# Patient Record
Sex: Female | Born: 1940 | Race: White | Hispanic: No | Marital: Single | Smoking: Former smoker
Health system: Southern US, Community
[De-identification: ages and names within clinical notes are randomized; demographics above are authoritative.]

## PROBLEM LIST (undated history)

## (undated) DIAGNOSIS — M8580 Other specified disorders of bone density and structure, unspecified site: Secondary | ICD-10-CM

## (undated) DIAGNOSIS — F329 Major depressive disorder, single episode, unspecified: Secondary | ICD-10-CM

## (undated) DIAGNOSIS — I10 Essential (primary) hypertension: Secondary | ICD-10-CM

## (undated) DIAGNOSIS — K219 Gastro-esophageal reflux disease without esophagitis: Secondary | ICD-10-CM

## (undated) DIAGNOSIS — C801 Malignant (primary) neoplasm, unspecified: Secondary | ICD-10-CM

## (undated) DIAGNOSIS — I639 Cerebral infarction, unspecified: Secondary | ICD-10-CM

## (undated) DIAGNOSIS — M1990 Unspecified osteoarthritis, unspecified site: Secondary | ICD-10-CM

## (undated) DIAGNOSIS — IMO0001 Reserved for inherently not codable concepts without codable children: Secondary | ICD-10-CM

## (undated) DIAGNOSIS — F32A Depression, unspecified: Secondary | ICD-10-CM

## (undated) DIAGNOSIS — D699 Hemorrhagic condition, unspecified: Secondary | ICD-10-CM

## (undated) DIAGNOSIS — R011 Cardiac murmur, unspecified: Secondary | ICD-10-CM

## (undated) HISTORY — PX: CATARACT EXTRACTION: SUR2

## (undated) HISTORY — PX: OTHER SURGICAL HISTORY: SHX169

## (undated) HISTORY — PX: BUNIONECTOMY: SHX129

## (undated) HISTORY — PX: CARDIAC CATHETERIZATION: SHX172

## (undated) HISTORY — PX: TONSILLECTOMY: SUR1361

---

## 1997-11-01 ENCOUNTER — Other Ambulatory Visit: Admission: RE | Admit: 1997-11-01 | Discharge: 1997-11-01 | Payer: Self-pay | Admitting: Obstetrics and Gynecology

## 1999-11-25 ENCOUNTER — Encounter: Payer: Self-pay | Admitting: Obstetrics and Gynecology

## 1999-11-25 ENCOUNTER — Encounter: Admission: RE | Admit: 1999-11-25 | Discharge: 1999-11-25 | Payer: Self-pay | Admitting: Obstetrics and Gynecology

## 2000-04-20 ENCOUNTER — Ambulatory Visit (HOSPITAL_COMMUNITY): Admission: RE | Admit: 2000-04-20 | Discharge: 2000-04-20 | Payer: Self-pay | Admitting: Gastroenterology

## 2001-09-13 ENCOUNTER — Other Ambulatory Visit: Admission: RE | Admit: 2001-09-13 | Discharge: 2001-09-13 | Payer: Self-pay | Admitting: Gynecology

## 2001-10-11 ENCOUNTER — Encounter: Payer: Self-pay | Admitting: Gynecology

## 2001-10-11 ENCOUNTER — Encounter: Admission: RE | Admit: 2001-10-11 | Discharge: 2001-10-11 | Payer: Self-pay | Admitting: Gynecology

## 2002-05-27 ENCOUNTER — Encounter: Payer: Self-pay | Admitting: Emergency Medicine

## 2002-05-27 ENCOUNTER — Emergency Department (HOSPITAL_COMMUNITY): Admission: EM | Admit: 2002-05-27 | Discharge: 2002-05-27 | Payer: Self-pay | Admitting: Emergency Medicine

## 2002-08-19 ENCOUNTER — Ambulatory Visit: Admission: RE | Admit: 2002-08-19 | Discharge: 2002-08-19 | Payer: Self-pay | Admitting: Family Medicine

## 2002-08-19 ENCOUNTER — Encounter: Payer: Self-pay | Admitting: Family Medicine

## 2002-08-29 ENCOUNTER — Other Ambulatory Visit: Admission: RE | Admit: 2002-08-29 | Discharge: 2002-08-29 | Payer: Self-pay | Admitting: Gynecology

## 2002-09-21 ENCOUNTER — Encounter: Payer: Self-pay | Admitting: Vascular Surgery

## 2002-09-21 ENCOUNTER — Ambulatory Visit (HOSPITAL_COMMUNITY): Admission: RE | Admit: 2002-09-21 | Discharge: 2002-09-21 | Payer: Self-pay | Admitting: Vascular Surgery

## 2003-10-09 ENCOUNTER — Other Ambulatory Visit: Admission: RE | Admit: 2003-10-09 | Discharge: 2003-10-09 | Payer: Self-pay | Admitting: Gynecology

## 2004-03-04 ENCOUNTER — Ambulatory Visit: Admission: RE | Admit: 2004-03-04 | Discharge: 2004-03-04 | Payer: Self-pay | Admitting: Family Medicine

## 2004-10-14 ENCOUNTER — Other Ambulatory Visit: Admission: RE | Admit: 2004-10-14 | Discharge: 2004-10-14 | Payer: Self-pay | Admitting: Gynecology

## 2005-10-06 ENCOUNTER — Other Ambulatory Visit: Admission: RE | Admit: 2005-10-06 | Discharge: 2005-10-06 | Payer: Self-pay | Admitting: Gynecology

## 2006-10-12 ENCOUNTER — Other Ambulatory Visit: Admission: RE | Admit: 2006-10-12 | Discharge: 2006-10-12 | Payer: Self-pay | Admitting: Gynecology

## 2009-12-21 ENCOUNTER — Encounter: Admission: RE | Admit: 2009-12-21 | Discharge: 2009-12-21 | Payer: Self-pay | Admitting: Gynecology

## 2010-11-27 ENCOUNTER — Other Ambulatory Visit: Payer: Self-pay | Admitting: Gynecology

## 2010-11-27 DIAGNOSIS — Z1231 Encounter for screening mammogram for malignant neoplasm of breast: Secondary | ICD-10-CM

## 2010-12-26 ENCOUNTER — Ambulatory Visit
Admission: RE | Admit: 2010-12-26 | Discharge: 2010-12-26 | Disposition: A | Discharge status: Home / Self Care | Payer: BC Managed Care – PPO | Source: Ambulatory Visit | Attending: Gynecology | Admitting: Gynecology

## 2010-12-26 DIAGNOSIS — Z1231 Encounter for screening mammogram for malignant neoplasm of breast: Secondary | ICD-10-CM

## 2010-12-31 ENCOUNTER — Other Ambulatory Visit: Payer: Self-pay | Admitting: Gynecology

## 2010-12-31 DIAGNOSIS — R928 Other abnormal and inconclusive findings on diagnostic imaging of breast: Secondary | ICD-10-CM

## 2011-01-20 ENCOUNTER — Ambulatory Visit
Admission: RE | Admit: 2011-01-20 | Discharge: 2011-01-20 | Disposition: A | Discharge status: Home / Self Care | Payer: BC Managed Care – PPO | Source: Ambulatory Visit | Attending: Gynecology | Admitting: Gynecology

## 2011-01-20 ENCOUNTER — Ambulatory Visit
Admission: RE | Admit: 2011-01-20 | Discharge: 2011-01-20 | Disposition: A | Discharge status: Home / Self Care | Payer: Medicare Other | Source: Ambulatory Visit | Attending: Gynecology | Admitting: Gynecology

## 2011-01-20 DIAGNOSIS — R928 Other abnormal and inconclusive findings on diagnostic imaging of breast: Secondary | ICD-10-CM

## 2012-01-12 ENCOUNTER — Other Ambulatory Visit: Payer: Self-pay | Admitting: Family Medicine

## 2012-01-12 DIAGNOSIS — Z1231 Encounter for screening mammogram for malignant neoplasm of breast: Secondary | ICD-10-CM

## 2012-01-13 ENCOUNTER — Ambulatory Visit
Admission: RE | Admit: 2012-01-13 | Discharge: 2012-01-13 | Disposition: A | Discharge status: Home / Self Care | Payer: Medicare Other | Source: Ambulatory Visit | Attending: Family Medicine | Admitting: Family Medicine

## 2012-01-13 DIAGNOSIS — Z1231 Encounter for screening mammogram for malignant neoplasm of breast: Secondary | ICD-10-CM

## 2012-12-15 ENCOUNTER — Other Ambulatory Visit: Payer: Self-pay

## 2012-12-15 DIAGNOSIS — Z1231 Encounter for screening mammogram for malignant neoplasm of breast: Secondary | ICD-10-CM

## 2013-01-13 ENCOUNTER — Ambulatory Visit
Admission: RE | Admit: 2013-01-13 | Discharge: 2013-01-13 | Disposition: A | Discharge status: Home / Self Care | Payer: Medicare Other | Source: Ambulatory Visit

## 2013-01-13 DIAGNOSIS — Z1231 Encounter for screening mammogram for malignant neoplasm of breast: Secondary | ICD-10-CM

## 2013-09-20 ENCOUNTER — Other Ambulatory Visit: Payer: Self-pay | Admitting: Orthopedic Surgery

## 2013-09-28 ENCOUNTER — Encounter (HOSPITAL_COMMUNITY): Payer: Self-pay | Admitting: Pharmacy Technician

## 2013-09-30 ENCOUNTER — Inpatient Hospital Stay (HOSPITAL_COMMUNITY)
Admission: RE | Admit: 2013-09-30 | Discharge: 2013-09-30 | Disposition: A | Discharge status: Home / Self Care | Payer: Medicare Other | Source: Ambulatory Visit

## 2013-09-30 ENCOUNTER — Encounter (HOSPITAL_COMMUNITY): Payer: Self-pay

## 2013-09-30 HISTORY — DX: Essential (primary) hypertension: I10

## 2013-09-30 HISTORY — DX: Malignant (primary) neoplasm, unspecified: C80.1

## 2013-09-30 HISTORY — DX: Other specified disorders of bone density and structure, unspecified site: M85.80

## 2013-09-30 HISTORY — DX: Cerebral infarction, unspecified: I63.9

## 2013-09-30 HISTORY — DX: Depression, unspecified: F32.A

## 2013-09-30 HISTORY — DX: Major depressive disorder, single episode, unspecified: F32.9

## 2013-09-30 NOTE — Pre-Procedure Instructions (Signed)
EKG, MEDICAL CLEARANCE AND OFFICE NOTE 09/05/13  DR. CYNTHIA WHITE ON PT'S CHART.

## 2013-10-03 ENCOUNTER — Encounter (HOSPITAL_COMMUNITY): Payer: Self-pay

## 2013-10-03 ENCOUNTER — Ambulatory Visit (HOSPITAL_COMMUNITY)
Admission: RE | Admit: 2013-10-03 | Discharge: 2013-10-03 | Disposition: A | Discharge status: Home / Self Care | Payer: Medicare Other | Source: Ambulatory Visit | Attending: Orthopedic Surgery | Admitting: Orthopedic Surgery

## 2013-10-03 ENCOUNTER — Encounter (HOSPITAL_COMMUNITY)
Admission: RE | Admit: 2013-10-03 | Discharge: 2013-10-03 | Disposition: A | Discharge status: Home / Self Care | Payer: Medicare Other | Source: Ambulatory Visit | Attending: Orthopedic Surgery | Admitting: Orthopedic Surgery

## 2013-10-03 DIAGNOSIS — Z01818 Encounter for other preprocedural examination: Secondary | ICD-10-CM | POA: Insufficient documentation

## 2013-10-03 DIAGNOSIS — Z01812 Encounter for preprocedural laboratory examination: Secondary | ICD-10-CM | POA: Insufficient documentation

## 2013-10-03 HISTORY — DX: Unspecified osteoarthritis, unspecified site: M19.90

## 2013-10-03 HISTORY — DX: Cardiac murmur, unspecified: R01.1

## 2013-10-03 LAB — BASIC METABOLIC PANEL
BUN: 18 mg/dL (ref 6–23)
CHLORIDE: 102 meq/L (ref 96–112)
CO2: 28 mEq/L (ref 19–32)
CREATININE: 0.88 mg/dL (ref 0.50–1.10)
Calcium: 9.6 mg/dL (ref 8.4–10.5)
GFR, EST AFRICAN AMERICAN: 74 mL/min — AB (ref >90)
GFR, EST NON AFRICAN AMERICAN: 64 mL/min — AB (ref >90)
Glucose, Bld: 100 mg/dL — ABNORMAL HIGH (ref 70–99)
POTASSIUM: 3.4 meq/L — AB (ref 3.7–5.3)
Sodium: 142 mEq/L (ref 137–147)

## 2013-10-03 LAB — CBC
HEMATOCRIT: 39.6 % (ref 36.0–46.0)
HEMOGLOBIN: 13.5 g/dL (ref 12.0–15.0)
MCH: 33.3 pg (ref 26.0–34.0)
MCHC: 34.1 g/dL (ref 30.0–36.0)
MCV: 97.5 fL (ref 78.0–100.0)
Platelets: 213 10*3/uL (ref 150–400)
RBC: 4.06 MIL/uL (ref 3.87–5.11)
RDW: 12.2 % (ref 11.5–15.5)
WBC: 4.6 10*3/uL (ref 4.0–10.5)

## 2013-10-03 NOTE — Patient Instructions (Signed)
      Your procedure is scheduled on:  10/05/13  [facility]  Report to [place] at  1030     AM.  Call this number if you have problems the morning of surgery: [phone]        Do not eat food  :After Midnight. Tuesday NIGHT       May have Clear liquids until 0700 AM  Wednesday morning then nothing   Take these medicines the morning of surgery with A SIP OF WATER: Citalopram   .  Contacts, dentures or partial plates, or metal hairpins  can not be worn to surgery. Your family will be responsible for glasses, dentures, hearing aides while you are in surgery  Leave suitcase in the car. After surgery it may be brought to your room.  For patients admitted to the hospital, checkout time is 11:00 AM day of  discharge.                DO NOT WEAR JEWELRY, LOTIONS, POWDERS, OR PERFUMES.  WOMEN-- DO NOT SHAVE LEGS OR UNDERARMS FOR 48 HOURS BEFORE SHOWERS. MEN MAY SHAVE FACE.  Patients discharged the day of surgery will not be allowed to drive home. IF going home the day of surgery, you must have a driver and someone to stay with you for the first 24 hours  Name and phone number of your driver:    Corinne Ports  daughter                                                                    Please read over the following fact sheets that you were given:  Incentive Spirometry Sheet                   FAILURE TO FOLLOW THESE INSTRUCTIONS MAY RESULT IN  CANCELLATION   OF YOUR SURGERY                                                  Patient Signature _____________________________

## 2013-10-04 DIAGNOSIS — S83249A Other tear of medial meniscus, current injury, unspecified knee, initial encounter: Secondary | ICD-10-CM | POA: Diagnosis present

## 2013-10-04 NOTE — H&P (Signed)
  Ebony [name] is a 73 y.o. female who presents with right knee pain.  HPI- . Knee Pain: Patient presents with knee pain involving the  right knee. Onset of the symptoms was several months ago. Inciting event: none known. Current symptoms include giving out, pain located medially, popping sensation, stiffness and swelling. Pain is aggravated by kneeling, lateral movements, pivoting, rising after sitting and squatting.  Patient has had no prior knee problems. Evaluation to date: MRI: abnormal medial meniscal tear. Treatment to date: brace which is somewhat effective.  Past Medical History  Diagnosis Date  . Stroke     TIA 2004- PT IS ON PLAVIX  . Hypertension   . Depression   . Osteopenia   . Right knee meniscal tear   . Cancer     BASAL CELL CA  . Dyslipidemia     STABLE - OFF MEDICATION  . Heart murmur   . Arthritis     Past Surgical History  Procedure Laterality Date  . Mohs surgery on nose 01/2007, mohs on face 08/2012    . Cardiac catheterization    . Tonsillectomy      Prior to Admission medications   Medication Sig Start Date End Date Taking? Authorizing Provider  citalopram (CELEXA) 40 MG tablet Take 40 mg by mouth every morning.    Historical Provider, MD  clopidogrel (PLAVIX) 75 MG tablet Take 75 mg by mouth daily with breakfast.    Historical Provider, MD  triamterene-hydrochlorothiazide (DYAZIDE) 37.5-25 MG per capsule Take 1 capsule by mouth every morning.    Historical Provider, MD   KNEE EXAM antalgic gait, soft tissue tenderness over medial joint line, no effusion, positive pivot-shift, collateral ligaments intact  Physical Examination: General appearance - alert, well appearing, and in no distress Mental status - alert, oriented to person, place, and time Chest - clear to auscultation, no wheezes, rales or rhonchi, symmetric air entry Heart - normal rate, regular rhythm, normal S1, S2, no murmurs, rubs, clicks or gallops Abdomen - soft, nontender,  nondistended, no masses or organomegaly Neurological - alert, oriented, normal speech, no focal findings or movement disorder noted Musculoskeletal - no joint tenderness, deformity or swelling   Asessment/Plan--- Right knee medial meniscal tear- - Plan left knee arthroscopy with meniscal debridement. Procedure risks and potential comps discussed with patient who elects to proceed. Goals are decreased pain and increased function with a high likelihood of achieving both

## 2013-10-05 ENCOUNTER — Encounter (HOSPITAL_COMMUNITY)
Admission: RE | Disposition: A | Discharge status: Home / Self Care | Payer: Self-pay | Source: Ambulatory Visit | Attending: Orthopedic Surgery

## 2013-10-05 ENCOUNTER — Ambulatory Visit (HOSPITAL_COMMUNITY): Payer: Medicare Other | Admitting: Anesthesiology

## 2013-10-05 ENCOUNTER — Encounter (HOSPITAL_COMMUNITY): Payer: Self-pay | Admitting: *Deleted

## 2013-10-05 ENCOUNTER — Ambulatory Visit (HOSPITAL_COMMUNITY)
Admission: RE | Admit: 2013-10-05 | Discharge: 2013-10-05 | Disposition: A | Discharge status: Home / Self Care | Payer: Medicare Other | Source: Ambulatory Visit | Attending: Orthopedic Surgery | Admitting: Orthopedic Surgery

## 2013-10-05 ENCOUNTER — Encounter (HOSPITAL_COMMUNITY): Payer: Medicare Other | Admitting: Anesthesiology

## 2013-10-05 DIAGNOSIS — S83249A Other tear of medial meniscus, current injury, unspecified knee, initial encounter: Secondary | ICD-10-CM

## 2013-10-05 DIAGNOSIS — E785 Hyperlipidemia, unspecified: Secondary | ICD-10-CM | POA: Insufficient documentation

## 2013-10-05 DIAGNOSIS — F329 Major depressive disorder, single episode, unspecified: Secondary | ICD-10-CM | POA: Insufficient documentation

## 2013-10-05 DIAGNOSIS — R011 Cardiac murmur, unspecified: Secondary | ICD-10-CM | POA: Insufficient documentation

## 2013-10-05 DIAGNOSIS — F3289 Other specified depressive episodes: Secondary | ICD-10-CM | POA: Insufficient documentation

## 2013-10-05 DIAGNOSIS — M949 Disorder of cartilage, unspecified: Secondary | ICD-10-CM

## 2013-10-05 DIAGNOSIS — IMO0002 Reserved for concepts with insufficient information to code with codable children: Secondary | ICD-10-CM | POA: Insufficient documentation

## 2013-10-05 DIAGNOSIS — M899 Disorder of bone, unspecified: Secondary | ICD-10-CM | POA: Insufficient documentation

## 2013-10-05 DIAGNOSIS — X58XXXA Exposure to other specified factors, initial encounter: Secondary | ICD-10-CM | POA: Insufficient documentation

## 2013-10-05 DIAGNOSIS — Z7902 Long term (current) use of antithrombotics/antiplatelets: Secondary | ICD-10-CM | POA: Insufficient documentation

## 2013-10-05 DIAGNOSIS — Z8673 Personal history of transient ischemic attack (TIA), and cerebral infarction without residual deficits: Secondary | ICD-10-CM | POA: Insufficient documentation

## 2013-10-05 DIAGNOSIS — I10 Essential (primary) hypertension: Secondary | ICD-10-CM | POA: Insufficient documentation

## 2013-10-05 DIAGNOSIS — Z79899 Other long term (current) drug therapy: Secondary | ICD-10-CM | POA: Insufficient documentation

## 2013-10-05 HISTORY — PX: KNEE ARTHROSCOPY: SHX127

## 2013-10-05 SURGERY — ARTHROSCOPY, KNEE
Anesthesia: General | Site: Knee | Laterality: Right

## 2013-10-05 MED ORDER — ACETAMINOPHEN 10 MG/ML IV SOLN
1000.0000 mg | Freq: Once | INTRAVENOUS | Status: AC
  Administered 2013-10-05: 1000 mg via INTRAVENOUS
  Filled 2013-10-05: qty 100

## 2013-10-05 MED ORDER — FENTANYL CITRATE 0.05 MG/ML IJ SOLN
INTRAMUSCULAR | Status: DC | PRN
  Administered 2013-10-05: 25 ug via INTRAVENOUS
  Administered 2013-10-05 (×2): 50 ug via INTRAVENOUS
  Administered 2013-10-05: 25 ug via INTRAVENOUS
  Administered 2013-10-05: 50 ug via INTRAVENOUS

## 2013-10-05 MED ORDER — ONDANSETRON HCL 4 MG/2ML IJ SOLN
INTRAMUSCULAR | Status: AC
  Filled 2013-10-05: qty 2

## 2013-10-05 MED ORDER — LACTATED RINGERS IV SOLN
INTRAVENOUS | Status: DC
  Administered 2013-10-05: 1000 mL via INTRAVENOUS
  Administered 2013-10-05: 14:00:00 via INTRAVENOUS

## 2013-10-05 MED ORDER — CEFAZOLIN SODIUM-DEXTROSE 2-3 GM-% IV SOLR
INTRAVENOUS | Status: AC
  Filled 2013-10-05: qty 50

## 2013-10-05 MED ORDER — HYDROCODONE-ACETAMINOPHEN 5-325 MG PO TABS
1.0000 | ORAL_TABLET | Freq: Four times a day (QID) | ORAL | Status: DC | PRN
  Administered 2013-10-05: 1 via ORAL
  Filled 2013-10-05: qty 1

## 2013-10-05 MED ORDER — DEXAMETHASONE SODIUM PHOSPHATE 10 MG/ML IJ SOLN
10.0000 mg | Freq: Once | INTRAMUSCULAR | Status: AC
  Administered 2013-10-05: 10 mg via INTRAVENOUS

## 2013-10-05 MED ORDER — FENTANYL CITRATE 0.05 MG/ML IJ SOLN
INTRAMUSCULAR | Status: AC
  Filled 2013-10-05: qty 2

## 2013-10-05 MED ORDER — MEPERIDINE HCL 50 MG/ML IJ SOLN: 6.2500 mg | INTRAMUSCULAR | Status: DC | PRN

## 2013-10-05 MED ORDER — PROPOFOL 10 MG/ML IV BOLUS
INTRAVENOUS | Status: AC
  Filled 2013-10-05: qty 20

## 2013-10-05 MED ORDER — LIDOCAINE HCL (CARDIAC) 20 MG/ML IV SOLN
INTRAVENOUS | Status: AC
  Filled 2013-10-05: qty 5

## 2013-10-05 MED ORDER — HYDROMORPHONE HCL PF 1 MG/ML IJ SOLN
INTRAMUSCULAR | Status: AC
  Filled 2013-10-05: qty 1

## 2013-10-05 MED ORDER — DEXAMETHASONE SODIUM PHOSPHATE 10 MG/ML IJ SOLN
INTRAMUSCULAR | Status: AC
  Filled 2013-10-05: qty 1

## 2013-10-05 MED ORDER — HYDROMORPHONE HCL PF 1 MG/ML IJ SOLN
0.2500 mg | INTRAMUSCULAR | Status: DC | PRN
  Administered 2013-10-05 (×3): 0.5 mg via INTRAVENOUS

## 2013-10-05 MED ORDER — LIDOCAINE HCL (CARDIAC) 20 MG/ML IV SOLN
INTRAVENOUS | Status: DC | PRN
  Administered 2013-10-05: 50 mg via INTRAVENOUS

## 2013-10-05 MED ORDER — HYDROCODONE-ACETAMINOPHEN 5-325 MG PO TABS: 1.0000 | ORAL_TABLET | Freq: Four times a day (QID) | ORAL | Status: DC | PRN

## 2013-10-05 MED ORDER — SODIUM CHLORIDE 0.9 % IR SOLN
Status: DC | PRN
  Administered 2013-10-05: 9000 mL

## 2013-10-05 MED ORDER — GLYCOPYRROLATE 0.2 MG/ML IJ SOLN
INTRAMUSCULAR | Status: AC
  Filled 2013-10-05: qty 1

## 2013-10-05 MED ORDER — PROPOFOL 10 MG/ML IV BOLUS
INTRAVENOUS | Status: DC | PRN
  Administered 2013-10-05: 150 mg via INTRAVENOUS

## 2013-10-05 MED ORDER — PROMETHAZINE HCL 25 MG/ML IJ SOLN: 6.2500 mg | INTRAMUSCULAR | Status: DC | PRN

## 2013-10-05 MED ORDER — CEFAZOLIN SODIUM-DEXTROSE 2-3 GM-% IV SOLR
2.0000 g | INTRAVENOUS | Status: AC
  Administered 2013-10-05: 2 g via INTRAVENOUS

## 2013-10-05 MED ORDER — SODIUM CHLORIDE 0.9 % IV SOLN: INTRAVENOUS | Status: DC

## 2013-10-05 MED ORDER — BUPIVACAINE-EPINEPHRINE 0.25% -1:200000 IJ SOLN
INTRAMUSCULAR | Status: DC | PRN
  Administered 2013-10-05: 20 mL

## 2013-10-05 MED ORDER — BUPIVACAINE-EPINEPHRINE PF 0.25-1:200000 % IJ SOLN
INTRAMUSCULAR | Status: AC
  Filled 2013-10-05: qty 30

## 2013-10-05 MED ORDER — GLYCOPYRROLATE 0.2 MG/ML IJ SOLN
INTRAMUSCULAR | Status: DC | PRN
  Administered 2013-10-05: 0.2 mg via INTRAVENOUS

## 2013-10-05 MED ORDER — ONDANSETRON HCL 4 MG/2ML IJ SOLN
INTRAMUSCULAR | Status: DC | PRN
  Administered 2013-10-05: 4 mg via INTRAVENOUS

## 2013-10-05 SURGICAL SUPPLY — 23 items
BLADE 4.2CUDA (BLADE) ×3 IMPLANT
CLOTH BEACON ORANGE TIMEOUT ST (SAFETY) ×3 IMPLANT
COUNTER NEEDLE 20 DBL MAG RED (NEEDLE) ×3 IMPLANT
CUFF TOURN SGL QUICK 34 (TOURNIQUET CUFF) ×3
CUFF TRNQT CYL 34X4X40X1 (TOURNIQUET CUFF) ×1 IMPLANT
DRAPE U-SHAPE 47X51 STRL (DRAPES) ×3 IMPLANT
DRSG EMULSION OIL 3X3 NADH (GAUZE/BANDAGES/DRESSINGS) ×3 IMPLANT
DURAPREP 26ML APPLICATOR (WOUND CARE) ×3 IMPLANT
GLOVE BIO SURGEON STRL SZ8 (GLOVE) ×3 IMPLANT
GLOVE BIOGEL PI IND STRL 8 (GLOVE) ×1 IMPLANT
GLOVE BIOGEL PI INDICATOR 8 (GLOVE) ×8
GOWN STRL REUS W/TWL LRG LVL3 (GOWN DISPOSABLE) ×5 IMPLANT
MANIFOLD NEPTUNE II (INSTRUMENTS) ×3 IMPLANT
PACK ARTHROSCOPY WL (CUSTOM PROCEDURE TRAY) ×3 IMPLANT
PACK ICE MAXI GEL EZY WRAP (MISCELLANEOUS) ×9 IMPLANT
PADDING CAST COTTON 6X4 STRL (CAST SUPPLIES) ×6 IMPLANT
POSITIONER SURGICAL ARM (MISCELLANEOUS) ×1 IMPLANT
SET ARTHROSCOPY TUBING (MISCELLANEOUS) ×3
SET ARTHROSCOPY TUBING LN (MISCELLANEOUS) ×1 IMPLANT
SUT ETHILON 4 0 PS 2 18 (SUTURE) ×3 IMPLANT
TOWEL OR 17X26 10 PK STRL BLUE (TOWEL DISPOSABLE) ×3 IMPLANT
WAND 90 DEG TURBOVAC W/CORD (SURGICAL WAND) ×2 IMPLANT
WRAP KNEE MAXI GEL POST OP (GAUZE/BANDAGES/DRESSINGS) ×3 IMPLANT

## 2013-10-05 NOTE — Anesthesia Preprocedure Evaluation (Addendum)
Anesthesia Evaluation  Patient identified by MRN, date of birth, ID band Patient awake    Reviewed: Allergy & Precautions, H&P , NPO status , Patient's Chart, lab work & pertinent test results  Airway Mallampati: II TM Distance: >3 FB Neck ROM: Full    Dental  (+) Dental Advisory Given   Pulmonary neg pulmonary ROS, former smoker,  breath sounds clear to auscultation        Cardiovascular hypertension, Pt. on medications negative cardio ROS  + Valvular Problems/Murmurs Rhythm:Regular Rate:Normal     Neuro/Psych PSYCHIATRIC DISORDERS Depression TIA   GI/Hepatic negative GI ROS, Neg liver ROS,   Endo/Other  negative endocrine ROS  Renal/GU negative Renal ROS     Musculoskeletal negative musculoskeletal ROS (+)   Abdominal   Peds  Hematology negative hematology ROS (+)   Anesthesia Other Findings   Reproductive/Obstetrics negative OB ROS                          Anesthesia Physical Anesthesia Plan  ASA: II  Anesthesia Plan: General   Post-op Pain Management:    Induction: Intravenous  Airway Management Planned: LMA  Additional Equipment:   Intra-op Plan:   Post-operative Plan: Extubation in OR  Informed Consent: I have reviewed the patients History and Physical, chart, labs and discussed the procedure including the risks, benefits and alternatives for the proposed anesthesia with the patient or authorized representative who has indicated his/her understanding and acceptance.   Dental advisory given  Plan Discussed with: CRNA  Anesthesia Plan Comments:         Anesthesia Quick Evaluation

## 2013-10-05 NOTE — Interval H&P Note (Signed)
History and Physical Interval Note:  10/05/2013 11:54 AM  Ebony [name]  has presented today for surgery, with the diagnosis of Right Knee Medial Mensical Tear  The various methods of treatment have been discussed with the patient and family. After consideration of risks, benefits and other options for treatment, the patient has consented to  Procedure(s): RIGHT KNEE ARTHROSCOPY WITH DEBRIDEMENT (Right) as a surgical intervention .  The patient's history has been reviewed, patient examined, no change in status, stable for surgery.  I have reviewed the patient's chart and labs.  Questions were answered to the patient's satisfaction.     [name]

## 2013-10-05 NOTE — Preoperative (Signed)
Beta Blockers   Reason not to administer Beta Blockers:Not Applicable 

## 2013-10-05 NOTE — Op Note (Signed)
Preoperative diagnosis-  Right knee medial meniscal tear  Postoperative diagnosis Right- knee medial and lateral meniscal tears plus Right medial femoral chondral defect  Procedure- Right knee arthroscopy with medial and lateral meniscal debridement and chondroplasty   Surgeon- Dione Plover. Dea Bitting, MD  Anesthesia-General  EBL-  minimal Complications- None  Condition- PACU - hemodynamically stable.  Brief clinical note- -Ebony [name] is a 73 y.o.  female with a several month history of right knee pain and mechanical symptoms. Exam and history suggested medial meniscal tear confirmed by MRI. The patient presents now for arthroscopy and debridement   Procedure in detail -       After successful administration of General anesthetic, a tourmiquet is placed high on the Right  thigh and the Right lower extremity is prepped and draped in the usual sterile fashion. Time out is performed by the surgical team. Standard superomedial and inferolateral portal sites are marked and incisions made with an 11 blade. The inflow cannula is passed through the superomedial portal and camera through the inferolateral portal and inflow is initiated. Arthroscopic visualization proceeds.      The undersurface of the patella and trochlea are visualized and there is grade II chondromalacia both surfaces without any chondral defects. The medial and lateral gutters are visualized and there are  no loose bodies. Flexion and valgus force is applied to the knee and the medial compartment is entered. A spinal needle is passed into the joint through the site marked for the inferomedial portal. A small incision is made and the dilator passed into the joint. The findings for the medial compartment are medial meniscal tear posterior horn and 2 x 2 cm chondral defect medial femoral condyle . The tear is debrided to a stable base with baskets and a shaver and sealed off with the Arthrocare. The shaver is used to debride the unstable  cartilage to a stable bony base in a 1 x 1 cm area and cartilaginous base with stable edges for the remainder of the defect. It is probed and found to be stable. The area of exposed bone is abraded with the shaver.    The intercondylar notch is visualized and the ACL appears normal. The lateral compartment is entered and the findings are tear of the anterior horn and body of the lateral meniscus . The tear is debrided to a stable base with  a shaver and sealed off with the Arthrocare. There are no chondral defects.     The joint is again inspected and there are no other tears, defects or loose bodies identified. The arthroscopic equipment is then removed from the inferior portals which are closed with interrupted 4-0 nylon. 20 ml of .25% Marcaine with epinephrine are injected through the inflow cannula and the cannula is then removed and the portal closed with nylon. The incisions are cleaned and dried and a bulky sterile dressing is applied. The patient is then awakened and transported to recovery in stable condition.   10/05/2013, 1:20 PM

## 2013-10-05 NOTE — Discharge Instructions (Signed)

## 2013-10-05 NOTE — Transfer of Care (Signed)
Immediate Anesthesia Transfer of Care Note  Patient: Ebony [name]  Procedure(s) Performed: Procedure(s): RIGHT KNEE ARTHROSCOPY WITH medial and lateral DEBRIDEMENT chrondroplasty (Right)  Patient Location: PACU  Anesthesia Type:General  Level of Consciousness: awake, alert  and oriented  Airway & Oxygen Therapy: Patient Spontanous Breathing and Patient connected to face mask oxygen  Post-op Assessment: Report given to PACU RN and Post -op Vital signs reviewed and stable  Post vital signs: Reviewed and stable  Complications: No apparent anesthesia complications

## 2013-10-05 NOTE — Anesthesia Postprocedure Evaluation (Signed)
Anesthesia Post Note  Patient: Ebony [name]  Procedure(s) Performed: Procedure(s) (LRB): RIGHT KNEE ARTHROSCOPY WITH medial and lateral DEBRIDEMENT chrondroplasty (Right)  Anesthesia type: General  Patient location: PACU  Post pain: Pain level controlled  Post assessment: Post-op Vital signs reviewed  Last Vitals: BP 142/50  Pulse 69  Temp(Src) 36.3 C (Oral)  Resp 16  SpO2 96%  Post vital signs: Reviewed  Level of consciousness: sedated  Complications: No apparent anesthesia complications

## 2013-10-06 ENCOUNTER — Encounter (HOSPITAL_COMMUNITY): Payer: Self-pay | Admitting: Orthopedic Surgery

## 2013-10-12 ENCOUNTER — Encounter (HOSPITAL_COMMUNITY): Payer: Self-pay | Admitting: Orthopedic Surgery

## 2014-03-07 ENCOUNTER — Other Ambulatory Visit: Payer: Self-pay

## 2014-03-07 DIAGNOSIS — Z1231 Encounter for screening mammogram for malignant neoplasm of breast: Secondary | ICD-10-CM

## 2014-03-22 ENCOUNTER — Ambulatory Visit
Admission: RE | Admit: 2014-03-22 | Discharge: 2014-03-22 | Disposition: A | Discharge status: Home / Self Care | Payer: 59 | Source: Ambulatory Visit

## 2014-03-22 DIAGNOSIS — Z1231 Encounter for screening mammogram for malignant neoplasm of breast: Secondary | ICD-10-CM

## 2014-08-01 ENCOUNTER — Ambulatory Visit (INDEPENDENT_AMBULATORY_CARE_PROVIDER_SITE_OTHER): Payer: 59

## 2014-08-01 ENCOUNTER — Encounter: Payer: Self-pay | Admitting: Podiatry

## 2014-08-01 ENCOUNTER — Ambulatory Visit (INDEPENDENT_AMBULATORY_CARE_PROVIDER_SITE_OTHER): Payer: 59 | Admitting: Podiatry

## 2014-08-01 VITALS — BP 76/47 | HR 85 | Resp 16 | Ht 66.0 in | Wt 185.0 lb

## 2014-08-01 DIAGNOSIS — M7751 Other enthesopathy of right foot: Secondary | ICD-10-CM

## 2014-08-01 DIAGNOSIS — M778 Other enthesopathies, not elsewhere classified: Secondary | ICD-10-CM

## 2014-08-01 DIAGNOSIS — M1990 Unspecified osteoarthritis, unspecified site: Secondary | ICD-10-CM

## 2014-08-01 DIAGNOSIS — M779 Enthesopathy, unspecified: Secondary | ICD-10-CM

## 2014-08-01 MED ORDER — METHYLPREDNISOLONE (PAK) 4 MG PO TABS: ORAL_TABLET | ORAL | Status: DC

## 2014-08-01 NOTE — Progress Notes (Signed)
   Subjective:    Patient ID: Ebony [name], female    DOB: 1941-04-14, 74 y.o.   MRN: [id number]  HPI Comments: Right foot pain , the whole foot hurts all over. Had knee surgery back in march and it has not done well the pain went all over. i did have a fracture in the right foot years and years ago, i am not sure if that has healed correctly.  Dull ache , when walking it just hurts   Foot Pain      Review of Systems  All other systems reviewed and are negative.      Objective:   Physical Exam: I have reviewed her past medical history medications allergy surgery social history and review of systems area ulcers are strongly palpable bilateral. Neurologic sensorium is intact per Semmes-Weinstein monofilament. Deep tendon reflexes are intact bilateral muscle strength +5 over 5 dorsiflexors plantar flexors and inverters and everters all intrinsic musculature is intact. Orthopedic evaluation demonstrates all joints distal to the ankle have full range of motion without crepitation that she does have pain on range of motion of the lesser metatarsophalangeal joints on end range of motion. 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        Assessment & Plan:  Assessment: Capsulitis second and third metatarsophalangeal joints right foot.  Plan: Started her in a Medrol Dosepak and was injected with Kenalog and local anesthetic at the second and third metatarsophalangeal joint area. We discussed appropriate shoe gear stress exercises ice therapy and I'll follow-up with her in 1 month.

## 2014-08-22 ENCOUNTER — Encounter: Payer: Self-pay | Admitting: Podiatry

## 2014-08-22 ENCOUNTER — Ambulatory Visit (INDEPENDENT_AMBULATORY_CARE_PROVIDER_SITE_OTHER): Payer: 59 | Admitting: Podiatry

## 2014-08-22 VITALS — BP 109/69 | HR 96 | Resp 16

## 2014-08-22 DIAGNOSIS — M1990 Unspecified osteoarthritis, unspecified site: Secondary | ICD-10-CM

## 2014-08-22 DIAGNOSIS — M779 Enthesopathy, unspecified: Principal | ICD-10-CM

## 2014-08-22 DIAGNOSIS — M7751 Other enthesopathy of right foot: Secondary | ICD-10-CM

## 2014-08-22 DIAGNOSIS — M778 Other enthesopathies, not elsewhere classified: Secondary | ICD-10-CM

## 2014-08-22 NOTE — Progress Notes (Signed)
She presents today for follow-up of her capsulitis to the second and third metatarsophalangeal joints of the right foot. She states that she is approximately 50% improved.  Objective: I'll signs are stable she is alert and oriented 3 pulses are palpable right lower extremity. He still has end range of motion to the second and third metatarsophalangeal joints but the swelling to the plantar aspect of the metatarsophalangeal joints has resolved considerably. She no longer has pain on palpation to these areas.  Assessment: Resolving capsulitis second and third metatarsophalangeal joints right foot.  Plan: I reinjected with dexamethasone today at the second metatarsal and second intermetatarsal space of the right foot. She tolerated this well and I will follow-up with her next month for reevaluation. May need to consider orthotics at that time.

## 2014-09-19 ENCOUNTER — Ambulatory Visit: Payer: 59 | Admitting: Podiatry

## 2014-11-22 DIAGNOSIS — K219 Gastro-esophageal reflux disease without esophagitis: Secondary | ICD-10-CM | POA: Diagnosis present

## 2014-11-22 DIAGNOSIS — I10 Essential (primary) hypertension: Secondary | ICD-10-CM | POA: Diagnosis present

## 2015-01-18 DIAGNOSIS — N1831 Chronic kidney disease, stage 3a: Secondary | ICD-10-CM | POA: Diagnosis present

## 2015-03-13 ENCOUNTER — Encounter: Payer: Self-pay | Admitting: Podiatry

## 2015-03-13 ENCOUNTER — Ambulatory Visit (INDEPENDENT_AMBULATORY_CARE_PROVIDER_SITE_OTHER): Payer: Medicare Other | Admitting: Podiatry

## 2015-03-13 VITALS — BP 138/93 | HR 67 | Resp 16

## 2015-03-13 DIAGNOSIS — G629 Polyneuropathy, unspecified: Secondary | ICD-10-CM | POA: Diagnosis not present

## 2015-03-13 DIAGNOSIS — M7751 Other enthesopathy of right foot: Secondary | ICD-10-CM

## 2015-03-13 DIAGNOSIS — M778 Other enthesopathies, not elsewhere classified: Secondary | ICD-10-CM

## 2015-03-13 DIAGNOSIS — M779 Enthesopathy, unspecified: Principal | ICD-10-CM

## 2015-03-13 NOTE — Progress Notes (Signed)
She presents today complaining of crowding of her toes on her right foot with pain to the second metatarsophalangeal joint of the right foot. She states that she gets some numbness bent knee second and third toes as well. She states that the toes feel cold.  Objective: Vital signs are stable she is alert and oriented 3 no acute distress noted. Pulses are strong palpable bilateral. Neurologic sensorium is intact per Semmes-Weinstein monofilament. Deep tendon reflexes are intact bilateral and muscle strength was 5 over 5 dorsiflexion plantar flexors and inverters everters all into the musculature is intact. Orthopedic evaluation demonstrates all joints distally for range of motion crepitation. 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 She also has pain on palpation on end range of motion of the second digit of the right foot. This is capsulitis.  Assessment: Capsulitis second metatarsophalangeal joint right foot. 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  Plan: Discussed etiology pathology conservative versus surgical therapies. At this point I expressed to her that without bunion surgery she will remain with crowded toes. I did inject 2 mg of dexamethasone to the point and maximal tenderness at the second metatarsophalangeal joint right foot. I will follow up with her in approximately a month.

## 2015-03-14 ENCOUNTER — Other Ambulatory Visit: Payer: Self-pay

## 2015-03-14 DIAGNOSIS — Z803 Family history of malignant neoplasm of breast: Secondary | ICD-10-CM

## 2015-03-14 DIAGNOSIS — Z1231 Encounter for screening mammogram for malignant neoplasm of breast: Secondary | ICD-10-CM

## 2015-03-27 ENCOUNTER — Ambulatory Visit
Admission: RE | Admit: 2015-03-27 | Discharge: 2015-03-27 | Disposition: A | Discharge status: Home / Self Care | Payer: Medicare Other | Source: Ambulatory Visit

## 2015-03-27 DIAGNOSIS — Z803 Family history of malignant neoplasm of breast: Secondary | ICD-10-CM

## 2015-03-27 DIAGNOSIS — Z1231 Encounter for screening mammogram for malignant neoplasm of breast: Secondary | ICD-10-CM

## 2015-04-12 ENCOUNTER — Encounter: Payer: Self-pay | Admitting: Podiatry

## 2015-04-12 ENCOUNTER — Ambulatory Visit (INDEPENDENT_AMBULATORY_CARE_PROVIDER_SITE_OTHER): Payer: Medicare Other | Admitting: Podiatry

## 2015-04-12 VITALS — BP 110/60 | HR 80 | Resp 12

## 2015-04-12 DIAGNOSIS — G5761 Lesion of plantar nerve, right lower limb: Secondary | ICD-10-CM

## 2015-04-12 NOTE — Progress Notes (Signed)
She presents today for follow-up of her painful foot right. The last time she was in we diagnosed her with capsulitis of the second metatarsophalangeal joint and placed in injection. She states that that part of the foot doesn't hurt she's having more pain now here she points to the lateral aspect of the foot between the third and fourth digits. She states that she has sharp radiating pains in this area. She states it bothers her with shoe gear. She states that she has no longer pain on range of motion of the second metatarsophalangeal joint.  Objective: Vital signs are stable alert and oriented 3. Pulses are palpable. She has good range of motion of the second metatarsophalangeal joint with no pain on palpation or end range of motion. She does have pain on palpation of the third interdigital space however with a palpable Mulder's click right.  Assessment: Neuroma third interdigital space right foot resolving capsulitis second metatarsophalangeal joint right foot.  Plan: Injected Kenalog and local anesthesia to the point of maximal tenderness third interspace right today discussed the possible need for dehydrated alcohol injections. Follow-up with her in 1 month to 6 weeks.  Roselind Messier DPM

## 2015-05-10 ENCOUNTER — Encounter: Payer: Self-pay | Admitting: Podiatry

## 2015-05-10 ENCOUNTER — Ambulatory Visit (INDEPENDENT_AMBULATORY_CARE_PROVIDER_SITE_OTHER): Payer: Medicare Other | Admitting: Podiatry

## 2015-05-10 DIAGNOSIS — M7751 Other enthesopathy of right foot: Secondary | ICD-10-CM | POA: Diagnosis not present

## 2015-05-10 DIAGNOSIS — M778 Other enthesopathies, not elsewhere classified: Secondary | ICD-10-CM

## 2015-05-10 DIAGNOSIS — G5761 Lesion of plantar nerve, right lower limb: Secondary | ICD-10-CM

## 2015-05-10 DIAGNOSIS — M779 Enthesopathy, unspecified: Secondary | ICD-10-CM

## 2015-05-10 NOTE — Progress Notes (Signed)
She presents today for follow-up of capsulitis and neuritis right foot. She states that sometimes the foot hurts worse than others (today is really not painful.  Objective: vital signs are stable she is alert and oriented 3. She has pain on palpation to the third interdigital space of the right foot.    assessment: Neuroma third interdigital space right foot.  Plan: injected her first dose of dehydrated alcohol today. Follow up with her in 1 month.   Roselind Messier DPM

## 2015-05-29 ENCOUNTER — Ambulatory Visit (INDEPENDENT_AMBULATORY_CARE_PROVIDER_SITE_OTHER): Payer: Medicare Other | Admitting: Podiatry

## 2015-05-29 ENCOUNTER — Encounter: Payer: Self-pay | Admitting: Podiatry

## 2015-05-29 DIAGNOSIS — G5761 Lesion of plantar nerve, right lower limb: Secondary | ICD-10-CM

## 2015-05-29 NOTE — Progress Notes (Signed)
She presents today for follow-up of neuroma to the third interdigital space of the right foot. She states it is feeling better but is not well.  Objective: Vital signs are stable she is alert and oriented 3. Pulses are strongly palpable. She has a palpable Mulder's click to the third interdigital space of the right foot. Pulses are strongly palpable.  Assessment: Neuroma third interdigital space right foot.  Plan: Injected her second dose of dehydrated alcohol to the third interdigital space of the right foot today. I will follow-up with her in 1 month.

## 2015-06-28 ENCOUNTER — Ambulatory Visit: Payer: Medicare Other | Admitting: Podiatry

## 2015-07-10 ENCOUNTER — Ambulatory Visit: Payer: Medicare Other | Admitting: Podiatry

## 2015-10-31 ENCOUNTER — Ambulatory Visit: Payer: Self-pay | Admitting: Orthopedic Surgery

## 2015-11-16 NOTE — Patient Instructions (Addendum)
YOUR PROCEDURE IS SCHEDULED ON : 11/29/15  REPORT TO [place] MAIN ENTRANCE FOLLOW SIGNS TO EAST ELEVATOR - GO TO 3rd FLOOR CHECK IN AT 3 EAST NURSES STATION (SHORT STAY) AT:  1:45 PM  CALL THIS NUMBER IF YOU HAVE PROBLEMS THE MORNING OF SURGERY [phone]  REMEMBER:ONLY 1 PER PERSON MAY GO TO SHORT STAY WITH YOU TO GET READY THE MORNING OF YOUR SURGERY  DO NOT EAT FOOD  AFTER MIDNIGHT  MAY HAVE CLEAR LIQUIDS UNTIL 9:45 AM  TAKE THESE MEDICINES THE MORNING OF SURGERY:  CELEXA / TRAMADOL IF NEEDED / MAY TAKE ZYRTEC AND/OR FLONASE NASAL SPRAY IF NEEDED  CLEAR LIQUID DIET  Foods Allowed                                                                     Foods Excluded  Coffee and tea, regular and decaf                             liquids that you cannot  Plain Jell-O in any flavor                                             see through such as: Fruit ices (not with fruit pulp)                                     milk, soups, orange juice  Iced Popsicles                                                         All solid food Carbonated beverages, regular and diet                                    Cranberry, grape and apple juices Sports drinks like Gatorade Lightly seasoned clear broth or consume(fat free) Sugar, honey syrup _____________________________________________________________________    YOU MAY NOT HAVE ANY METAL ON YOUR BODY INCLUDING HAIR PINS AND PIERCING'S. DO NOT WEAR JEWELRY, MAKEUP, LOTIONS, POWDERS OR PERFUMES. DO NOT WEAR NAIL POLISH. DO NOT SHAVE 48 HRS PRIOR TO SURGERY. MEN MAY SHAVE FACE AND NECK.  DO NOT [place]. [facility] IS NOT RESPONSIBLE FOR VALUABLES.  CONTACTS, DENTURES OR PARTIALS MAY NOT BE WORN TO SURGERY. LEAVE SUITCASE IN CAR. CAN BE BROUGHT TO ROOM AFTER SURGERY.  PATIENTS DISCHARGED THE DAY OF SURGERY WILL NOT BE ALLOWED TO DRIVE HOME.  PLEASE READ OVER THE FOLLOWING INSTRUCTION  SHEETS _________________________________________________________________________________                                          [place] -  PREPARING FOR SURGERY  Before surgery, you can play an important role.  Because skin is not sterile, your skin needs to be as free of germs as possible.  You can reduce the number of germs on your skin by washing with CHG (chlorahexidine gluconate) soap before surgery.  CHG is an antiseptic cleaner which kills germs and bonds with the skin to continue killing germs even after washing. Please DO NOT use if you have an allergy to CHG or antibacterial soaps.  If your skin becomes reddened/irritated stop using the CHG and inform your nurse when you arrive at Short Stay. Do not shave (including legs and underarms) for at least 48 hours prior to the first CHG shower.  You may shave your face. Please follow these instructions carefully:   1.  Shower with CHG Soap the night before surgery and the  morning of Surgery.   2.  If you choose to wash your hair, wash your hair first as usual with your  normal  Shampoo.   3.  After you shampoo, rinse your hair and body thoroughly to remove the  shampoo.                                         4.  Use CHG as you would any other liquid soap.  You can apply chg directly  to the skin and wash . Gently wash with scrungie or clean wascloth    5.  Apply the CHG Soap to your body ONLY FROM THE NECK DOWN.   Do not use on open                           Wound or open sores. Avoid contact with eyes, ears mouth and genitals (private parts).                        Genitals (private parts) with your normal soap.              6.  Wash thoroughly, paying special attention to the area where your surgery  will be performed.   7.  Thoroughly rinse your body with warm water from the neck down.   8.  DO NOT shower/wash with your normal soap after using and rinsing off  the CHG Soap .                9.  Pat yourself dry with a clean  towel.             10.  Wear clean night clothes to bed after shower             11.  Place clean sheets on your bed the night of your first shower and do not  sleep with pets.  Day of Surgery : Do not apply any lotions/deodorants the morning of surgery.  Please wear clean clothes to the hospital/surgery center.  FAILURE TO FOLLOW THESE INSTRUCTIONS MAY RESULT IN THE CANCELLATION OF YOUR SURGERY    PATIENT SIGNATURE_________________________________  ______________________________________________________________________     Ebony [name]  An incentive spirometer is a tool that can help keep your lungs clear and active. This tool measures how well you are filling your lungs with each breath. Taking long deep breaths may help reverse or decrease the chance of developing breathing (pulmonary) problems (especially infection)  following:  A long period of time when you are unable to move or be active. BEFORE THE PROCEDURE   If the spirometer includes an indicator to show your best effort, your nurse or respiratory therapist will set it to a desired goal.  If possible, sit up straight or lean slightly forward. Try not to slouch.  Hold the incentive spirometer in an upright position. INSTRUCTIONS FOR USE   Sit on the edge of your bed if possible, or sit up as far as you can in bed or on a chair.  Hold the incentive spirometer in an upright position.  Breathe out normally.  Place the mouthpiece in your mouth and seal your lips tightly around it.  Breathe in slowly and as deeply as possible, raising the piston or the ball toward the top of the column.  Hold your breath for 3-5 seconds or for as long as possible. Allow the piston or ball to fall to the bottom of the column.  Remove the mouthpiece from your mouth and breathe out normally.  Rest for a few seconds and repeat Steps 1 through 7 at least 10 times every 1-2 hours when you are awake. Take your time and take a few  normal breaths between deep breaths.  The spirometer may include an indicator to show your best effort. Use the indicator as a goal to work toward during each repetition.  After each set of 10 deep breaths, practice coughing to be sure your lungs are clear. If you have an incision (the cut made at the time of surgery), support your incision when coughing by placing a pillow or rolled up towels firmly against it. Once you are able to get out of bed, walk around indoors and cough well. You may stop using the incentive spirometer when instructed by your caregiver.  RISKS AND COMPLICATIONS  Take your time so you do not get dizzy or light-headed.  If you are in pain, you may need to take or ask for pain medication before doing incentive spirometry. It is harder to take a deep breath if you are having pain. AFTER USE  Rest and breathe slowly and easily.  It can be helpful to keep track of a log of your progress. Your caregiver can provide you with a simple table to help with this. If you are using the spirometer at home, follow these instructions: [place] IF:   You are having difficultly using the spirometer.  You have trouble using the spirometer as often as instructed.  Your pain medication is not giving enough relief while using the spirometer.  You develop fever of 100.5 F (38.1 C) or higher. SEEK IMMEDIATE MEDICAL CARE IF:   You cough up bloody sputum that had not been present before.  You develop fever of 102 F (38.9 C) or greater.  You develop worsening pain at or near the incision site. MAKE SURE YOU:   Understand these instructions.  Will watch your condition.  Will get help right away if you are not doing well or get worse. Document Released: 11/17/2006 Document Revised: 09/29/2011 Document Reviewed: 01/18/2007 Exit[employer] Patient Information 2014 [employer], [place].   ________________________________________________________________________  WHAT IS A BLOOD  TRANSFUSION? Blood Transfusion Information  A transfusion is the replacement of blood or some of its parts. Blood is made up of multiple cells which provide different functions.  Red blood cells carry oxygen and are used for blood loss replacement.  White blood cells fight against infection.  Platelets control  bleeding.  Plasma helps clot blood.  Other blood products are available for specialized needs, such as hemophilia or other clotting disorders. BEFORE THE TRANSFUSION  Who gives blood for transfusions?   Healthy volunteers who are fully evaluated to make sure their blood is safe. This is blood bank blood. Transfusion therapy is the safest it has ever been in the practice of medicine. Before blood is taken from a donor, a complete history is taken to make sure that person has no history of diseases nor engages in risky social behavior (examples are intravenous drug use or sexual activity with multiple partners). The donor's travel history is screened to minimize risk of transmitting infections, such as malaria. The donated blood is tested for signs of infectious diseases, such as HIV and hepatitis. The blood is then tested to be sure it is compatible with you in order to minimize the chance of a transfusion reaction. If you or a relative donates blood, this is often done in anticipation of surgery and is not appropriate for emergency situations. It takes many days to process the donated blood. RISKS AND COMPLICATIONS Although transfusion therapy is very safe and saves many lives, the main dangers of transfusion include:   Getting an infectious disease.  Developing a transfusion reaction. This is an allergic reaction to something in the blood you were given. Every precaution is taken to prevent this. The decision to have a blood transfusion has been considered carefully by your caregiver before blood is given. Blood is not given unless the benefits outweigh the risks. AFTER THE  TRANSFUSION  Right after receiving a blood transfusion, you will usually feel much better and more energetic. This is especially true if your red blood cells have gotten low (anemic). The transfusion raises the level of the red blood cells which carry oxygen, and this usually causes an energy increase.  The nurse administering the transfusion will monitor you carefully for complications. HOME CARE INSTRUCTIONS  No special instructions are needed after a transfusion. You may find your energy is better. Speak with your caregiver about any limitations on activity for underlying diseases you may have. SEEK MEDICAL CARE IF:   Your condition is not improving after your transfusion.  You develop redness or irritation at the intravenous (IV) site. SEEK IMMEDIATE MEDICAL CARE IF:  Any of the following symptoms occur over the next 12 hours:  Shaking chills.  You have a temperature by mouth above 102 F (38.9 C), not controlled by medicine.  Chest, back, or muscle pain.  People around you feel you are not acting correctly or are confused.  Shortness of breath or difficulty breathing.  Dizziness and fainting.  You get a rash or develop hives.  You have a decrease in urine output.  Your urine turns a dark color or changes to pink, red, or brown. Any of the following symptoms occur over the next 10 days:  You have a temperature by mouth above 102 F (38.9 C), not controlled by medicine.  Shortness of breath.  Weakness after normal activity.  The white part of the eye turns yellow (jaundice).  You have a decrease in the amount of urine or are urinating less often.  Your urine turns a dark color or changes to pink, red, or brown. Document Released: 07/04/2000 Document Revised: 09/29/2011 Document Reviewed: 02/21/2008 [facility] Patient Information 2014 [place], [place].  _______________________________________________________________________

## 2015-11-18 ENCOUNTER — Ambulatory Visit: Payer: Self-pay | Admitting: Orthopedic Surgery

## 2015-11-18 NOTE — H&P (Signed)
TOTAL KNEE ADMISSION H&P  Patient is being admitted for right total knee arthroplasty.  Subjective:  Chief Complaint:right knee pain.  HPI: Ebony [name], 75 y.o. female, has a history of pain and functional disability in the right knee due to arthritis and has failed non-surgical conservative treatments for greater than 12 weeks to includeNSAID's and/or analgesics, corticosteriod injections, viscosupplementation injections, flexibility and strengthening excercises, use of assistive devices, weight reduction as appropriate and activity modification.  Onset of symptoms was gradual, starting 5 years ago with gradually worsening course since that time. The patient noted prior procedures on the knee to include  arthroscopy on the right knee(s).  Patient currently rates pain in the right knee(s) at 10 out of 10 with activity. Patient has night pain, worsening of pain with activity and weight bearing, pain that interferes with activities of daily living, pain with passive range of motion, crepitus and joint swelling.  Patient has evidence of subchondral cysts, subchondral sclerosis, periarticular osteophytes and joint space narrowing by imaging studies. There is no active infection.  Patient Active Problem List   Diagnosis Date Noted  . Acute medial meniscal tear 10/04/2013   Past Medical History  Diagnosis Date  . Stroke ([place])     TIA 2004- PT IS ON PLAVIX  . Hypertension   . Depression   . Osteopenia   . Right knee meniscal tear   . Cancer ([place])     BASAL CELL CA  . Dyslipidemia     STABLE - OFF MEDICATION  . Heart murmur   . Arthritis     Past Surgical History  Procedure Laterality Date  . Mohs surgery on nose 01/2007, mohs on face 08/2012    . Cardiac catheterization    . Tonsillectomy    . Knee arthroscopy Right 10/05/2013    Procedure: RIGHT KNEE ARTHROSCOPY WITH medial and lateral DEBRIDEMENT chrondroplasty;  Surgeon: [name], MD;  Location: [facility];  Service: Orthopedics;   Laterality: Right;     (Not in a hospital admission) Allergies  Allergen Reactions  . Other Anaphylaxis    Muscle Relaxer-Unsure which one  . Codeine Nausea And Vomiting  . Sulfa Antibiotics Other (See Comments)    Red streaks on nose- ? cellulitis    Social History  Substance Use Topics  . Smoking status: Former Smoker -- 1.00 packs/day for 10 years    Types: Cigarettes    Quit date: 10/04/1971  . Smokeless tobacco: Never Used  . Alcohol Use: Yes     Comment: QUIT [place] ALCOHOL    No family history on file.   Review of Systems  Constitutional: Negative.   Eyes: Negative.   Respiratory: Positive for shortness of breath.   Cardiovascular: Negative.   Gastrointestinal: Negative.   Genitourinary: Negative.   Musculoskeletal: Positive for back pain and joint pain.  Skin: Negative.   Neurological: Positive for dizziness.  Endo/Heme/Allergies: Negative.   Psychiatric/Behavioral: Negative.     Objective:  Physical Exam  Vitals reviewed. Constitutional: She is oriented to person, place, and time. She appears well-developed and well-nourished.  HENT:  Head: Normocephalic and atraumatic.  Eyes: Conjunctivae and EOM are normal. Pupils are equal, round, and reactive to light.  Neck: Normal range of motion. Neck supple.  Cardiovascular: Normal rate, regular rhythm and intact distal pulses.   Respiratory: Effort normal and breath sounds normal.  GI: Soft. Bowel sounds are normal. She exhibits no distension.  Genitourinary:  deferred  Musculoskeletal:  Right knee: She exhibits decreased range of motion, swelling, effusion and bony tenderness. Tenderness found. Lateral joint line tenderness noted.  Neurological: She is alert and oriented to person, place, and time. She has normal reflexes.  Skin: Skin is warm and dry.  Psychiatric: She has a normal mood and affect. Her behavior is normal. Judgment and thought content normal.    Vital signs in last 24  hours: @VSRANGES @  Labs:   Estimated body mass index is 29.87 kg/(m^2) as calculated from the following:   Height as of 08/01/14: 5\' 6"  (1.676 m).   Weight as of 08/01/14: 83.915 kg (185 lb).   Imaging Review Plain radiographs demonstrate severe degenerative joint disease of the right knee(s). The overall alignment issignificant valgus. The bone quality appears to be good for age and reported activity level.  Assessment/Plan:  End stage arthritis, right knee   The patient history, physical examination, clinical judgment of the provider and imaging studies are consistent with end stage degenerative joint disease of the right knee(s) and total knee arthroplasty is deemed medically necessary. The treatment options including medical management, injection therapy arthroscopy and arthroplasty were discussed at length. The risks and benefits of total knee arthroplasty were presented and reviewed. The risks due to aseptic loosening, infection, stiffness, patella tracking problems, thromboembolic complications and other imponderables were discussed. The patient acknowledged the explanation, agreed to proceed with the plan and consent was signed. Patient is being admitted for inpatient treatment for surgery, pain control, PT, OT, prophylactic antibiotics, VTE prophylaxis, progressive ambulation and ADL's and discharge planning. The patient is planning to be discharged home with home health services

## 2015-11-19 ENCOUNTER — Encounter (HOSPITAL_COMMUNITY)
Admission: RE | Admit: 2015-11-19 | Discharge: 2015-11-19 | Disposition: A | Discharge status: Home / Self Care | Payer: Medicare Other | Source: Ambulatory Visit | Attending: Orthopedic Surgery | Admitting: Orthopedic Surgery

## 2015-11-19 ENCOUNTER — Encounter (HOSPITAL_COMMUNITY): Payer: Self-pay

## 2015-11-19 DIAGNOSIS — M179 Osteoarthritis of knee, unspecified: Secondary | ICD-10-CM | POA: Diagnosis not present

## 2015-11-19 DIAGNOSIS — Z01812 Encounter for preprocedural laboratory examination: Secondary | ICD-10-CM | POA: Diagnosis not present

## 2015-11-19 HISTORY — DX: Hemorrhagic condition, unspecified: D69.9

## 2015-11-19 HISTORY — DX: Reserved for inherently not codable concepts without codable children: IMO0001

## 2015-11-19 HISTORY — DX: Gastro-esophageal reflux disease without esophagitis: K21.9

## 2015-11-19 LAB — CBC
HCT: 41.4 % (ref 36.0–46.0)
HEMOGLOBIN: 14.1 g/dL (ref 12.0–15.0)
MCH: 32.9 pg (ref 26.0–34.0)
MCHC: 34.1 g/dL (ref 30.0–36.0)
MCV: 96.5 fL (ref 78.0–100.0)
Platelets: 235 10*3/uL (ref 150–400)
RBC: 4.29 MIL/uL (ref 3.87–5.11)
RDW: 12.5 % (ref 11.5–15.5)
WBC: 6.3 10*3/uL (ref 4.0–10.5)

## 2015-11-19 LAB — SURGICAL PCR SCREEN
MRSA, PCR: NEGATIVE
Staphylococcus aureus: NEGATIVE

## 2015-11-19 LAB — BASIC METABOLIC PANEL
ANION GAP: 10 (ref 5–15)
BUN: 21 mg/dL — ABNORMAL HIGH (ref 6–20)
CHLORIDE: 102 mmol/L (ref 101–111)
CO2: 32 mmol/L (ref 22–32)
Calcium: 10 mg/dL (ref 8.9–10.3)
Creatinine, Ser: 1.11 mg/dL — ABNORMAL HIGH (ref 0.44–1.00)
GFR calc non Af Amer: 48 mL/min — ABNORMAL LOW (ref >60)
GFR, EST AFRICAN AMERICAN: 55 mL/min — AB (ref >60)
Glucose, Bld: 114 mg/dL — ABNORMAL HIGH (ref 65–99)
Potassium: 4.3 mmol/L (ref 3.5–5.1)
SODIUM: 144 mmol/L (ref 135–145)

## 2015-11-19 LAB — PROTIME-INR
INR: 1 (ref 0.00–1.49)
PROTHROMBIN TIME: 13.4 s (ref 11.6–15.2)

## 2015-11-19 LAB — ABO/RH: ABO/RH(D): O POS

## 2015-11-27 ENCOUNTER — Ambulatory Visit: Payer: Medicare Other | Admitting: Podiatry

## 2015-11-29 ENCOUNTER — Encounter (HOSPITAL_COMMUNITY): Payer: Self-pay | Admitting: *Deleted

## 2015-11-29 ENCOUNTER — Encounter (HOSPITAL_COMMUNITY)
Admission: RE | Disposition: A | Discharge status: Home Health | Payer: Self-pay | Source: Ambulatory Visit | Attending: Orthopedic Surgery

## 2015-11-29 ENCOUNTER — Inpatient Hospital Stay (HOSPITAL_COMMUNITY)
Admission: RE | Admit: 2015-11-29 | Discharge: 2015-11-30 | DRG: 470 | Disposition: A | Discharge status: Home Health | Payer: Medicare Other | Source: Ambulatory Visit | Attending: Orthopedic Surgery | Admitting: Orthopedic Surgery

## 2015-11-29 ENCOUNTER — Inpatient Hospital Stay (HOSPITAL_COMMUNITY): Payer: Medicare Other

## 2015-11-29 ENCOUNTER — Inpatient Hospital Stay (HOSPITAL_COMMUNITY): Payer: Medicare Other | Admitting: Anesthesiology

## 2015-11-29 DIAGNOSIS — Z885 Allergy status to narcotic agent status: Secondary | ICD-10-CM

## 2015-11-29 DIAGNOSIS — I10 Essential (primary) hypertension: Secondary | ICD-10-CM | POA: Diagnosis present

## 2015-11-29 DIAGNOSIS — M1711 Unilateral primary osteoarthritis, right knee: Secondary | ICD-10-CM | POA: Diagnosis present

## 2015-11-29 DIAGNOSIS — Z888 Allergy status to other drugs, medicaments and biological substances status: Secondary | ICD-10-CM

## 2015-11-29 DIAGNOSIS — M8580 Other specified disorders of bone density and structure, unspecified site: Secondary | ICD-10-CM | POA: Diagnosis present

## 2015-11-29 DIAGNOSIS — Z87891 Personal history of nicotine dependence: Secondary | ICD-10-CM | POA: Diagnosis not present

## 2015-11-29 DIAGNOSIS — Z85828 Personal history of other malignant neoplasm of skin: Secondary | ICD-10-CM

## 2015-11-29 DIAGNOSIS — E785 Hyperlipidemia, unspecified: Secondary | ICD-10-CM | POA: Diagnosis present

## 2015-11-29 DIAGNOSIS — Z882 Allergy status to sulfonamides status: Secondary | ICD-10-CM

## 2015-11-29 DIAGNOSIS — Z09 Encounter for follow-up examination after completed treatment for conditions other than malignant neoplasm: Secondary | ICD-10-CM

## 2015-11-29 DIAGNOSIS — F329 Major depressive disorder, single episode, unspecified: Secondary | ICD-10-CM | POA: Diagnosis present

## 2015-11-29 DIAGNOSIS — Z8673 Personal history of transient ischemic attack (TIA), and cerebral infarction without residual deficits: Secondary | ICD-10-CM | POA: Diagnosis not present

## 2015-11-29 HISTORY — PX: KNEE ARTHROPLASTY: SHX992

## 2015-11-29 LAB — TYPE AND SCREEN
ABO/RH(D): O POS
ANTIBODY SCREEN: NEGATIVE

## 2015-11-29 SURGERY — ARTHROPLASTY, KNEE, TOTAL, USING IMAGELESS COMPUTER-ASSISTED NAVIGATION
Anesthesia: Regional | Site: Knee | Laterality: Right

## 2015-11-29 MED ORDER — POVIDONE-IODINE 10 % EX SWAB: 2.0000 "application " | Freq: Once | CUTANEOUS | Status: DC

## 2015-11-29 MED ORDER — CHLORHEXIDINE GLUCONATE 4 % EX LIQD: 60.0000 mL | Freq: Once | CUTANEOUS | Status: DC

## 2015-11-29 MED ORDER — BUPIVACAINE-EPINEPHRINE (PF) 0.25% -1:200000 IJ SOLN
INTRAMUSCULAR | Status: AC
  Filled 2015-11-29: qty 30

## 2015-11-29 MED ORDER — LACTATED RINGERS IV SOLN
INTRAVENOUS | Status: DC
  Administered 2015-11-29 (×2): via INTRAVENOUS

## 2015-11-29 MED ORDER — HYDROMORPHONE HCL 1 MG/ML IJ SOLN
INTRAMUSCULAR | Status: AC
  Filled 2015-11-29: qty 1

## 2015-11-29 MED ORDER — FLUTICASONE PROPIONATE 50 MCG/ACT NA SUSP
2.0000 | Freq: Every day | NASAL | Status: DC | PRN
  Filled 2015-11-29: qty 16

## 2015-11-29 MED ORDER — ALUM & MAG HYDROXIDE-SIMETH 200-200-20 MG/5ML PO SUSP: 30.0000 mL | ORAL | Status: DC | PRN

## 2015-11-29 MED ORDER — DOCUSATE SODIUM 100 MG PO CAPS
100.0000 mg | ORAL_CAPSULE | Freq: Two times a day (BID) | ORAL | Status: DC
  Administered 2015-11-29 – 2015-11-30 (×2): 100 mg via ORAL
  Filled 2015-11-29 (×2): qty 1

## 2015-11-29 MED ORDER — SORBITOL 70 % SOLN: 30.0000 mL | Freq: Every day | Status: DC | PRN

## 2015-11-29 MED ORDER — CEFAZOLIN SODIUM-DEXTROSE 2-4 GM/100ML-% IV SOLN
2.0000 g | Freq: Four times a day (QID) | INTRAVENOUS | Status: AC
  Administered 2015-11-29 – 2015-11-30 (×2): 2 g via INTRAVENOUS
  Filled 2015-11-29 (×2): qty 100

## 2015-11-29 MED ORDER — CLOPIDOGREL BISULFATE 75 MG PO TABS
75.0000 mg | ORAL_TABLET | Freq: Every day | ORAL | Status: DC
  Administered 2015-11-30: 75 mg via ORAL
  Filled 2015-11-29: qty 1

## 2015-11-29 MED ORDER — ALPRAZOLAM 0.5 MG PO TABS: 0.5000 mg | ORAL_TABLET | Freq: Every evening | ORAL | Status: DC | PRN

## 2015-11-29 MED ORDER — SODIUM CHLORIDE 0.9 % IR SOLN
Status: DC | PRN
  Administered 2015-11-29: 1000 mL

## 2015-11-29 MED ORDER — HYDROCODONE-ACETAMINOPHEN 5-325 MG PO TABS
1.0000 | ORAL_TABLET | ORAL | Status: DC | PRN
  Administered 2015-11-29 – 2015-11-30 (×4): 2 via ORAL
  Filled 2015-11-29 (×4): qty 2

## 2015-11-29 MED ORDER — MENTHOL 3 MG MT LOZG: 1.0000 | LOZENGE | OROMUCOSAL | Status: DC | PRN

## 2015-11-29 MED ORDER — FENTANYL CITRATE (PF) 100 MCG/2ML IJ SOLN
INTRAMUSCULAR | Status: AC
  Filled 2015-11-29: qty 2

## 2015-11-29 MED ORDER — ISOPROPYL ALCOHOL 70 % SOLN
Status: DC | PRN
  Administered 2015-11-29: 1 via TOPICAL

## 2015-11-29 MED ORDER — FENTANYL CITRATE (PF) 100 MCG/2ML IJ SOLN
50.0000 ug | INTRAMUSCULAR | Status: AC | PRN
  Administered 2015-11-29 (×2): 50 ug via INTRAVENOUS
  Administered 2015-11-29: 100 ug via INTRAVENOUS

## 2015-11-29 MED ORDER — ONDANSETRON HCL 4 MG/2ML IJ SOLN
INTRAMUSCULAR | Status: AC
  Filled 2015-11-29: qty 2

## 2015-11-29 MED ORDER — MAGNESIUM CITRATE PO SOLN: 1.0000 | Freq: Once | ORAL | Status: DC | PRN

## 2015-11-29 MED ORDER — DEXAMETHASONE SODIUM PHOSPHATE 10 MG/ML IJ SOLN
10.0000 mg | Freq: Once | INTRAMUSCULAR | Status: AC
  Administered 2015-11-30: 10 mg via INTRAVENOUS
  Filled 2015-11-29: qty 1

## 2015-11-29 MED ORDER — DEXAMETHASONE SODIUM PHOSPHATE 10 MG/ML IJ SOLN
INTRAMUSCULAR | Status: AC
  Filled 2015-11-29: qty 1

## 2015-11-29 MED ORDER — SODIUM CHLORIDE 0.9 % IJ SOLN
INTRAMUSCULAR | Status: DC | PRN
  Administered 2015-11-29: 30 mL

## 2015-11-29 MED ORDER — PROPOFOL 10 MG/ML IV BOLUS
INTRAVENOUS | Status: DC | PRN
  Administered 2015-11-29: 160 mg via INTRAVENOUS

## 2015-11-29 MED ORDER — ROPIVACAINE HCL 5 MG/ML IJ SOLN
INTRAMUSCULAR | Status: DC | PRN
  Administered 2015-11-29: 30 mL

## 2015-11-29 MED ORDER — METOCLOPRAMIDE HCL 5 MG PO TABS: 5.0000 mg | ORAL_TABLET | Freq: Three times a day (TID) | ORAL | Status: DC | PRN

## 2015-11-29 MED ORDER — SUCCINYLCHOLINE CHLORIDE 20 MG/ML IJ SOLN
INTRAMUSCULAR | Status: DC | PRN
  Administered 2015-11-29: 100 mg via INTRAVENOUS

## 2015-11-29 MED ORDER — MECLIZINE HCL 12.5 MG PO TABS
12.5000 mg | ORAL_TABLET | Freq: Four times a day (QID) | ORAL | Status: DC | PRN
  Filled 2015-11-29: qty 1

## 2015-11-29 MED ORDER — ACETAMINOPHEN 325 MG PO TABS: 650.0000 mg | ORAL_TABLET | Freq: Four times a day (QID) | ORAL | Status: DC | PRN

## 2015-11-29 MED ORDER — PHENOL 1.4 % MT LIQD: 1.0000 | OROMUCOSAL | Status: DC | PRN

## 2015-11-29 MED ORDER — METHOCARBAMOL 500 MG PO TABS
500.0000 mg | ORAL_TABLET | Freq: Four times a day (QID) | ORAL | Status: DC | PRN
  Administered 2015-11-29 – 2015-11-30 (×2): 500 mg via ORAL
  Filled 2015-11-29 (×2): qty 1

## 2015-11-29 MED ORDER — FENTANYL CITRATE (PF) 100 MCG/2ML IJ SOLN
INTRAMUSCULAR | Status: DC | PRN
  Administered 2015-11-29 (×2): 50 ug via INTRAVENOUS

## 2015-11-29 MED ORDER — ACETAMINOPHEN 650 MG RE SUPP: 650.0000 mg | Freq: Four times a day (QID) | RECTAL | Status: DC | PRN

## 2015-11-29 MED ORDER — TRANEXAMIC ACID 1000 MG/10ML IV SOLN
1000.0000 mg | INTRAVENOUS | Status: AC
  Administered 2015-11-29: 1000 mg via INTRAVENOUS
  Filled 2015-11-29: qty 10

## 2015-11-29 MED ORDER — CEFAZOLIN SODIUM-DEXTROSE 2-4 GM/100ML-% IV SOLN
2.0000 g | INTRAVENOUS | Status: AC
  Administered 2015-11-29: 2 g via INTRAVENOUS
  Filled 2015-11-29: qty 100

## 2015-11-29 MED ORDER — PROPOFOL 10 MG/ML IV BOLUS
INTRAVENOUS | Status: AC
  Filled 2015-11-29: qty 20

## 2015-11-29 MED ORDER — HYDROGEN PEROXIDE 3 % EX SOLN
CUTANEOUS | Status: DC | PRN
  Administered 2015-11-29: 1

## 2015-11-29 MED ORDER — DIPHENHYDRAMINE HCL 12.5 MG/5ML PO ELIX: 12.5000 mg | ORAL_SOLUTION | ORAL | Status: DC | PRN

## 2015-11-29 MED ORDER — DEXAMETHASONE SODIUM PHOSPHATE 10 MG/ML IJ SOLN
INTRAMUSCULAR | Status: DC | PRN
  Administered 2015-11-29: 10 mg via INTRAVENOUS

## 2015-11-29 MED ORDER — SODIUM CHLORIDE 0.9 % IJ SOLN
INTRAMUSCULAR | Status: AC
  Filled 2015-11-29: qty 50

## 2015-11-29 MED ORDER — ASPIRIN EC 81 MG PO TBEC
81.0000 mg | DELAYED_RELEASE_TABLET | Freq: Two times a day (BID) | ORAL | Status: DC
  Administered 2015-11-30: 81 mg via ORAL
  Filled 2015-11-29: qty 1

## 2015-11-29 MED ORDER — ACETAMINOPHEN 10 MG/ML IV SOLN
INTRAVENOUS | Status: AC
  Filled 2015-11-29: qty 100

## 2015-11-29 MED ORDER — ACETAMINOPHEN 10 MG/ML IV SOLN
1000.0000 mg | INTRAVENOUS | Status: AC
  Administered 2015-11-29: 1000 mg via INTRAVENOUS
  Filled 2015-11-29: qty 100

## 2015-11-29 MED ORDER — LACTATED RINGERS IV SOLN
INTRAVENOUS | Status: DC | PRN
  Administered 2015-11-29 (×2): via INTRAVENOUS

## 2015-11-29 MED ORDER — TRIAMTERENE-HCTZ 37.5-25 MG PO CAPS
1.0000 | ORAL_CAPSULE | Freq: Every day | ORAL | Status: DC
  Administered 2015-11-30: 1 via ORAL
  Filled 2015-11-29: qty 1

## 2015-11-29 MED ORDER — METOCLOPRAMIDE HCL 5 MG/ML IJ SOLN: 5.0000 mg | Freq: Three times a day (TID) | INTRAMUSCULAR | Status: DC | PRN

## 2015-11-29 MED ORDER — ONDANSETRON HCL 4 MG/2ML IJ SOLN: 4.0000 mg | Freq: Four times a day (QID) | INTRAMUSCULAR | Status: DC | PRN

## 2015-11-29 MED ORDER — ONDANSETRON HCL 4 MG PO TABS: 4.0000 mg | ORAL_TABLET | Freq: Four times a day (QID) | ORAL | Status: DC | PRN

## 2015-11-29 MED ORDER — SENNA 8.6 MG PO TABS
2.0000 | ORAL_TABLET | Freq: Every day | ORAL | Status: DC
  Administered 2015-11-29: 17.2 mg via ORAL
  Filled 2015-11-29: qty 2

## 2015-11-29 MED ORDER — METHOCARBAMOL 1000 MG/10ML IJ SOLN
500.0000 mg | Freq: Four times a day (QID) | INTRAVENOUS | Status: DC | PRN
  Filled 2015-11-29: qty 5

## 2015-11-29 MED ORDER — LIDOCAINE HCL (CARDIAC) 20 MG/ML IV SOLN
INTRAVENOUS | Status: AC
Start: 2015-11-29 — End: 2015-11-29
  Filled 2015-11-29: qty 5

## 2015-11-29 MED ORDER — BUPIVACAINE-EPINEPHRINE 0.25% -1:200000 IJ SOLN
INTRAMUSCULAR | Status: DC | PRN
  Administered 2015-11-29: 30 mL

## 2015-11-29 MED ORDER — HYDROMORPHONE HCL 1 MG/ML IJ SOLN: 0.5000 mg | INTRAMUSCULAR | Status: DC | PRN

## 2015-11-29 MED ORDER — ONDANSETRON HCL 4 MG/2ML IJ SOLN
INTRAMUSCULAR | Status: DC | PRN
  Administered 2015-11-29: 4 mg via INTRAVENOUS

## 2015-11-29 MED ORDER — LIDOCAINE HCL (CARDIAC) 20 MG/ML IV SOLN
INTRAVENOUS | Status: DC | PRN
  Administered 2015-11-29: 100 mg via INTRAVENOUS

## 2015-11-29 MED ORDER — TRANEXAMIC ACID 1000 MG/10ML IV SOLN
1000.0000 mg | Freq: Once | INTRAVENOUS | Status: AC
  Administered 2015-11-29: 1000 mg via INTRAVENOUS
  Filled 2015-11-29: qty 10

## 2015-11-29 MED ORDER — SODIUM CHLORIDE 0.9 % IV SOLN: INTRAVENOUS | Status: DC

## 2015-11-29 MED ORDER — KETOROLAC TROMETHAMINE 30 MG/ML IJ SOLN
INTRAMUSCULAR | Status: DC | PRN
  Administered 2015-11-29: 30 mg

## 2015-11-29 MED ORDER — KETOROLAC TROMETHAMINE 30 MG/ML IJ SOLN
INTRAMUSCULAR | Status: AC
  Filled 2015-11-29: qty 1

## 2015-11-29 MED ORDER — CEFAZOLIN SODIUM-DEXTROSE 2-4 GM/100ML-% IV SOLN
INTRAVENOUS | Status: AC
  Filled 2015-11-29: qty 100

## 2015-11-29 MED ORDER — SODIUM CHLORIDE 0.9 % IV SOLN
INTRAVENOUS | Status: DC
  Administered 2015-11-29 (×2): via INTRAVENOUS

## 2015-11-29 MED ORDER — LORATADINE 10 MG PO TABS
10.0000 mg | ORAL_TABLET | Freq: Every day | ORAL | Status: DC
  Filled 2015-11-29: qty 1

## 2015-11-29 MED ORDER — HYDROMORPHONE HCL 1 MG/ML IJ SOLN
0.2500 mg | INTRAMUSCULAR | Status: DC | PRN
  Administered 2015-11-29 (×4): 0.5 mg via INTRAVENOUS

## 2015-11-29 MED ORDER — CITALOPRAM HYDROBROMIDE 20 MG PO TABS
20.0000 mg | ORAL_TABLET | Freq: Every day | ORAL | Status: DC
  Administered 2015-11-30: 20 mg via ORAL
  Filled 2015-11-29: qty 1

## 2015-11-29 MED ORDER — KETOROLAC TROMETHAMINE 15 MG/ML IJ SOLN
7.5000 mg | Freq: Four times a day (QID) | INTRAMUSCULAR | Status: AC
  Administered 2015-11-29 – 2015-11-30 (×4): 7.5 mg via INTRAVENOUS
  Filled 2015-11-29 (×3): qty 1

## 2015-11-29 MED ORDER — ROPIVACAINE HCL 5 MG/ML IJ SOLN
INTRAMUSCULAR | Status: AC
  Filled 2015-11-29: qty 30

## 2015-11-29 MED ORDER — KETOROLAC TROMETHAMINE 15 MG/ML IJ SOLN
INTRAMUSCULAR | Status: AC
  Administered 2015-11-30: 7.5 mg via INTRAVENOUS
  Filled 2015-11-29: qty 1

## 2015-11-29 SURGICAL SUPPLY — 67 items
BAG SPEC THK2 15X12 ZIP CLS (MISCELLANEOUS)
BAG ZIPLOCK 12X15 (MISCELLANEOUS) IMPLANT
BANDAGE ACE 4X5 VEL STRL LF (GAUZE/BANDAGES/DRESSINGS) ×3 IMPLANT
BANDAGE ACE 6X5 VEL STRL LF (GAUZE/BANDAGES/DRESSINGS) ×3 IMPLANT
BANDAGE ESMARK 6X9 LF (GAUZE/BANDAGES/DRESSINGS) ×1 IMPLANT
BLADE SAW RECIPROCATING 77.5 (BLADE) ×3 IMPLANT
BNDG CMPR 9X6 STRL LF SNTH (GAUZE/BANDAGES/DRESSINGS) ×1
BNDG ESMARK 6X9 LF (GAUZE/BANDAGES/DRESSINGS) ×3
CAPT KNEE TRIATH TK-4 ×2 IMPLANT
CHLORAPREP W/TINT 26ML (MISCELLANEOUS) ×4 IMPLANT
CUFF TOURN SGL QUICK 34 (TOURNIQUET CUFF) ×3
CUFF TRNQT CYL 34X4X40X1 (TOURNIQUET CUFF) ×1 IMPLANT
DECANTER SPIKE VIAL GLASS SM (MISCELLANEOUS) ×6 IMPLANT
DRAPE EXTREMITY T 121X128X90 (DRAPE) ×3 IMPLANT
DRAPE POUCH INSTRU U-SHP 10X18 (DRAPES) ×3 IMPLANT
DRAPE SHEET LG 3/4 BI-LAMINATE (DRAPES) ×6 IMPLANT
DRAPE U-SHAPE 47X51 STRL (DRAPES) ×3 IMPLANT
DRSG AQUACEL AG ADV 3.5X10 (GAUZE/BANDAGES/DRESSINGS) ×3 IMPLANT
DRSG TEGADERM 4X4.75 (GAUZE/BANDAGES/DRESSINGS) IMPLANT
ELECT PENCIL ROCKER SW 15FT (MISCELLANEOUS) ×3 IMPLANT
ELECT REM PT RETURN 9FT ADLT (ELECTROSURGICAL) ×3
ELECTRODE REM PT RTRN 9FT ADLT (ELECTROSURGICAL) ×1 IMPLANT
EVACUATOR 1/8 PVC DRAIN (DRAIN) IMPLANT
FACESHIELD WRAPAROUND (MASK) ×6 IMPLANT
FACESHIELD WRAPAROUND OR TEAM (MASK) ×2 IMPLANT
GAUZE SPONGE 4X4 12PLY STRL (GAUZE/BANDAGES/DRESSINGS) ×3 IMPLANT
GLOVE BIO SURGEON STRL SZ8.5 (GLOVE) ×6 IMPLANT
GLOVE BIOGEL M 7.0 STRL (GLOVE) ×4 IMPLANT
GLOVE BIOGEL PI IND STRL 8.5 (GLOVE) ×1 IMPLANT
GLOVE BIOGEL PI INDICATOR 8.5 (GLOVE) ×2
GOWN SPEC L3 XXLG W/TWL (GOWN DISPOSABLE) ×3 IMPLANT
GOWN STRL REUS W/TWL XL LVL4 (GOWN DISPOSABLE) ×2 IMPLANT
HANDPIECE INTERPULSE COAX TIP (DISPOSABLE) ×3
HOOD PEEL AWAY FLYTE STAYCOOL (MISCELLANEOUS) ×6 IMPLANT
LIQUID BAND (GAUZE/BANDAGES/DRESSINGS) ×6 IMPLANT
MARKER SKIN DUAL TIP RULER LAB (MISCELLANEOUS) ×3 IMPLANT
NDL SPNL 18GX3.5 QUINCKE PK (NEEDLE) ×1 IMPLANT
NEEDLE SPNL 18GX3.5 QUINCKE PK (NEEDLE) ×3 IMPLANT
NS IRRIG 1000ML POUR BTL (IV SOLUTION) ×3 IMPLANT
PADDING CAST COTTON 6X4 STRL (CAST SUPPLIES) ×3 IMPLANT
POSITIONER SURGICAL ARM (MISCELLANEOUS) ×3 IMPLANT
SAW OSC TIP CART 19.5X105X1.3 (SAW) ×3 IMPLANT
SEALER BIPOLAR AQUA 6.0 (INSTRUMENTS) ×3 IMPLANT
SET HNDPC FAN SPRY TIP SCT (DISPOSABLE) ×1 IMPLANT
SET PAD KNEE POSITIONER (MISCELLANEOUS) ×3 IMPLANT
SOL PREP POV-IOD 4OZ 10% (MISCELLANEOUS) ×3 IMPLANT
SPONGE DRAIN TRACH 4X4 STRL 2S (GAUZE/BANDAGES/DRESSINGS) IMPLANT
SPONGE LAP 18X18 X RAY DECT (DISPOSABLE) IMPLANT
SUCTION FRAZIER HANDLE 12FR (TUBING) ×2
SUCTION TUBE FRAZIER 12FR DISP (TUBING) ×1 IMPLANT
SUT MNCRL AB 3-0 PS2 18 (SUTURE) ×3 IMPLANT
SUT MON AB 2-0 CT1 36 (SUTURE) ×6 IMPLANT
SUT VIC AB 1 CT1 27 (SUTURE) ×6
SUT VIC AB 1 CT1 27XBRD ANTBC (SUTURE) ×2 IMPLANT
SUT VIC AB 1 CT1 36 (SUTURE) ×3 IMPLANT
SUT VIC AB 2-0 CT1 27 (SUTURE) ×3
SUT VIC AB 2-0 CT1 TAPERPNT 27 (SUTURE) ×1 IMPLANT
SUT VLOC 180 0 24IN GS25 (SUTURE) ×3 IMPLANT
SYR 50ML LL SCALE MARK (SYRINGE) ×3 IMPLANT
TOWEL OR 17X26 10 PK STRL BLUE (TOWEL DISPOSABLE) ×6 IMPLANT
TOWEL OR NON WOVEN STRL DISP B (DISPOSABLE) ×3 IMPLANT
TOWER CARTRIDGE SMART MIX (DISPOSABLE) IMPLANT
TRAY FOLEY W/METER SILVER 14FR (SET/KITS/TRAYS/PACK) IMPLANT
TRAY FOLEY W/METER SILVER 16FR (SET/KITS/TRAYS/PACK) IMPLANT
WATER STERILE IRR 1500ML POUR (IV SOLUTION) ×3 IMPLANT
WRAP KNEE MAXI GEL POST OP (GAUZE/BANDAGES/DRESSINGS) ×3 IMPLANT
YANKAUER SUCT BULB TIP 10FT TU (MISCELLANEOUS) ×3 IMPLANT

## 2015-11-29 NOTE — Anesthesia Postprocedure Evaluation (Signed)
Anesthesia Post Note  Patient: Ebony [name]  Procedure(s) Performed: Procedure(s) (LRB): RIGHT TOTAL KNEE ARTHROPLASTY WITH COMPUTER NAVIGATION (Right)  Patient location during evaluation: PACU Anesthesia Type: General and Regional Level of consciousness: awake and alert Pain management: pain level controlled Vital Signs Assessment: post-procedure vital signs reviewed and stable Respiratory status: spontaneous breathing, nonlabored ventilation, respiratory function stable and patient connected to nasal cannula oxygen Cardiovascular status: blood pressure returned to baseline and stable Postop Assessment: no signs of nausea or vomiting Anesthetic complications: no    Last Vitals:  Filed Vitals:   11/29/15 1945 11/29/15 2000  BP: 140/68 117/50  Pulse: 62 65  Temp:  36.7 C  Resp: 16 13    Last Pain:  Filed Vitals:   11/29/15 2007  PainSc: 5                  [name],W. EDMOND

## 2015-11-29 NOTE — Progress Notes (Signed)
AssistedDr. Edmond Fitzgerald with right, ultrasound guided, adductor canal block. Side rails up, monitors on throughout procedure. See vital signs in flow sheet. Tolerated Procedure well.  

## 2015-11-29 NOTE — Anesthesia Preprocedure Evaluation (Addendum)
Anesthesia Evaluation  Patient identified by MRN, date of birth, ID band Patient awake    Reviewed: Allergy & Precautions, H&P , NPO status , Patient's Chart, lab work & pertinent test results  Airway Mallampati: II  TM Distance: >3 FB Neck ROM: Full    Dental no notable dental hx. (+) Teeth Intact, Dental Advisory Given   Pulmonary neg pulmonary ROS, former smoker,    Pulmonary exam normal breath sounds clear to auscultation       Cardiovascular hypertension, Pt. on medications  Rhythm:Regular Rate:Normal     Neuro/Psych Depression CVA, No Residual Symptoms    GI/Hepatic Neg liver ROS, GERD  Medicated and Controlled,  Endo/Other  negative endocrine ROS  Renal/GU negative Renal ROS  negative genitourinary   Musculoskeletal  (+) Arthritis , Osteoarthritis,    Abdominal   Peds  Hematology negative hematology ROS (+)   Anesthesia Other Findings   Reproductive/Obstetrics negative OB ROS                           Anesthesia Physical Anesthesia Plan  ASA: III  Anesthesia Plan: General and Regional   Post-op Pain Management: GA combined w/ Regional for post-op pain   Induction: Intravenous  Airway Management Planned: LMA  Additional Equipment:   Intra-op Plan:   Post-operative Plan: Extubation in OR  Informed Consent: I have reviewed the patients History and Physical, chart, labs and discussed the procedure including the risks, benefits and alternatives for the proposed anesthesia with the patient or authorized representative who has indicated his/her understanding and acceptance.   Dental advisory given  Plan Discussed with: CRNA  Anesthesia Plan Comments:         Anesthesia Quick Evaluation

## 2015-11-29 NOTE — Transfer of Care (Signed)
Immediate Anesthesia Transfer of Care Note  Patient: Ebony [name]  Procedure(s) Performed: Procedure(s) with comments: RIGHT TOTAL KNEE ARTHROPLASTY WITH COMPUTER NAVIGATION (Right) - with aductor block  Patient Location: PACU  Anesthesia Type:General  Level of Consciousness: awake, alert  and oriented  Airway & Oxygen Therapy: Patient Spontanous Breathing and Patient connected to face mask oxygen  Post-op Assessment: Report given to RN and Post -op Vital signs reviewed and stable  Post vital signs: Reviewed and stable  Last Vitals:  Filed Vitals:   11/29/15 1529 11/29/15 1530  BP:    Pulse: 59 61  Temp:    Resp: 12 17    Last Pain: There were no vitals filed for this visit.    Patients Stated Pain Goal: 4 ([id number])  Complications: No apparent anesthesia complications

## 2015-11-29 NOTE — H&P (View-Only) (Signed)
TOTAL KNEE ADMISSION H&P  Patient is being admitted for right total knee arthroplasty.  Subjective:  Chief Complaint:right knee pain.  HPI: Ebony [name], 75 y.o. female, has a history of pain and functional disability in the right knee due to arthritis and has failed non-surgical conservative treatments for greater than 12 weeks to includeNSAID's and/or analgesics, corticosteriod injections, viscosupplementation injections, flexibility and strengthening excercises, use of assistive devices, weight reduction as appropriate and activity modification.  Onset of symptoms was gradual, starting 5 years ago with gradually worsening course since that time. The patient noted prior procedures on the knee to include  arthroscopy on the right knee(s).  Patient currently rates pain in the right knee(s) at 10 out of 10 with activity. Patient has night pain, worsening of pain with activity and weight bearing, pain that interferes with activities of daily living, pain with passive range of motion, crepitus and joint swelling.  Patient has evidence of subchondral cysts, subchondral sclerosis, periarticular osteophytes and joint space narrowing by imaging studies. There is no active infection.  Patient Active Problem List   Diagnosis Date Noted  . Acute medial meniscal tear 10/04/2013   Past Medical History  Diagnosis Date  . Stroke ([place])     TIA 2004- PT IS ON PLAVIX  . Hypertension   . Depression   . Osteopenia   . Right knee meniscal tear   . Cancer ([place])     BASAL CELL CA  . Dyslipidemia     STABLE - OFF MEDICATION  . Heart murmur   . Arthritis     Past Surgical History  Procedure Laterality Date  . Mohs surgery on nose 01/2007, mohs on face 08/2012    . Cardiac catheterization    . Tonsillectomy    . Knee arthroscopy Right 10/05/2013    Procedure: RIGHT KNEE ARTHROSCOPY WITH medial and lateral DEBRIDEMENT chrondroplasty;  Surgeon: [name], MD;  Location: [facility];  Service: Orthopedics;   Laterality: Right;     (Not in a hospital admission) Allergies  Allergen Reactions  . Other Anaphylaxis    Muscle Relaxer-Unsure which one  . Codeine Nausea And Vomiting  . Sulfa Antibiotics Other (See Comments)    Red streaks on nose- ? cellulitis    Social History  Substance Use Topics  . Smoking status: Former Smoker -- 1.00 packs/day for 10 years    Types: Cigarettes    Quit date: 10/04/1971  . Smokeless tobacco: Never Used  . Alcohol Use: Yes     Comment: QUIT [place] ALCOHOL    No family history on file.   Review of Systems  Constitutional: Negative.   Eyes: Negative.   Respiratory: Positive for shortness of breath.   Cardiovascular: Negative.   Gastrointestinal: Negative.   Genitourinary: Negative.   Musculoskeletal: Positive for back pain and joint pain.  Skin: Negative.   Neurological: Positive for dizziness.  Endo/Heme/Allergies: Negative.   Psychiatric/Behavioral: Negative.     Objective:  Physical Exam  Vitals reviewed. Constitutional: She is oriented to person, place, and time. She appears well-developed and well-nourished.  HENT:  Head: Normocephalic and atraumatic.  Eyes: Conjunctivae and EOM are normal. Pupils are equal, round, and reactive to light.  Neck: Normal range of motion. Neck supple.  Cardiovascular: Normal rate, regular rhythm and intact distal pulses.   Respiratory: Effort normal and breath sounds normal.  GI: Soft. Bowel sounds are normal. She exhibits no distension.  Genitourinary:  deferred  Musculoskeletal:  Right knee: She exhibits decreased range of motion, swelling, effusion and bony tenderness. Tenderness found. Lateral joint line tenderness noted.  Neurological: She is alert and oriented to person, place, and time. She has normal reflexes.  Skin: Skin is warm and dry.  Psychiatric: She has a normal mood and affect. Her behavior is normal. Judgment and thought content normal.    Vital signs in last 24  hours: @VSRANGES @  Labs:   Estimated body mass index is 29.87 kg/(m^2) as calculated from the following:   Height as of 08/01/14: 5\' 6"  (1.676 m).   Weight as of 08/01/14: 83.915 kg (185 lb).   Imaging Review Plain radiographs demonstrate severe degenerative joint disease of the right knee(s). The overall alignment issignificant valgus. The bone quality appears to be good for age and reported activity level.  Assessment/Plan:  End stage arthritis, right knee   The patient history, physical examination, clinical judgment of the provider and imaging studies are consistent with end stage degenerative joint disease of the right knee(s) and total knee arthroplasty is deemed medically necessary. The treatment options including medical management, injection therapy arthroscopy and arthroplasty were discussed at length. The risks and benefits of total knee arthroplasty were presented and reviewed. The risks due to aseptic loosening, infection, stiffness, patella tracking problems, thromboembolic complications and other imponderables were discussed. The patient acknowledged the explanation, agreed to proceed with the plan and consent was signed. Patient is being admitted for inpatient treatment for surgery, pain control, PT, OT, prophylactic antibiotics, VTE prophylaxis, progressive ambulation and ADL's and discharge planning. The patient is planning to be discharged home with home health services

## 2015-11-29 NOTE — Op Note (Signed)
OPERATIVE REPORT  SURGEON: Rod Can, MD   ASSISTANT: Nehemiah Massed, PA-C.  PREOPERATIVE DIAGNOSIS: Right knee arthritis.   POSTOPERATIVE DIAGNOSIS: Right knee arthritis.   PROCEDURE: Right total knee arthroplasty.   IMPLANTS: Stryker Triathlon CR femur, size 4. Stryker Tritanium tibia, size 4. X3 polyethelyene insert, size 9 mm, CR. 3 button asymmetric patella, size 29 mm.  ANESTHESIA:  General  TOURNIQUET TIME: Not utilized.   ESTIMATED BLOOD LOSS: 300 mL.  ANTIBIOTICS: 2 g ancef.  DRAINS: None.  COMPLICATIONS: None   CONDITION: PACU - hemodynamically stable.   BRIEF CLINICAL NOTE: Ebony [name] is a 75 y.o. female with a long-standing history of Right knee arthritis. After failing conservative management, the patient was indicated for total knee arthroplasty. The risks, benefits, and alternatives to the procedure were explained, and the patient elected to proceed.  PROCEDURE IN DETAIL: Spinal anesthesia was obtained in the pre-op holding area. Once inside the operative room, a foley catheter was inserted. The patient was then positioned, a nonsterile tourniquet was placed, and the lower extremity was prepped and draped in the normal sterile surgical fashion. A time-out was called verifying side and site of surgery. The patient received IV antibiotics within 60 minutes of beginning the procedure. The tourniquet was not utilized.  An anterior approach to the knee was performed utilizing a medial parapatellar arthrotomy. A medial release was performed and the patellar fat pad was excised. Brainlab Knee3 navigation was used to cut the distal femur perpendicular to the mechanical axis. A freehand patellar resection was performed, and the patella was sized an prepared with 3 lug holes.  Nagivation was used to make a neutral proximal tibia resection, taking 8 mm of bone from  the less affected lateral side with 6 degrees of slope. The menisci were excised. A spacer block was placed, and the alignment and balance in extension were confirmed.   The distal femur was sized using the 3-degree external rotation guide referencing the posterior femoral cortex. The appropriate 4-in-1 cutting block was pinned into place. Rotation was checked using Whiteside's line, the epicondylar axis, and then confirmed with a spacer block in flexion. The remaining femoral cuts were performed, taking care to protect the MCL.  The tibia was sized and the trial tray was pinned into place. The remaining trail components were inserted. The knee was stable to varus and valgus stress through a full range of motion. The patella tracked centrally, and the PCL was well balanced. The trial components were removed, and the proximal tibial surface was prepared. Final components were impacted into place. The knee was tested for a final time and found to be well balanced.  The wound was copiously irrigated with a dilute betadine solution followed by normal saline with pulse lavage. Marcaine solution was injected into the periarticular soft tissue. The wound was closed in layers using #1 Vicryl and V-Loc for the fascia, 2-0 Vicryl for the subcutaneous fat, 2-0 Monocryl for the deep dermal layer, 3-0 running Monocryl subcuticular Stitch, and Dermabond for the skin. Once the glue was fully dried, an Aquacell Ag and compressive dressing were applied. The tourniquet was let down, and the patient was transported to the recovery room in stable ondition. Sponge, needle, and instrument counts were correct at the end of the case x2. The patient tolerated the procedure well and there were no known complications.  Please note that a surgical assistant was a medical necessity for this procedure in order to perform it in a safe and expeditious  manner. Surgical assistant was necessary to retract the ligaments and vital  neurovascular structures to prevent injury to them and also necessary for proper positioning of the limb to allow for anatomic placement of the prosthesis.

## 2015-11-29 NOTE — Anesthesia Procedure Notes (Addendum)
Anesthesia Regional Block:  Adductor canal block  Pre-Anesthetic Checklist: ,, timeout performed, Correct Patient, Correct Site, Correct Laterality, Correct Procedure, Correct Position, site marked, Risks and benefits discussed, pre-op evaluation,  At surgeon's request and post-op pain management  Laterality: Right  Prep: Maximum Sterile Barrier Precautions used and chloraprep       Needles:  Injection technique: Single-shot  Needle Type: Echogenic Stimulator Needle     Needle Length: 10cm 10 cm Needle Gauge: 21 and 21 G    Additional Needles:  Procedures: ultrasound guided (picture in chart) Adductor canal block Narrative:  Start time: 11/29/2015 3:13 PM End time: 11/29/2015 3:23 PM Injection made incrementally with aspirations every 5 mL. Anesthesiologist: Roderic Palau  Additional Notes: 2% Lidocaine skin wheel.    Procedure Name: Intubation Date/Time: 11/29/2015 4:11 PM Performed by: Lind Covert Pre-anesthesia Checklist: Patient identified, Timeout performed, Emergency Drugs available, Suction available and Patient being monitored Patient Re-evaluated:Patient Re-evaluated prior to inductionOxygen Delivery Method: Circle system utilized Preoxygenation: Pre-oxygenation with 100% oxygen Intubation Type: IV induction Laryngoscope Size: Mac and 4 Grade View: Grade I Tube type: Oral Tube size: 7.5 mm Number of attempts: 1 Airway Equipment and Method: Stylet Placement Confirmation: ETT inserted through vocal cords under direct vision,  breath sounds checked- equal and bilateral and positive ETCO2 Secured at: 22 cm Tube secured with: Tape Dental Injury: Teeth and Oropharynx as per pre-operative assessment

## 2015-11-29 NOTE — Discharge Instructions (Signed)
° °Dr. Eri Mcevers °Total Joint Specialist °[facility] °[address] °[place], [place] [address] °[phone] ° °TOTAL KNEE REPLACEMENT POSTOPERATIVE DIRECTIONS ° ° ° °Knee Rehabilitation, Guidelines Following Surgery  °Results after knee surgery are often greatly improved when you follow the exercise, range of motion and muscle strengthening exercises prescribed by your doctor. Safety measures are also important to protect the knee from further injury. Any time any of these exercises cause you to have increased pain or swelling in your knee joint, decrease the amount until you are comfortable again and slowly increase them. If you have problems or questions, call your caregiver or physical therapist for advice.  ° °WEIGHT BEARING °Weight bearing as tolerated with assist device (walker, cane, etc) as directed, use it as long as suggested by your surgeon or therapist, typically at least 4-6 weeks. ° °HOME CARE INSTRUCTIONS  °Remove items at home which could result in a fall. This includes throw rugs or furniture in walking pathways.  °Continue medications as instructed at time of discharge. °You may have some home medications which will be placed on hold until you complete the course of blood thinner medication.  °You may start showering once you are discharged home but do not submerge the incision under water. Just pat the incision dry and apply a dry gauze dressing on daily. °Walk with walker as instructed.  °You may resume a sexual relationship in one month or when given the OK by your doctor.  °· Use walker as long as suggested by your caregivers. °· Avoid periods of inactivity such as sitting longer than an hour when not asleep. This helps prevent blood clots.  °You may put full weight on your legs and walk as much as is comfortable.  °You may return to work once you are cleared by your doctor.  °Do not drive a car for 6 weeks or until released by you surgeon.  °· Do not drive  while taking narcotics.  °Wear the elastic stockings for three weeks following surgery during the day but you may remove then at night. °Make sure you keep all of your appointments after your operation with all of your doctors and caregivers. You should call the office at the above phone number and make an appointment for approximately two weeks after the date of your surgery. °Do not remove your surgical dressing. The dressing is waterproof; you may take showers in 3 days, but do not take tub baths or submerge the dressing. °Please pick up a stool softener and laxative for home use as long as you are requiring pain medications. °· ICE to the affected knee every three hours for 30 minutes at a time and then as needed for pain and swelling.  Continue to use ice on the knee for pain and swelling from surgery. You may notice swelling that will progress down to the foot and ankle.  This is normal after surgery.  Elevate the leg when you are not up walking on it.   °It is important for you to complete the blood thinner medication as prescribed by your doctor. °· Continue to use the breathing machine which will help keep your temperature down.  It is common for your temperature to cycle up and down following surgery, especially at night when you are not up moving around and exerting yourself.  The breathing machine keeps your lungs expanded and your temperature down. ° °RANGE OF MOTION AND STRENGTHENING EXERCISES  °Rehabilitation of the knee is important following   a knee injury or an operation. After just a few days of immobilization, the muscles of the thigh which control the knee become weakened and shrink (atrophy). Knee exercises are designed to build up the tone and strength of the thigh muscles and to improve knee motion. Often times heat used for twenty to thirty minutes before working out will loosen up your tissues and help with improving the range of motion but do not use heat for the first two weeks following  surgery. These exercises can be done on a training (exercise) mat, on the floor, on a table or on a bed. Use what ever works the best and is most comfortable for you Knee exercises include:  °Leg Lifts - While your knee is still immobilized in a splint or cast, you can do straight leg raises. Lift the leg to 60 degrees, hold for 3 sec, and slowly lower the leg. Repeat 10-20 times 2-3 times daily. Perform this exercise against resistance later as your knee gets better.  °Quad and Hamstring Sets - Tighten up the muscle on the front of the thigh (Quad) and hold for 5-10 sec. Repeat this 10-20 times hourly. Hamstring sets are done by pushing the foot backward against an object and holding for 5-10 sec. Repeat as with quad sets.  °A rehabilitation program following serious knee injuries can speed recovery and prevent re-injury in the future due to weakened muscles. Contact your doctor or a physical therapist for more information on knee rehabilitation.  ° °SKILLED REHAB INSTRUCTIONS: °If the patient is transferred to a skilled rehab facility following release from the hospital, a list of the current medications will be sent to the facility for the patient to continue.  When discharged from the skilled rehab facility, please have the facility set up the patient's Home Health Physical Therapy prior to being released. Also, the skilled facility will be responsible for providing the patient with their medications at time of release from the facility to include their pain medication, the muscle relaxants, and their blood thinner medication. If the patient is still at the rehab facility at time of the two week follow up appointment, the skilled rehab facility will also need to assist the patient in arranging follow up appointment in our office and any transportation needs. ° °MAKE SURE YOU:  °Understand these instructions.  °Will watch your condition.  °Will get help right away if you are not doing well or get worse.   ° ° °Pick up stool softner and laxative for home use following surgery while on pain medications. °Do NOT remove your dressing. You may shower.  °Do not take tub baths or submerge incision under water. °May shower starting three days after surgery. °Please use a clean towel to pat the incision dry following showers. °Continue to use ice for pain and swelling after surgery. °Do not use any lotions or creams on the incision until instructed by your surgeon. ° °

## 2015-11-29 NOTE — Interval H&P Note (Signed)
History and Physical Interval Note:  11/29/2015 2:55 PM  Ebony [name]  has presented today for surgery, with the diagnosis of Ebony [name]  The various methods of treatment have been discussed with the patient and family. After consideration of risks, benefits and other options for treatment, the patient has consented to  Procedure(s): RIGHT TOTAL KNEE ARTHROPLASTY WITH COMPUTER NAVIGATION (Right) as a surgical intervention .  The patient's history has been reviewed, patient examined, no change in status, stable for surgery.  I have reviewed the patient's chart and labs.  Questions were answered to the patient's satisfaction.     Stephaniemarie Stoffel, Horald Pollen

## 2015-11-30 ENCOUNTER — Encounter (HOSPITAL_COMMUNITY): Payer: Self-pay | Admitting: Orthopedic Surgery

## 2015-11-30 LAB — BASIC METABOLIC PANEL
Anion gap: 11 (ref 5–15)
BUN: 17 mg/dL (ref 6–20)
CHLORIDE: 104 mmol/L (ref 101–111)
CO2: 27 mmol/L (ref 22–32)
CREATININE: 1.03 mg/dL — AB (ref 0.44–1.00)
Calcium: 8.4 mg/dL — ABNORMAL LOW (ref 8.9–10.3)
GFR calc Af Amer: >60 mL/min (ref >60)
GFR, EST NON AFRICAN AMERICAN: 52 mL/min — AB (ref >60)
GLUCOSE: 173 mg/dL — AB (ref 65–99)
POTASSIUM: 4.2 mmol/L (ref 3.5–5.1)
SODIUM: 142 mmol/L (ref 135–145)

## 2015-11-30 LAB — CBC
HCT: 30.4 % — ABNORMAL LOW (ref 36.0–46.0)
Hemoglobin: 10.4 g/dL — ABNORMAL LOW (ref 12.0–15.0)
MCH: 33.4 pg (ref 26.0–34.0)
MCHC: 34.2 g/dL (ref 30.0–36.0)
MCV: 97.7 fL (ref 78.0–100.0)
PLATELETS: 197 10*3/uL (ref 150–400)
RBC: 3.11 MIL/uL — ABNORMAL LOW (ref 3.87–5.11)
RDW: 12.6 % (ref 11.5–15.5)
WBC: 8.5 10*3/uL (ref 4.0–10.5)

## 2015-11-30 MED ORDER — METHOCARBAMOL 500 MG PO TABS: 500.0000 mg | ORAL_TABLET | Freq: Four times a day (QID) | ORAL | Status: DC | PRN

## 2015-11-30 MED ORDER — DOCUSATE SODIUM 100 MG PO CAPS: 100.0000 mg | ORAL_CAPSULE | Freq: Two times a day (BID) | ORAL | Status: DC

## 2015-11-30 MED ORDER — ASPIRIN 81 MG PO TBEC: 81.0000 mg | DELAYED_RELEASE_TABLET | Freq: Two times a day (BID) | ORAL | Status: DC

## 2015-11-30 MED ORDER — HYDROCODONE-ACETAMINOPHEN 5-325 MG PO TABS: 1.0000 | ORAL_TABLET | ORAL | Status: DC | PRN

## 2015-11-30 MED ORDER — SENNA 8.6 MG PO TABS: 2.0000 | ORAL_TABLET | Freq: Every day | ORAL | Status: DC

## 2015-11-30 MED ORDER — ONDANSETRON HCL 4 MG PO TABS: 4.0000 mg | ORAL_TABLET | Freq: Four times a day (QID) | ORAL | Status: DC | PRN

## 2015-11-30 NOTE — Progress Notes (Signed)
Physical Therapy Treatment Patient Details Name: Ebony [name] MRN: [id number] DOB: 05-27-1941 Today's Date: 2015/12/01    History of Present Illness Pt s/p R TKR with hs of TIA    PT Comments    Pt very motivated and eager for return home.  Reviewed therex, stairs and don/doff KI to use to maintain knee ext and allow pt to sleep on side.  Follow Up Recommendations  Home health PT     Equipment Recommendations  Rolling walker with 5" wheels    Recommendations for Other Services OT consult     Precautions / Restrictions Precautions Precautions: Fall;Knee Restrictions Weight Bearing Restrictions: No Other Position/Activity Restrictions: WBAT    Mobility  Bed Mobility                  Transfers Overall transfer level: Needs assistance Equipment used: Rolling walker (2 wheeled) Transfers: Sit to/from Stand Sit to Stand: Min guard;Supervision         General transfer comment: cues for LE management and use of UEs to self assist  Ambulation/Gait Ambulation/Gait assistance: Min guard;Supervision Ambulation Distance (Feet): 100 Feet Assistive device: Rolling walker (2 wheeled) Gait Pattern/deviations: Step-to pattern;Step-through pattern;Decreased step length - right;Decreased step length - left;Shuffle;Trunk flexed Gait velocity: decr   General Gait Details: cues for posture, position from RW and sequence   Stairs Stairs: Yes Stairs assistance: Min assist Stair Management: Two rails;Step to pattern;Forwards Number of Stairs: 5 General stair comments: min cues for sequence and foot placement  Wheelchair Mobility    Modified Rankin (Stroke Patients Only)       Balance                                    Cognition Arousal/Alertness: Awake/alert Behavior During Therapy: WFL for tasks assessed/performed Overall Cognitive Status: Within Functional Limits for tasks assessed                      Exercises Total Joint  Exercises Ankle Circles/Pumps: AROM;Both;15 reps;Supine Quad Sets: AROM;Both;10 reps;Supine    General Comments        Pertinent Vitals/Pain Pain Assessment: 0-10 Pain Score: 2  Pain Location: R knee Pain Descriptors / Indicators: Aching;Sore Pain Intervention(s): Limited activity within patient's tolerance;Monitored during session;Premedicated before session;Ice applied    Home Living                      Prior Function            PT Goals (current goals can now be found in the care plan section) Acute Rehab PT Goals Patient Stated Goal: Regain IND PT Goal Formulation: With patient Time For Goal Achievement: 12/04/15 Potential to Achieve Goals: Good Progress towards PT goals: Progressing toward goals    Frequency  7X/week    PT Plan Current plan remains appropriate    Co-evaluation             End of Session Equipment Utilized During Treatment: Gait belt Activity Tolerance: Patient tolerated treatment well Patient left: in chair;with call bell/phone within reach     Time: 1320-1346 PT Time Calculation (min) (ACUTE ONLY): 26 min  Charges:  $Gait Training: 8-22 mins $Therapeutic Activity: 8-22 mins                    G Codes:      Ebony [name] 2015/12/01, 3:47 PM

## 2015-11-30 NOTE — Evaluation (Signed)
Physical Therapy Evaluation Patient Details Name: Ebony [name] MRN: [id number] DOB: Aug 20, 1940 Today's Date: 11/30/2015   History of Present Illness  Pt s/p R TKR with hs of TIA  Clinical Impression  Pt s/p R TKR presents with decreased R LE strength/ROM and post op pain limiting functional mobility.  Pt should progress to dc home with family assist and HHPT follow up.    Follow Up Recommendations Home health PT    Equipment Recommendations  Rolling walker with 5" wheels    Recommendations for Other Services OT consult     Precautions / Restrictions Precautions Precautions: Fall;Knee Restrictions Weight Bearing Restrictions: No Other Position/Activity Restrictions: WBAT      Mobility  Bed Mobility Overal bed mobility: Needs Assistance Bed Mobility: Supine to Sit     Supine to sit: Min assist     General bed mobility comments: cues for sequence and use of LE LE to self assist  Transfers Overall transfer level: Needs assistance Equipment used: Rolling walker (2 wheeled) Transfers: Sit to/from Stand Sit to Stand: Min guard         General transfer comment: cues for LE management and use of UEs to self assist  Ambulation/Gait Ambulation/Gait assistance: Min assist;Min guard Ambulation Distance (Feet): 85 Feet Assistive device: Rolling walker (2 wheeled) Gait Pattern/deviations: Decreased step length - right;Decreased step length - left;Step-to pattern;Step-through pattern;Shuffle;Trunk flexed Gait velocity: decr Gait velocity interpretation: Below normal speed for age/gender General Gait Details: cues for posture, position from RW and sequence  Stairs            Wheelchair Mobility    Modified Rankin (Stroke Patients Only)       Balance                                             Pertinent Vitals/Pain Pain Assessment: 0-10 Pain Score: 2  Pain Location: R knee Pain Descriptors / Indicators: Aching;Sore Pain  Intervention(s): Limited activity within patient's tolerance;Monitored during session;Premedicated before session;Ice applied    Home Living Family/patient expects to be discharged to:: Private residence Living Arrangements: Alone Available Help at Discharge: Family;Available PRN/intermittently (Pt states dtrs can assist through weekend) Type of Home: House Home Access: Stairs to enter Entrance Stairs-Rails: Right;Left;Can reach both Entrance Stairs-Number of Steps: 2 Home Layout: One level Home Equipment: Cane - single point      Prior Function Level of Independence: Independent;Independent with assistive device(s)               Hand Dominance        Extremity/Trunk Assessment   Upper Extremity Assessment: Overall WFL for tasks assessed           Lower Extremity Assessment: RLE deficits/detail RLE Deficits / Details: 3/5 quads with IND SLR and AAROM at knee -8 - 100    Cervical / Trunk Assessment: Normal  Communication   Communication: No difficulties  Cognition Arousal/Alertness: Awake/alert Behavior During Therapy: WFL for tasks assessed/performed Overall Cognitive Status: Within Functional Limits for tasks assessed                      General Comments      Exercises Total Joint Exercises Ankle Circles/Pumps: AROM;Both;15 reps;Supine Quad Sets: AROM;Both;10 reps;Supine Heel Slides: AAROM;Right;15 reps;Supine Straight Leg Raises: AROM;Right;10 reps;Supine      Assessment/Plan    PT Assessment Patient needs continued  PT services  PT Diagnosis Difficulty walking   PT Problem List Decreased strength;Decreased range of motion;Decreased activity tolerance;Decreased mobility;Decreased knowledge of use of DME;Pain  PT Treatment Interventions DME instruction;Gait training;Stair training;Functional mobility training;Therapeutic activities;Therapeutic exercise;Patient/family education   PT Goals (Current goals can be found in the Care Plan section)  Acute Rehab PT Goals Patient Stated Goal: Regain IND PT Goal Formulation: With patient Time For Goal Achievement: 12/04/15 Potential to Achieve Goals: Good    Frequency 7X/week   Barriers to discharge        Co-evaluation               End of Session Equipment Utilized During Treatment: Gait belt Activity Tolerance: Patient tolerated treatment well Patient left: in chair;with call bell/phone within reach Nurse Communication: Mobility status         Time: 0825-0855 PT Time Calculation (min) (ACUTE ONLY): 30 min   Charges:   PT Evaluation $PT Eval Low Complexity: 1 Procedure PT Treatments $Therapeutic Exercise: 8-22 mins   PT G Codes:        Eliya Geiman 2015/12/25, 10:29 AM

## 2015-11-30 NOTE — Progress Notes (Signed)
Occupational Therapy Evaluation Patient Details Name: Ebony [name] MRN: [id number] DOB: [birth year] Today's Date: 11/30/2015    History of Present Illness Pt s/p R TKR with hs of TIA   Clinical Impression   All education completed and pt questions answered. No further OT needs. Will sign off.    Follow Up Recommendations  No OT follow up;Supervision/Assistance - 24 hour    Equipment Recommendations  3 in 1 bedside comode    Recommendations for Other Services       Precautions / Restrictions Precautions Precautions: Fall;Knee Restrictions Other Position/Activity Restrictions: WBAT      Mobility Bed Mobility                  Transfers                      Balance                                            ADL Overall ADL's : Needs assistance/impaired Eating/Feeding: Independent;Sitting   Grooming: Supervision/safety;Standing   Upper Body Bathing: Set up;Sitting   Lower Body Bathing: Minimal assistance;Sit to/from stand   Upper Body Dressing : Set up;Sitting   Lower Body Dressing: Minimal assistance;Sit to/from stand   Toilet Transfer: Supervision/safety;Ambulation;Regular Toilet;BSC;RW   Toileting- Clothing Manipulation and Hygiene: Supervision/safety;Sit to/from stand   Tub/ Shower Transfer: Walk-in shower;Supervision/safety;Grab bars;Shower seat;Rolling walker   Functional mobility during ADLs: Supervision/safety;Rolling walker       Vision     Perception     Praxis      Pertinent Vitals/Pain Pain Assessment: 0-10 Pain Score: 5  Pain Location: R knee and R hip Pain Descriptors / Indicators: Constant Pain Intervention(s): Limited activity within patient's tolerance;Monitored during session     Hand Dominance     Extremity/Trunk Assessment Upper Extremity Assessment Upper Extremity Assessment: Overall WFL for tasks assessed   Lower Extremity Assessment Lower Extremity Assessment: Defer to PT  evaluation   Cervical / Trunk Assessment Cervical / Trunk Assessment: Normal   Communication Communication Communication: No difficulties   Cognition Arousal/Alertness: Awake/alert Behavior During Therapy: WFL for tasks assessed/performed Overall Cognitive Status: Within Functional Limits for tasks assessed                     General Comments       Exercises       Shoulder Instructions      Home Living Family/patient expects to be discharged to:: Private residence Living Arrangements: Alone Available Help at Discharge: Family;Available PRN/intermittently (daughter through the weekend 24/7; friend x 1 week intermitt) Type of Home: House Home Access: Stairs to enter [employer] of Steps: 2 Entrance Stairs-Rails: Right;Left;Can reach both Home Layout: One level     Bathroom Shower/Tub: Occupational psychologist: Standard Bathroom Accessibility: Yes How Accessible: Accessible via walker Home Equipment: Cane - single point          Prior Functioning/Environment Level of Independence: Independent;Independent with assistive device(s)             OT Diagnosis: Acute pain   OT Problem List: Decreased strength;Decreased range of motion;Decreased knowledge of use of DME or AE;Pain   OT Treatment/Interventions:      OT Goals(Current goals can be found in the care plan section) Acute Rehab OT Goals Patient Stated Goal: Regain IND OT Goal  Formulation: All assessment and education complete, DC therapy  OT Frequency:     Barriers to D/C:            Co-evaluation              End of Session Equipment Utilized During Treatment: Rolling walker Nurse Communication: Mobility status  Activity Tolerance: Patient tolerated treatment well Patient left: in chair;with call bell/phone within reach   Time: 1037-1053 OT Time Calculation (min): 16 min Charges:  OT General Charges $OT Visit: 1 Procedure OT Evaluation $OT Eval Low  Complexity: 1 Procedure G-Codes:    Ebony [name] A Dec 19, 2015, 12:47 PM

## 2015-11-30 NOTE — Care Management Note (Signed)
Case Management Note  Patient Details  Name: ENRIKA KATTNER MRN: [id number] Date of Birth: 1940-10-16  Subjective/Objective:  75 y/o f admitted w/OA r knee. From home. [facility] arranged PTA-rep Dona aware of d/c, & HHC orders today. [facility] dme rep jeff-aware of 3n1,rw order, & d/c today to deliver to rm.                  Action/Plan:d/c home w/HHC/DME   Expected Discharge Date:                  Expected Discharge Plan:  [place]  In-House Referral:     Discharge planning Services  CM Consult  Post Acute Care Choice:    Choice offered to:     DME Arranged:  3-N-1, Walker rolling DME Agency:  [place]:    [place]:  [place]  Status of Service:  Completed, signed off  Medicare Important Message Given:    Date Medicare IM Given:    Medicare IM give by:    Date Additional Medicare IM Given:    Additional Medicare Important Message give by:     If discussed at [place] of Stay Meetings, dates discussed:    Additional Comments:  Dessa Phi, RN 11/30/2015, 11:30 AM

## 2015-11-30 NOTE — Progress Notes (Signed)
Utilization review completed.  

## 2015-11-30 NOTE — Discharge Summary (Signed)
Physician Discharge Summary  Patient ID: JANUS RAAD MRN: [id number] DOB/AGE: Nov 19, 1940 75 y.o.  Admit date: 11/29/2015 Discharge date: 11/30/2015  Admission Diagnoses:  Primary osteoarthritis of right knee  Discharge Diagnoses:  Principal Problem:   Primary osteoarthritis of right knee   Past Medical History  Diagnosis Date  . Stroke ([place])     TIA 2004- PT IS ON PLAVIX  . Hypertension   . Depression   . Osteopenia   . Cancer ([place])     BASAL CELL CA  . Heart murmur   . Arthritis   . Shortness of breath dyspnea     WITH EXERTION  . GERD (gastroesophageal reflux disease)     TAKES TUMS PRN  . Tendency toward bleeding easily ([place])     Surgeries: Procedure(s): RIGHT TOTAL KNEE ARTHROPLASTY WITH COMPUTER NAVIGATION on 11/29/2015   Consultants (if any):    Discharged Condition: Improved  Hospital Course: ARTRICE STMARY is an 75 y.o. female who was admitted 11/29/2015 with a diagnosis of Primary osteoarthritis of right knee and went to the operating room on 11/29/2015 and underwent the above named procedures.    She was given perioperative antibiotics:  Anti-infectives    Start     Dose/Rate Route Frequency Ordered Stop   11/30/15 0600  ceFAZolin (ANCEF) IVPB 2g/100 mL premix     2 g 200 mL/hr over 30 Minutes Intravenous On call to O.R. 11/29/15 1348 11/29/15 1613   11/29/15 2200  ceFAZolin (ANCEF) IVPB 2g/100 mL premix     2 g 200 mL/hr over 30 Minutes Intravenous Every 6 hours 11/29/15 2051 11/30/15 0438    .  She was given sequential compression devices, early ambulation, and plavix, ASA  for DVT prophylaxis.  She benefited maximally from the hospital stay and there were no complications.    Recent vital signs:  Filed Vitals:   11/30/15 0300 11/30/15 0558  BP: 102/69 130/55  Pulse: 61 50  Temp: 97.8 F (36.6 C) 97.5 F (36.4 C)  Resp: 14 14    Recent laboratory studies:  Lab Results  Component Value Date   HGB 10.4* 11/30/2015   HGB 14.1 11/19/2015    HGB 13.5 10/03/2013   Lab Results  Component Value Date   WBC 8.5 11/30/2015   PLT 197 11/30/2015   Lab Results  Component Value Date   INR 1.00 11/19/2015   Lab Results  Component Value Date   NA 142 11/30/2015   K 4.2 11/30/2015   CL 104 11/30/2015   CO2 27 11/30/2015   BUN 17 11/30/2015   CREATININE 1.03* 11/30/2015   GLUCOSE 173* 11/30/2015    Discharge Medications:     Medication List    STOP taking these medications        traMADol 50 MG tablet  Commonly known as:  ULTRAM      TAKE these medications        ALPRAZolam 0.5 MG tablet  Commonly known as:  XANAX  Take 0.5 mg by mouth at bedtime as needed for anxiety or sleep.     aspirin 81 MG EC tablet  Take 1 tablet (81 mg total) by mouth 2 (two) times daily after a meal.     cetirizine 10 MG tablet  Commonly known as:  ZYRTEC  Take 10 mg by mouth daily as needed for allergies.     citalopram 20 MG tablet  Commonly known as:  CELEXA  Take 20 mg by mouth daily.  clopidogrel 75 MG tablet  Commonly known as:  PLAVIX  Take 75 mg by mouth daily with breakfast.     docusate sodium 100 MG capsule  Commonly known as:  COLACE  Take 1 capsule (100 mg total) by mouth 2 (two) times daily.     fluticasone 50 MCG/ACT nasal spray  Commonly known as:  FLONASE  Place 2 sprays into the nose daily as needed for allergies.     HYDROcodone-acetaminophen 5-325 MG tablet  Commonly known as:  NORCO  Take 1-2 tablets by mouth every 4 (four) hours as needed for moderate pain.     Magnesium 500 MG Tabs  Take 500 mg by mouth daily.     meclizine 12.5 MG tablet  Commonly known as:  ANTIVERT  Take 12.5 mg by mouth every 6 (six) hours as needed for dizziness.     methocarbamol 500 MG tablet  Commonly known as:  ROBAXIN  Take 1 tablet (500 mg total) by mouth every 6 (six) hours as needed for muscle spasms.     ondansetron 4 MG tablet  Commonly known as:  ZOFRAN  Take 1 tablet (4 mg total) by mouth every 6 (six)  hours as needed for nausea.     senna 8.6 MG Tabs tablet  Commonly known as:  SENOKOT  Take 2 tablets (17.2 mg total) by mouth at bedtime.     triamterene-hydrochlorothiazide 37.5-25 MG capsule  Commonly known as:  DYAZIDE  Take 1 capsule by mouth daily.        Diagnostic Studies: Dg Knee Right Port  11/29/2015  CLINICAL DATA:  75 year old female status post right knee arthroplasty. EXAM: PORTABLE RIGHT KNEE - 1-2 VIEW COMPARISON:  None. FINDINGS: There is a total right knee arthroplasty with femoral and tibial components in anatomic alignment. Orthopedic hardware plate noted along the posterior surface of the patella. There is no acute fracture or dislocation. Multiple pockets of gas noted in the superficial soft tissues of the anterior aspect of the knee as well as in the suprapatellar and Hoffa's fat, postsurgical. IMPRESSION: Total right knee replacement in anatomic alignment. Electronically Signed   By: Anner Crete M.D.   On: 11/29/2015 19:59    Disposition: 01-Home or Self Care      Discharge Instructions    Call MD / Call 911    Complete by:  As directed   If you experience chest pain or shortness of breath, CALL 911 and be transported to the hospital emergency room.  If you develope a fever above 101 F, pus (white drainage) or increased drainage or redness at the wound, or calf pain, call your surgeon's office.     Constipation Prevention    Complete by:  As directed   Drink plenty of fluids.  Prune juice may be helpful.  You may use a stool softener, such as Colace (over the counter) 100 mg twice a day.  Use MiraLax (over the counter) for constipation as needed.     Diet - low sodium heart healthy    Complete by:  As directed      Do not put a pillow under the knee. Place it under the heel.    Complete by:  As directed      Driving restrictions    Complete by:  As directed   No driving for 6 weeks     Increase activity slowly as tolerated    Complete by:  As directed       Lifting restrictions  Complete by:  As directed   No lifting for 6 weeks     TED hose    Complete by:  As directed   Use stockings (TED hose) for 2 weeks on both leg(s).  You may remove them at night for sleeping.           Follow-up Information    Follow up with Masyn Fullam, Horald Pollen, MD. Schedule an appointment as soon as possible for a visit in 2 weeks.   Specialty:  Orthopedic Surgery   Why:  For wound re-check   Contact information:   [address] [address] [phone]        Signed: Elie Goody 11/30/2015, 7:40 AM

## 2015-11-30 NOTE — Progress Notes (Signed)
   Subjective:  Patient reports pain as mild to moderate.  No c/o. Denies N/V/CP/SOB.  Objective:   VITALS:   Filed Vitals:   11/29/15 2228 11/29/15 2330 11/30/15 0300 11/30/15 0558  BP: 119/65 114/53 102/69 130/55  Pulse: 52 66 61 50  Temp: 97.5 F (36.4 C) 97.7 F (36.5 C) 97.8 F (36.6 C) 97.5 F (36.4 C)  TempSrc: Oral Oral Oral Oral  Resp: 14 14 14 14   Height:      Weight:      SpO2: 99% 95% 98% 94%    ABD soft Sensation intact distally Intact pulses distally Dorsiflexion/Plantar flexion intact Incision: dressing C/D/I Compartment soft   Lab Results  Component Value Date   WBC 8.5 11/30/2015   HGB 10.4* 11/30/2015   HCT 30.4* 11/30/2015   MCV 97.7 11/30/2015   PLT 197 11/30/2015   BMET    Component Value Date/Time   NA 142 11/30/2015 0402   K 4.2 11/30/2015 0402   CL 104 11/30/2015 0402   CO2 27 11/30/2015 0402   GLUCOSE 173* 11/30/2015 0402   BUN 17 11/30/2015 0402   CREATININE 1.03* 11/30/2015 0402   CALCIUM 8.4* 11/30/2015 0402   GFRNONAA 52* 11/30/2015 0402   GFRAA >60 11/30/2015 0402     Assessment/Plan: 1 Day Post-Op   Principal Problem:   Primary osteoarthritis of right knee  WBAT with walker DVT ppx: start ASA, resume plavix PO pain control PT/OT D/C home with HHPT   Arliss Hepburn, Horald Pollen 11/30/2015, 7:37 AM   Rod Can, MD Cell [phone]

## 2015-12-03 ENCOUNTER — Encounter (HOSPITAL_COMMUNITY): Payer: Self-pay | Admitting: Orthopedic Surgery

## 2016-02-04 DIAGNOSIS — R42 Dizziness and giddiness: Secondary | ICD-10-CM | POA: Insufficient documentation

## 2016-02-04 DIAGNOSIS — R000 Tachycardia, unspecified: Secondary | ICD-10-CM | POA: Insufficient documentation

## 2016-02-05 ENCOUNTER — Encounter: Payer: Self-pay | Admitting: *Deleted

## 2016-02-05 ENCOUNTER — Ambulatory Visit (INDEPENDENT_AMBULATORY_CARE_PROVIDER_SITE_OTHER): Payer: Medicare Other

## 2016-02-05 DIAGNOSIS — R000 Tachycardia, unspecified: Secondary | ICD-10-CM

## 2016-02-05 DIAGNOSIS — R197 Diarrhea, unspecified: Secondary | ICD-10-CM | POA: Diagnosis not present

## 2016-02-05 DIAGNOSIS — R42 Dizziness and giddiness: Secondary | ICD-10-CM

## 2016-02-05 NOTE — Progress Notes (Signed)
Patient ID: Ebony [name], female   DOB: 1941/03/20, 75 y.o.   MRN: [id number] Patient rescheduled to 12:30 PM.  24 hour holter monitor applied.

## 2016-02-05 NOTE — Progress Notes (Signed)
Patient ID: Ebony [name], female   DOB: Nov 16, 1940, 75 y.o.   MRN: [id number] Patient did not show up for 02/05/16, 10:30 AM, appointment to have a 24 hour holter monitor applied.

## 2016-02-18 ENCOUNTER — Other Ambulatory Visit: Payer: Self-pay | Admitting: Internal Medicine

## 2016-02-18 DIAGNOSIS — Z1231 Encounter for screening mammogram for malignant neoplasm of breast: Secondary | ICD-10-CM

## 2016-03-28 ENCOUNTER — Ambulatory Visit
Admission: RE | Admit: 2016-03-28 | Discharge: 2016-03-28 | Disposition: A | Discharge status: Home / Self Care | Payer: Medicare Other | Source: Ambulatory Visit | Attending: Internal Medicine | Admitting: Internal Medicine

## 2016-03-28 DIAGNOSIS — Z1231 Encounter for screening mammogram for malignant neoplasm of breast: Secondary | ICD-10-CM

## 2016-04-01 ENCOUNTER — Encounter: Payer: Self-pay | Admitting: Podiatry

## 2016-04-01 ENCOUNTER — Ambulatory Visit (INDEPENDENT_AMBULATORY_CARE_PROVIDER_SITE_OTHER): Payer: Medicare Other | Admitting: Podiatry

## 2016-04-01 DIAGNOSIS — M779 Enthesopathy, unspecified: Secondary | ICD-10-CM

## 2016-04-01 DIAGNOSIS — G5761 Lesion of plantar nerve, right lower limb: Secondary | ICD-10-CM | POA: Diagnosis not present

## 2016-04-01 DIAGNOSIS — M7751 Other enthesopathy of right foot: Secondary | ICD-10-CM

## 2016-04-01 DIAGNOSIS — M778 Other enthesopathies, not elsewhere classified: Secondary | ICD-10-CM

## 2016-04-01 NOTE — Progress Notes (Signed)
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 She states that she cannot afford to pay $40 co-pay every 3 weeks.  Objective: Vital signs are stable she's alert and oriented 3. She has strong palpable pulses right foot. She has pain on end range of motion of the second metatarsophalangeal joint with a palpable Mulder's click to the third interdigital space of the right foot.  Assessment: Neuroma third interspace right foot. Capsulitis second metatarsophalangeal joint right foot.  Plan: I injected the third digital space today with dehydrated alcohol this will be her third dose. I also injected her second metatarsophalangeal joint today with Kenalog and local anesthetic. I will follow-up with her in 1 month.

## 2016-04-07 ENCOUNTER — Ambulatory Visit: Payer: Medicare Other | Attending: Internal Medicine | Admitting: Physical Therapy

## 2016-04-07 ENCOUNTER — Encounter: Payer: Self-pay | Admitting: Physical Therapy

## 2016-04-07 DIAGNOSIS — R2689 Other abnormalities of gait and mobility: Secondary | ICD-10-CM | POA: Diagnosis present

## 2016-04-07 DIAGNOSIS — H8111 Benign paroxysmal vertigo, right ear: Secondary | ICD-10-CM

## 2016-04-07 DIAGNOSIS — R42 Dizziness and giddiness: Secondary | ICD-10-CM

## 2016-04-07 NOTE — Therapy (Signed)
[facility] [address] [place], [place], [address] Phone: [phone]   Fax:  [phone]  Physical Therapy Evaluation  Patient Details  Name: Ebony [name] MRN: [id number] Date of Birth: 12-Oct-1940 Referring Provider: Velna Hatchet, MD  Encounter Date: 04/07/2016      PT End of Session - 04/07/16 1755    Visit Number 1   Number of Visits 9  eval + 8 visits   Date for PT Re-Evaluation 05/07/16   Authorization Type [employer] State   Authorization Time Period G Codes required   PT Start Time 1315   PT Stop Time 1400   PT Time Calculation (min) 45 min   Activity Tolerance Other (comment)  Pt limited by dizziness and nausea   Behavior During Therapy [facility] for tasks assessed/performed      Past Medical History:  Diagnosis Date  . Arthritis   . Cancer ([place])    BASAL CELL CA  . Depression   . GERD (gastroesophageal reflux disease)    TAKES TUMS PRN  . Heart murmur   . Hypertension   . Osteopenia   . Shortness of breath dyspnea    WITH EXERTION  . Stroke ([place])    TIA 2004- PT IS ON PLAVIX  . Tendency toward bleeding easily [facility])     Past Surgical History:  Procedure Laterality Date  . CARDIAC CATHETERIZATION    . KNEE ARTHROPLASTY Right 11/29/2015   Procedure: RIGHT TOTAL KNEE ARTHROPLASTY ;  Surgeon: Rod Can, MD;  Location: [facility];  Service: Orthopedics;  Laterality: Right;  . KNEE ARTHROSCOPY Right 10/05/2013   Procedure: RIGHT KNEE ARTHROSCOPY WITH medial and lateral DEBRIDEMENT chrondroplasty;  Surgeon: [name], MD;  Location: [facility];  Service: Orthopedics;  Laterality: Right;  . MOHS SURGERY ON NOSE 01/2007, MOHS ON FACE 08/2012    . TONSILLECTOMY      There were no vitals filed for this visit.       Subjective Assessment - 04/07/16 1323    Subjective Onset of vertigo a few months ago. Saw PCP, who prescribed meclizine and zofran. Pt states, "I haven't had anymore big episodes...but what  I have now..it's kind of like it's always there. I walk kind o flike I'm drunk. It happens when I turn over in bed, when I bend over to pick something up. But normally if I just kind of stop and wait, I'm okay."   Pertinent History PMH significant for: heart murmur, osteopenia, TIA (2004), heart catheterization (2004), R knee OA, R medial meniscus tear, R TKA (11/2015), depression   Patient Stated Goals "Just to get rid of this."   Currently in Pain? No/denies            [facility] PT Assessment - 04/07/16 0001      Assessment   Medical Diagnosis BPPV   Referring Provider Velna Hatchet, MD   Onset Date/Surgical Date 01/19/16     Precautions   Precautions None     Restrictions   Weight Bearing Restrictions No     Balance Screen   Has the patient fallen in the past 6 months No   Has the patient had a decrease in activity level because of a fear of falling?  Yes   Is the patient reluctant to leave their home because of a fear of falling?  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     Symptom Behavior   Type of Dizziness "World moves"  imbalance   Frequency of Dizziness daily   Duration of Dizziness seconds   Aggravating Factors Turning body quickly;Turning head quickly;Forward bending;Rolling to right;Rolling to left;Supine to sit    Relieving Factors Head stationary;Slow movements     Occulomotor Exam   Occulomotor Alignment Normal   Spontaneous Absent   Gaze-induced Absent   Smooth Pursuits Intact   Saccades Intact     Vestibulo-Occular Reflex   Comment (+) and symptomatic R Head Thrust Test     Positional Testing   Dix-Hallpike --   Sidelying Test Sidelying Right;Sidelying Left   Horizontal Canal Testing Horizontal Canal Right;Horizontal Canal Left     Dix-Hallpike Right   Dix-Hallpike Right Duration 25 seconds   Dix-Hallpike Right Symptoms Upbeat, right rotatory nystagmus     Sidelying Right   Sidelying Right Duration 15 sec   Sidelying Right Symptoms Upbeat, right rotatory nystagmus     Sidelying Left   Sidelying Left Duration NA   Sidelying Left Symptoms No nystagmus     Horizontal Canal Right   Horizontal Canal Right Duration NA   Horizontal Canal Right Symptoms Normal     Horizontal Canal Left   Horizontal Canal Left Duration NA   Horizontal Canal Left Symptoms Normal               [facility] Adult PT Treatment/Exercise - 04/07/16 0001      Ambulation/Gait   Ambulation/Gait Yes   Ambulation/Gait Assistance 5: Supervision;4: Min guard;4: Min assist   Ambulation/Gait Assistance Details (S) for linear gait; min guard for gait with functional head/body turns. Post-treatment, pt required min A, HHA for safe ambulation.   Ambulation Distance (Feet) 75 Feet  x2   Assistive device None   Gait Pattern --  turns en bloc, minimal head movement         Vestibular Treatment/Exercise - 04/07/16 0001      Vestibular Treatment/Exercise   Vestibular Treatment Provided Canalith Repositioning   Canalith Repositioning Epley Manuever Right      EPLEY MANUEVER RIGHT   Number of Reps  1   Response Details  Reassessment of R Dix-Hallpike (-) and asymptomatic; however, motion sensitivity and nausea significantly worse after R Epley.                 PT Short Term Goals - 04/07/16 1800       PT SHORT TERM GOAL #1   Title STG's = LTG's           PT Long Term Goals - 04/07/16 1800      PT LONG TERM GOAL #1   Title Positional vertigo testing will be negative and asymptomatic to indicate resolved BPPV and decreased motion sensitivity.  (Target: 05/05/16)  PT LONG TERM GOAL #2   Title Pt will independently initiate vestibular HEP to maximize functional gains made in PT. (05/05/16)     PT LONG TERM GOAL #3   Title Pt will decrease DHI score from 36 to < / = 18 to indicate significant decrease in pt-perceived disability due to dizziness. (05/05/16)     PT LONG TERM GOAL #4   Title Assess dynamic balance and dynamic gait stability after vertigo clears, if indicated.  (05/05/16)               Plan - 04/07/16 1757    Clinical Impression Statement Pt is a 75 y/o F referred to vestibular PT to address vertigo. PMH significant for: heart murmur, osteopenia, TIA (2004), heart catheterization (2004), R knee OA, R medial meniscus tear, R TKA (11/2015), depression. PT evaluation reveals the following: (+) and symptomatic R Head Thrust Test; R Dix-Hallpike with R upbeating torsional nystagmus x30 seconds accompanied by concordant vertigo. Unable to rule out R posterior canalithiasis and R vestibular hypofunction. Therefore, performed R Epley maneuver x1 trial. Reassessment of R Dix-Hallpike (-) for nystagmus and vertigo; however, pt exhibited significant gait instability and increased nausea and disequilibrium following Epley maneuver. Pt will benefit from skilled outpatient PT 2x/week for up to 4 weeks to address said impairments.    Rehab Potential Good   PT Frequency 2x / week   PT Duration 4 weeks   PT Treatment/Interventions ADLs/Self Care Home Management;Canalith Repostioning;Vestibular;Neuromuscular re-education;Balance training;Therapeutic exercise;Patient/family education;Therapeutic activities;Functional mobility training;Stair training;Gait training   PT Next Visit  Plan Reassess for BPPV and treat prn. Rule out horizontal canal BPPV based on pt nausea post-session. Initiate vestibular HEP Laruth Bouchard Daroff for habituation, gaze stabilization).    Consulted and Agree with Plan of Care Patient      Patient will benefit from skilled therapeutic intervention in order to improve the following deficits and impairments:  Abnormal gait, Decreased balance, Dizziness  Visit Diagnosis: BPPV (benign paroxysmal positional vertigo), right - Plan: PT plan of care cert/re-cert  Dizziness and giddiness - Plan: PT plan of care cert/re-cert  Other abnormalities of gait and mobility - Plan: PT plan of care cert/re-cert      G-Codes - [id number] 1800    Functional Assessment Tool Used DHI = 36   Functional Limitation Self care   Self Care Current Status [id number]) At least 20 percent but less than 40 percent impaired, limited or restricted   Self Care Goal Status [id number]) At least 1 percent but less than 20 percent impaired, limited or restricted       Problem List Patient Active Problem List   Diagnosis Date Noted  . Tachycardia 02/04/2016  . Dizziness 02/04/2016  . Primary osteoarthritis of right knee 11/29/2015  . Acute medial meniscal tear 10/04/2013    Billie Ruddy, PT, DPT [facility] [address] [place], [place], [address] Phone: [phone]   Fax:  [phone] 04/07/16, 6:04 PM  Name: SHAQUNA GONZAGA MRN: [id number] Date of Birth: 1941-06-03

## 2016-04-08 ENCOUNTER — Ambulatory Visit: Payer: Medicare Other | Admitting: Physical Therapy

## 2016-04-08 DIAGNOSIS — R42 Dizziness and giddiness: Secondary | ICD-10-CM

## 2016-04-08 DIAGNOSIS — H8111 Benign paroxysmal vertigo, right ear: Secondary | ICD-10-CM | POA: Diagnosis not present

## 2016-04-08 DIAGNOSIS — R2689 Other abnormalities of gait and mobility: Secondary | ICD-10-CM

## 2016-04-08 NOTE — Patient Instructions (Signed)
Tip Card 1.The goal of habituation training is to assist in decreasing symptoms of vertigo, dizziness, or nausea provoked by specific head and body motions. 2.These exercises may initially increase symptoms; however, be persistent and work through symptoms. With repetition and time, the exercises will assist in reducing or eliminating symptoms. 3.Exercises should be stopped and discussed with the therapist if you experience any of the following: - Sudden change or fluctuation in hearing - New onset of ringing in the ears, or increase in current intensity - Any fluid discharge from the ear - Severe pain in neck or back - Extreme nausea  Copyright  VHI. All rights reserved.  Sit to Side-Lying   Sit on edge of bed. Lie down onto the right side and hold until dizziness stops, plus 20 seconds.  Return to sitting and wait until dizziness stops, plus 20 seconds.  Repeat to the left side. Repeat sequence 5 times per session. Do 2 sessions per day.  Copyright  VHI. All rights reserved.  Gaze Stabilization: Tip Card 1.Target must remain in focus, not blurry, and appear stationary while head is in motion. 2.Perform exercises with small head movements (45 to either side of midline). 3.Increase speed of head motion so long as target is in focus. 4.If you wear eyeglasses, be sure you can see target through lens (therapist will give specific instructions for bifocal / progressive lenses). 5.These exercises may provoke dizziness or nausea. Work through these symptoms. If too dizzy, slow head movement slightly. Rest between each exercise. 6.Exercises demand concentration; avoid distractions. 7.For safety, perform standing exercises close to a counter, wall, corner, or next to someone.   Gaze Stabilization: Sitting    Wear your eyeglasses and sit in a chair for this exercise. Keeping eyes on target ("A") on wall 3 feet away and at eye-level, tilt head down slightly and move head side to side (as though  you're shaking your head "no") for __30__ seconds.  Do __2__ sessions per day.  Copyright  VHI. All rights reserved.

## 2016-04-08 NOTE — Therapy (Signed)
[facility] [address] [place], [place], [address] Phone: [phone]   Fax:  [phone]  Physical Therapy Treatment  Patient Details  Name: Ebony [name] MRN: [id number] Date of Birth: May 07, 1941 Referring Provider: Velna Hatchet, MD  Encounter Date: 04/08/2016      PT End of Session - 04/08/16 1709    Visit Number 2   Number of Visits 9   Date for PT Re-Evaluation 05/07/16   Authorization Type [employer] State   Authorization Time Period G Codes required   PT Start Time 1531   PT Stop Time 1615   PT Time Calculation (min) 44 min   Activity Tolerance Other (comment)  Frequent prolonged rest breaks due to nausea   Behavior During Therapy [facility] for tasks assessed/performed      Past Medical History:  Diagnosis Date  . Arthritis   . Cancer ([place])    BASAL CELL CA  . Depression   . GERD (gastroesophageal reflux disease)    TAKES TUMS PRN  . Heart murmur   . Hypertension   . Osteopenia   . Shortness of breath dyspnea    WITH EXERTION  . Stroke ([place])    TIA 2004- PT IS ON PLAVIX  . Tendency toward bleeding easily [facility])     Past Surgical History:  Procedure Laterality Date  . CARDIAC CATHETERIZATION    . KNEE ARTHROPLASTY Right 11/29/2015   Procedure: RIGHT TOTAL KNEE ARTHROPLASTY ;  Surgeon: Rod Can, MD;  [facility];  Service: Orthopedics;  Laterality: Right;  . KNEE ARTHROSCOPY Right 10/05/2013   Procedure: RIGHT KNEE ARTHROSCOPY WITH medial and lateral DEBRIDEMENT chrondroplasty;  Surgeon: [name], MD;  [facility];  Service: Orthopedics;  Laterality: Right;  . MOHS SURGERY ON NOSE 01/2007, MOHS ON FACE 08/2012    . TONSILLECTOMY      There were no vitals filed for this visit.      Subjective Assessment - 04/08/16 1536    Subjective "I couldn't have gotten out of bed this morning...and I wouldn't have been able to get here first thing this morning. I was bouncing off the walls.  This morning I took a pill (Zofran) and I never actually got sick but I was nauseated."   Pertinent History PMH significant for: heart murmur, osteopenia, TIA (2004), heart catheterization (2004), R knee OA, R medial meniscus tear, R TKA (11/2015), depression   Patient Stated Goals "Just to get rid of this."   Currently in Pain? No/denies                Vestibular Assessment - 04/08/16 0001      Positional Testing   Horizontal Canal Testing Horizontal Canal Right;Horizontal Canal Left;Horizontal Canal Right Intensity;Horizontal Canal Left Intensity     Dix-Hallpike Right   Dix-Hallpike Right Duration 15 sec   Dix-Hallpike Right Symptoms Upbeat, right rotatory nystagmus     Horizontal Canal Right   Horizontal Canal Right Duration 10 sec  difficult to discern if horizontal vs. torsional   Horizontal Canal Right Symptoms Geotrophic;Nystagmus     Horizontal Canal Left   Horizontal Canal Left Duration NA   Horizontal Canal Left Symptoms Normal     Horizontal Canal Right Intensity   Horizontal Canal Right Intensity Mild     Horizontal Canal Left Intensity   Left Intensity Comment NA; no symptoms                  Vestibular Treatment/Exercise - 04/08/16  0001      Vestibular Treatment/Exercise   Vestibular Treatment Provided Canalith Repositioning   Canalith Repositioning Epley Manuever Right   Habituation Exercises Loss adjuster, chartered   Gaze Exercises X1 Viewing Horizontal      EPLEY MANUEVER RIGHT   Number of Reps  2   Overall Response Improved Symptoms   Response Details  After second R Epley maneuver, R Dix-Hallpike (-) and asymptomatic     Nestor Lewandowsky   Number of Reps  2   Symptom Description  Verbal cueing for technique with effective return demo     X1 Viewing Horizontal   Foot Position seated   Reps 2   Comments 2 x30-sec trials with cueing for technique               PT Education - 04/08/16 1715    Education provided Yes   Education  Details HEP initiated; see Pt Instructions.    Person(s) Educated Patient   Methods Explanation;Verbal cues;Handout;Demonstration   Comprehension Returned demonstration;Verbalized understanding          PT Short Term Goals - 04/24/2016 1800      PT SHORT TERM GOAL #1   Title STG's = LTG's           PT Long Term Goals - Apr 24, 2016 1800      PT LONG TERM GOAL #1   Title Positional vertigo testing will be negative and asymptomatic to indicate resolved BPPV and decreased motion sensitivity.  (Target: 05/05/16)     PT LONG TERM GOAL #2   Title Pt will independently initiate vestibular HEP to maximize functional gains made in PT. (05/05/16)     PT LONG TERM GOAL #3   Title Pt will decrease DHI score from 36 to < / = 18 to indicate significant decrease in pt-perceived disability due to dizziness. (05/05/16)     PT LONG TERM GOAL #4   Title Assess dynamic balance and dynamic gait stability after vertigo clears, if indicated.  (05/05/16)               Plan - 04/08/16 1711    Clinical Impression Statement R Dix-Hallpike (+) for R upbeating, torsional nystagmus x15 seconds accompanied by vertigo. Performed R Epley maneuver x2 trials. Reassessment reveals (-) and asymptomatic R Dix-Hallpike. Post-session, pt reports symptoms much improved. Initiated HEP for habituation and gaze stabilization.    Rehab Potential Good   PT Frequency 2x / week   PT Duration 4 weeks   PT Treatment/Interventions ADLs/Self Care Home Management;Canalith Repostioning;Vestibular;Neuromuscular re-education;Balance training;Therapeutic exercise;Patient/family education;Therapeutic activities;Functional mobility training;Stair training;Gait training   PT Next Visit Plan Reassess for BPPV and treat prn. Assess vestibular HEP and progress (gaze stabilization from sitting to standing), as appropriate. Consider adding gait with head turns to HEP, when tolerated by pt.   Consulted and Agree with Plan of Care Patient       Patient will benefit from skilled therapeutic intervention in order to improve the following deficits and impairments:  Abnormal gait, Decreased balance, Dizziness  Visit Diagnosis: BPPV (benign paroxysmal positional vertigo), right  Dizziness and giddiness  Other abnormalities of gait and mobility       G-Codes - 2016/04/24 1800    Functional Assessment Tool Used DHI = 36   Functional Limitation Self care   Self Care Current Status [id number]) At least 20 percent but less than 40 percent impaired, limited or restricted   Self Care Goal Status [id number]) At least 1 percent but less than 20 percent  impaired, limited or restricted      Problem List Patient Active Problem List   Diagnosis Date Noted  . Tachycardia 02/04/2016  . Dizziness 02/04/2016  . Primary osteoarthritis of right knee 11/29/2015  . Acute medial meniscal tear 10/04/2013   Billie Ruddy, PT, DPT [facility] [address] [place], [place], [address] Phone: [phone]   Fax:  [phone] 04/08/16, 5:17 PM  Name: Ebony [name] MRN: [id number] Date of Birth: Nov 28, 1940

## 2016-04-10 ENCOUNTER — Ambulatory Visit: Payer: Medicare Other | Admitting: Physical Therapy

## 2016-04-10 DIAGNOSIS — R42 Dizziness and giddiness: Secondary | ICD-10-CM

## 2016-04-10 DIAGNOSIS — R2689 Other abnormalities of gait and mobility: Secondary | ICD-10-CM

## 2016-04-10 DIAGNOSIS — H8111 Benign paroxysmal vertigo, right ear: Secondary | ICD-10-CM | POA: Diagnosis not present

## 2016-04-10 NOTE — Therapy (Signed)
[facility] [address] [place], [place], [address] Phone: [phone]   Fax:  [phone]  Physical Therapy Treatment  Patient Details  Name: TANGANIKA MEINEKE MRN: [id number] Date of Birth: 1941/04/13 Referring Provider: Velna Hatchet, MD  Encounter Date: 04/10/2016      PT End of Session - 04/10/16 1707    Visit Number 3   Number of Visits 9   Date for PT Re-Evaluation 05/07/16   Authorization Type [employer] State   Authorization Time Period G Codes required   PT Start Time [id number]   PT Stop Time [id number]  Session ended early due to nausea   PT Time Calculation (min) 35 min   Activity Tolerance Other (comment)  Limited by nausea   Behavior During Therapy [facility] for tasks assessed/performed      Past Medical History:  Diagnosis Date  . Arthritis   . Cancer ([place])    BASAL CELL CA  . Depression   . GERD (gastroesophageal reflux disease)    TAKES TUMS PRN  . Heart murmur   . Hypertension   . Osteopenia   . Shortness of breath dyspnea    WITH EXERTION  . Stroke ([place])    TIA 2004- PT IS ON PLAVIX  . Tendency toward bleeding easily [facility])     Past Surgical History:  Procedure Laterality Date  . CARDIAC CATHETERIZATION    . KNEE ARTHROPLASTY Right 11/29/2015   Procedure: RIGHT TOTAL KNEE ARTHROPLASTY ;  Surgeon: Rod Can, MD;  Location: [facility];  Service: Orthopedics;  Laterality: Right;  . KNEE ARTHROSCOPY Right 10/05/2013   Procedure: RIGHT KNEE ARTHROSCOPY WITH medial and lateral DEBRIDEMENT chrondroplasty;  Surgeon: [name], MD;  Location: [facility];  Service: Orthopedics;  Laterality: Right;  . MOHS SURGERY ON NOSE 01/2007, MOHS ON FACE 08/2012    . TONSILLECTOMY      There were no vitals filed for this visit.      Subjective Assessment - 04/10/16 1318    Subjective "Yesterday was perfect...until I did the exercises. Then, I did the exercises and got a little dizzy...only for a split second though, and  only on the right side."   Pertinent History PMH significant for: heart murmur, osteopenia, TIA (2004), heart catheterization (2004), R knee OA, R medial meniscus tear, R TKA (11/2015), depression   Patient Stated Goals "Just to get rid of this."   Currently in Pain? No/denies                Vestibular Assessment - 04/10/16 0001      Positional Testing   Dix-Hallpike Dix-Hallpike Right   Horizontal Canal Testing Horizontal Canal Right;Horizontal Canal Left     Dix-Hallpike Right   Dix-Hallpike Right Duration 6 sec   Dix-Hallpike Right Symptoms Upbeat, right rotatory nystagmus     Sidelying Right   Sidelying Right Duration 5 sec   Sidelying Right Symptoms Upbeat, right rotatory nystagmus     Horizontal Canal Right   Horizontal Canal Right Duration NA   Horizontal Canal Right Symptoms Normal     Horizontal Canal Left   Horizontal Canal Left Duration NA   Horizontal Canal Left Symptoms Normal                 [facility] Adult PT Treatment/Exercise - 04/10/16 0001      Ambulation/Gait   Ambulation/Gait Yes   Ambulation/Gait Assistance 6: Modified independent (Device/Increase time)         Vestibular Treatment/Exercise -  04/10/16 0001      Vestibular Treatment/Exercise   Vestibular Treatment Provided Canalith Repositioning   Canalith Repositioning Epley Manuever Right   Habituation Exercises [employer]   Gaze Exercises X1 Viewing Horizontal;X1 Viewing Vertical      EPLEY MANUEVER RIGHT   Number of Reps  2   Overall Response Improved Symptoms   Response Details  After second Epley maneuver, all nystagmus and symptoms of vertigo resolved upon reassessment of R Dix-Hallpike; however, pt with increased nausea.     Nestor Lewandowsky   Number of Reps  2   Symptom Description  required cueing for tecnnique (cervical spine rotation); pt requires increased time in each position due to nausea.     X1 Viewing Horizontal   Foot Position standing; feet apart   Reps 1    Comments x30 seconds; cueing for technique     X1 Viewing Vertical   Foot Position --  deferred due to progressive lenses               PT Education - 04/10/16 1353    Education provided Yes   Education Details HEP: progressed x1 viewing from seated to standing; recommended pt continue Nestor Lewandowsky for habituation.    Person(s) Educated Patient   Methods Explanation;Demonstration;Verbal cues;Handout   Comprehension Verbalized understanding;Returned demonstration          PT Short Term Goals - 04/07/16 1800      PT SHORT TERM GOAL #1   Title STG's = LTG's           PT Long Term Goals - 04/07/16 1800      PT LONG TERM GOAL #1   Title Positional vertigo testing will be negative and asymptomatic to indicate resolved BPPV and decreased motion sensitivity.  (Target: 05/05/16)     PT LONG TERM GOAL #2   Title Pt will independently initiate vestibular HEP to maximize functional gains made in PT. (05/05/16)     PT LONG TERM GOAL #3   Title Pt will decrease DHI score from 36 to < / = 18 to indicate significant decrease in pt-perceived disability due to dizziness. (05/05/16)     PT LONG TERM GOAL #4   Title Assess dynamic balance and dynamic gait stability after vertigo clears, if indicated.  (05/05/16)               Plan - 04/10/16 1356    Clinical Impression Statement R Dix-Hallpike (+) for R upbeating, torsional nystagmus x6 seconds accompanied by vertigo. After R Epley manuever x2, reassessment of R Dix-Hallpike (-) and asymptomatic. Progressed gaze stabilization HEP from seated to standing and recommended pt continue Nestor Lewandowsky for habituation at home. Gait stability much improved during this session as compared with previous sessions. Session ended earlier than scheduled due to significant nausea.    Rehab Potential Good   PT Frequency 2x / week   PT Duration 4 weeks   PT Treatment/Interventions ADLs/Self Care Home Management;Canalith  Repostioning;Vestibular;Neuromuscular re-education;Balance training;Therapeutic exercise;Patient/family education;Therapeutic activities;Functional mobility training;Stair training;Gait training   PT Next Visit Plan Reassess for BPPV (R PC) and treat prn. Assess vestibular HEP and progress (gaze stabilization from sitting to standing), as appropriate. Consider adding gait with head turns to HEP, when tolerated by pt.   Consulted and Agree with Plan of Care Patient      Patient will benefit from skilled therapeutic intervention in order to improve the following deficits and impairments:  Abnormal gait, Decreased balance, Dizziness  Visit Diagnosis: BPPV (  benign paroxysmal positional vertigo), right  Dizziness and giddiness  Other abnormalities of gait and mobility     Problem List Patient Active Problem List   Diagnosis Date Noted  . Tachycardia 02/04/2016  . Dizziness 02/04/2016  . Primary osteoarthritis of right knee 11/29/2015  . Acute medial meniscal tear 10/04/2013    Billie Ruddy, PT, DPT [facility] [address] [place], [place], [address] Phone: [phone]   Fax:  [phone] 04/10/16, 5:08 PM  Name: PENNY MANOLIS MRN: [id number] Date of Birth: [birth year]

## 2016-04-10 NOTE — Patient Instructions (Signed)
Tip Card 1.The goal of habituation training is to assist in decreasing symptoms of vertigo, dizziness, or nausea provoked by specific head and body motions. 2.These exercises may initially increase symptoms; however, be persistent and work through symptoms. With repetition and time, the exercises will assist in reducing or eliminating symptoms. 3.Exercises should be stopped and discussed with the therapist if you experience any of the following: - Sudden change or fluctuation in hearing - New onset of ringing in the ears, or increase in current intensity - Any fluid discharge from the ear - Severe pain in neck or back - Extreme nausea  Copyright  VHI. All rights reserved.  Sit to Side-Lying   Sit on edge of bed. Lie down onto the right side and hold until dizziness stops, plus 20 seconds.  Return to sitting and wait until dizziness stops, plus 20 seconds.  Repeat to the left side. Repeat sequence 5 times per session. Do 2 sessions per day.  Copyright  VHI. All rights reserved.  Gaze Stabilization: Tip Card 1.Target must remain in focus, not blurry, and appear stationary while head is in motion. 2.Perform exercises with small head movements (45 to either side of midline). 3.Increase speed of head motion so long as target is in focus. 4.If you wear eyeglasses, be sure you can see target through lens (therapist will give specific instructions for bifocal / progressive lenses). 5.These exercises may provoke dizziness or nausea. Work through these symptoms. If too dizzy, slow head movement slightly. Rest between each exercise. 6.Exercises demand concentration; avoid distractions. 7.For safety, perform standing exercises close to a counter, wall, corner, or next to someone.   Gaze Stabilization: Standing Feet Apart     Stand with feet shoulder width apart. Keeping eyes on target ("A") on wall 3 feet away and at eye-level, tilt head down slightly and move head side to side (as though you're  shaking your head "no") for __30__ seconds.  Do __2__ sessions per day.  Copyright  VHI. All rights reserved.

## 2016-04-15 ENCOUNTER — Ambulatory Visit: Payer: Medicare Other | Admitting: Physical Therapy

## 2016-04-15 DIAGNOSIS — R42 Dizziness and giddiness: Secondary | ICD-10-CM

## 2016-04-15 DIAGNOSIS — H8111 Benign paroxysmal vertigo, right ear: Secondary | ICD-10-CM | POA: Diagnosis not present

## 2016-04-15 NOTE — Therapy (Signed)
[facility] [address] [place], [place], [address] Phone: [phone]   Fax:  [phone]  Physical Therapy Treatment  Patient Details  Name: Ebony [name] MRN: [id number] Date of Birth: 04-01-1941 Referring Provider: Velna Hatchet, MD  Encounter Date: 04/15/2016      PT End of Session - 04/15/16 1709    Visit Number 4   Number of Visits 9   Date for PT Re-Evaluation 05/07/16   Authorization Type [employer] State   Authorization Time Period G Codes required   PT Start Time 1406   PT Stop Time [id number]  Session ended early due to vomiting   PT Time Calculation (min) 29 min   Activity Tolerance Other (comment)  Limited by nausea, vomiting   Behavior During Therapy [facility] for tasks assessed/performed      Past Medical History:  Diagnosis Date  . Arthritis   . Cancer ([place])    BASAL CELL CA  . Depression   . GERD (gastroesophageal reflux disease)    TAKES TUMS PRN  . Heart murmur   . Hypertension   . Osteopenia   . Shortness of breath dyspnea    WITH EXERTION  . Stroke ([place])    TIA 2004- PT IS ON PLAVIX  . Tendency toward bleeding easily [facility])     Past Surgical History:  Procedure Laterality Date  . CARDIAC CATHETERIZATION    . KNEE ARTHROPLASTY Right 11/29/2015   Procedure: RIGHT TOTAL KNEE ARTHROPLASTY ;  Surgeon: Rod Can, MD;  [facility];  Service: Orthopedics;  Laterality: Right;  . KNEE ARTHROSCOPY Right 10/05/2013   Procedure: RIGHT KNEE ARTHROSCOPY WITH medial and lateral DEBRIDEMENT chrondroplasty;  Surgeon: [name], MD;  [facility];  Service: Orthopedics;  Laterality: Right;  . MOHS SURGERY ON NOSE 01/2007, MOHS ON FACE 08/2012    . TONSILLECTOMY      There were no vitals filed for this visit.      Subjective Assessment - 04/15/16 1429    Subjective "I don't think I need to come here anymore. I think I'm better."   Pertinent History PMH significant for: heart murmur,  osteopenia, TIA (2004), heart catheterization (2004), R knee OA, R medial meniscus tear, R TKA (11/2015), depression   Patient Stated Goals "Just to get rid of this."   Currently in Pain? No/denies                Vestibular Assessment - 04/15/16 0001      Positional Testing   Sidelying Test Sidelying Right;Sidelying Left   Horizontal Canal Testing Horizontal Canal Right;Horizontal Canal Left     Dix-Hallpike Right   Dix-Hallpike Right Duration 25 sec   Dix-Hallpike Right Symptoms Upbeat, right rotatory nystagmus     Sidelying Right   Sidelying Right Duration 7 sec   Sidelying Right Symptoms Upbeat, right rotatory nystagmus     Sidelying Left   Sidelying Left Duration NA   Sidelying Left Symptoms No nystagmus     Horizontal Canal Right   Horizontal Canal Right Duration NA   Horizontal Canal Right Symptoms Normal     Horizontal Canal Left   Horizontal Canal Left Duration NA   Horizontal Canal Left Symptoms Normal                  Vestibular Treatment/Exercise - 04/15/16 0001      Vestibular Treatment/Exercise   Vestibular Treatment Provided Canalith Repositioning   Canalith Repositioning Epley Manuever Right;Semont Procedure Right Posterior  EPLEY MANUEVER RIGHT   Number of Reps  3   Overall Response Symptoms Worsened   Response Details  Performed R Epley maneuver x2 trials; however, no significant improvement in nystagmus duration/intensity. Therefore, performed R PC Semont maneuver (see below); after Semont maneuver, performed 1 additional R Epley maneuver      Semont Procedure Right Posterior   Number of Reps  1   Overall Response  Symptoms Worsened                 PT Short Term Goals - 04/07/16 1800      PT SHORT TERM GOAL #1   Title STG's = LTG's           PT Long Term Goals - 04/07/16 1800      PT LONG TERM GOAL #1   Title Positional vertigo testing will be negative and asymptomatic to indicate resolved BPPV and  decreased motion sensitivity.  (Target: 05/05/16)     PT LONG TERM GOAL #2   Title Pt will independently initiate vestibular HEP to maximize functional gains made in PT. (05/05/16)     PT LONG TERM GOAL #3   Title Pt will decrease DHI score from 36 to < / = 18 to indicate significant decrease in pt-perceived disability due to dizziness. (05/05/16)     PT LONG TERM GOAL #4   Title Assess dynamic balance and dynamic gait stability after vertigo clears, if indicated.  (05/05/16)               Plan - 04/15/16 1713    Clinical Impression Statement R Dix-Hallpike continues to be (+) for upbeating, torsional nystagmus. Therefore, unable to rule out ongoing R posterior canalithiasis vs cupuloilithasis (duration of nystagmus up to 25 seconds today). Therefore, performed R Epley x2 trials with no noted change in nystagmus intensity/duration. Therefore, performed R PC Semont Maneuver x1. After rest break, performed 1 additional R Epley mameuver. At this time, session ended due to nausea/vomiting. After vomiting, pt felt much better. Post-session, pt able to safely ambulate from gym to checkout without gait instability.    Rehab Potential Good   PT Frequency 2x / week   PT Duration 4 weeks   PT Treatment/Interventions ADLs/Self Care Home Management;Canalith Repostioning;Vestibular;Neuromuscular re-education;Balance training;Therapeutic exercise;Patient/family education;Therapeutic activities;Functional mobility training;Stair training;Gait training   PT Next Visit Plan Reassess for BPPV (R PC) and treat prn.  Assess vestibular HEP and progress (gaze stabilization from sitting to standing), as appropriate. Consider adding gait with head turns to HEP, when tolerated by pt.   Consulted and Agree with Plan of Care Patient      Patient will benefit from skilled therapeutic intervention in order to improve the following deficits and impairments:  Abnormal gait, Decreased balance, Dizziness  Visit  Diagnosis: BPPV (benign paroxysmal positional vertigo), right  Dizziness and giddiness     Problem List Patient Active Problem List   Diagnosis Date Noted  . Tachycardia 02/04/2016  . Dizziness 02/04/2016  . Primary osteoarthritis of right knee 11/29/2015  . Acute medial meniscal tear 10/04/2013   Billie Ruddy, PT, DPT [facility] [address] [place], [place], [address] Phone: [phone]   Fax:  [phone] 04/15/16, 5:18 PM  Name: Ebony [name] MRN: [id number] Date of Birth: [birth year]

## 2016-04-17 ENCOUNTER — Encounter: Payer: Self-pay | Admitting: Physical Therapy

## 2016-04-22 ENCOUNTER — Ambulatory Visit: Payer: Medicare Other | Admitting: Physical Therapy

## 2016-04-25 ENCOUNTER — Ambulatory Visit: Payer: Medicare Other | Attending: Internal Medicine | Admitting: Physical Therapy

## 2016-04-25 DIAGNOSIS — H8111 Benign paroxysmal vertigo, right ear: Secondary | ICD-10-CM | POA: Diagnosis present

## 2016-04-25 DIAGNOSIS — R42 Dizziness and giddiness: Secondary | ICD-10-CM | POA: Diagnosis present

## 2016-04-25 NOTE — Patient Instructions (Signed)
Tip Card 1.The goal of habituation training is to assist in decreasing symptoms of vertigo, dizziness, or nausea provoked by specific head and body motions. 2.These exercises may initially increase symptoms; however, be persistent and work through symptoms. With repetition and time, the exercises will assist in reducing or eliminating symptoms. 3.Exercises should be stopped and discussed with the therapist if you experience any of the following: - Sudden change or fluctuation in hearing - New onset of ringing in the ears, or increase in current intensity - Any fluid discharge from the ear - Severe pain in neck or back - Extreme nausea  Copyright  VHI. All rights reserved.  Rolling   With pillow under head, start on back. Roll to your right side.  Hold until dizziness stops, plus 20 seconds and then roll to the left side.  Hold until dizziness stops, plus 20 seconds.  Repeat sequence 5 times per session. Do 2 sessions per day.       

## 2016-04-26 NOTE — Therapy (Signed)
[facility] [address] [place], [place], [address] Phone: [phone]   Fax:  [phone]  Physical Therapy Treatment  Patient Details  Name: Ebony [name] MRN: [id number] Date of Birth: 13-May-1941 Referring Provider: Velna Hatchet, MD  Encounter Date: 04/25/2016      PT End of Session - 04/26/16 1042    Visit Number 5   Number of Visits 9   Date for PT Re-Evaluation 05/07/16   Authorization Type [employer] State   Authorization Time Period G Codes required   PT Start Time [id number]   PT Stop Time 1614   PT Time Calculation (min) 43 min   Activity Tolerance Other (comment)  Rest breaks required due to significant nausea   Behavior During Therapy [facility] for tasks assessed/performed      Past Medical History:  Diagnosis Date  . Arthritis   . Cancer ([place])    BASAL CELL CA  . Depression   . GERD (gastroesophageal reflux disease)    TAKES TUMS PRN  . Heart murmur   . Hypertension   . Osteopenia   . Shortness of breath dyspnea    WITH EXERTION  . Stroke ([place])    TIA 2004- PT IS ON PLAVIX  . Tendency toward bleeding easily [facility])     Past Surgical History:  Procedure Laterality Date  . CARDIAC CATHETERIZATION    . KNEE ARTHROPLASTY Right 11/29/2015   Procedure: RIGHT TOTAL KNEE ARTHROPLASTY ;  Surgeon: Rod Can, MD;  [facility];  Service: Orthopedics;  Laterality: Right;  . KNEE ARTHROSCOPY Right 10/05/2013   Procedure: RIGHT KNEE ARTHROSCOPY WITH medial and lateral DEBRIDEMENT chrondroplasty;  Surgeon: [name], MD;  [facility];  Service: Orthopedics;  Laterality: Right;  . MOHS SURGERY ON NOSE 01/2007, MOHS ON FACE 08/2012    . TONSILLECTOMY      There were no vitals filed for this visit.      Subjective Assessment - 04/26/16 1028    Subjective "It bothers me only when I'm looking up."   Pertinent History PMH significant for: heart murmur, osteopenia, TIA (2004), heart catheterization  (2004), R knee OA, R medial meniscus tear, R TKA (11/2015), depression   Patient Stated Goals "Just to get rid of this."   Currently in Pain? No/denies                Vestibular Assessment - 04/26/16 0001      Positional Testing   Dix-Hallpike Dix-Hallpike Right   Sidelying Test Sidelying Right;Sidelying Left   Horizontal Canal Testing Horizontal Canal Right;Horizontal Canal Left;Horizontal Canal Right Intensity;Horizontal Canal Left Intensity     Dix-Hallpike Right   Dix-Hallpike Right Duration 6-8 sec  5-second latency   Dix-Hallpike Right Symptoms Upbeat, right rotatory nystagmus     Sidelying Right   Sidelying Right Duration 5 sec   Sidelying Right Symptoms Upbeat, right rotatory nystagmus     Sidelying Left   Sidelying Left Duration NA   Sidelying Left Symptoms No nystagmus     Horizontal Canal Right   Horizontal Canal Right Duration >60 seconds   Horizontal Canal Right Symptoms Geotrophic;Nystagmus  Assessed after R Epley performed     Horizontal Canal Left   Horizontal Canal Left Duration >10 seconds  did not remain in position due to severe nausea   Horizontal Canal Left Symptoms Geotrophic;Nystagmus     Horizontal Canal Right Intensity   Horizontal Canal Right Intensity Severe     Horizontal Canal Left Intensity  Horizontal Canal Left Intensity Moderate                  Vestibular Treatment/Exercise - 04/26/16 0001      Vestibular Treatment/Exercise   Vestibular Treatment Provided Canalith Repositioning;Habituation   Canalith Repositioning Epley Manuever Right;Canal Roll Right   Habituation Exercises --      EPLEY MANUEVER RIGHT   Number of Reps  2   Overall Response Symptoms Worsened   Response Details  After initial R Epley maneuver, reassessment of R Dix-Hallpike reveals continued R upbeating, torsional nystagmus x5 seconds. Therefore, proceeded to second R Epley maneuver. During second positon of R Epley (cervical extension, L  rotation), noted L-beating horizontal nystagmus and onset of significant vertiginous symptoms and nausea. Therefore, assessed horizontal roll test, which revealed significant horizontal geotrophic nystagmus, with severity of symptoms and nystagmus on R > L.      Canal Roll Right   Number of Reps  2   Overall Response  Improved Symptoms   Response Details  After initial R Canal Roll, reassessment of R Supine Roll Test reveals horizontal geotrophic nyustagmus x10 seconds. Therefore, performed one additional R Canal Roll maneuver. Reassessment not performed due to time constraint; however, pt perceived improved symptoms.               PT Education - 04/26/16 1028    Education provided Yes   Education Details Education on horizontal canalithiasis and treatment. HEP: asked that pt focus on horizontal rolling for habituation until next session.    Person(s) Educated Patient   Methods Explanation;Demonstration;Verbal cues;Handout   Comprehension Verbalized understanding;Returned demonstration          PT Short Term Goals - 04/07/16 1800      PT SHORT TERM GOAL #1   Title STG's = LTG's           PT Long Term Goals - 04/07/16 1800      PT LONG TERM GOAL #1   Title Positional vertigo testing will be negative and asymptomatic to indicate resolved BPPV and decreased motion sensitivity.  (Target: 05/05/16)     PT LONG TERM GOAL #2   Title Pt will independently initiate vestibular HEP to maximize functional gains made in PT. (05/05/16)     PT LONG TERM GOAL #3   Title Pt will decrease DHI score from 36 to < / = 18 to indicate significant decrease in pt-perceived disability due to dizziness. (05/05/16)     PT LONG TERM GOAL #4   Title Assess dynamic balance and dynamic gait stability after vertigo clears, if indicated.  (05/05/16)               Plan - 04/26/16 1043    Clinical Impression Statement R Dix-Hallpike reveals R upbeating, torsional nystagmus x6-8 seconds with  5-second latency. Therefore, unable to rule out R posterior canalithiasis. Performed R Epley maneuver x2 reps. During second position of R Epley maneuver (cervical extension, L rotation), noted L-beating horizontal nystagmus and onset of significant vertigo and nausea. Supine Roll Test revealed significant horizontal geotrophic nystagmus, with severity of symptoms and nystagmus on R > L. Unable to rule out conversion of R posterior canalithiasis to R horizontal canalithiasis. Performed R Canal Roll maneuver x2 reps with marked improvement in symptom intensity/nystagmus duration after initial trial. Educated pt on horizontal rolling for habituation.   Rehab Potential Good   PT Frequency 2x / week   PT Duration 4 weeks   PT Treatment/Interventions ADLs/Self Care Home Management;Canalith  Repostioning;Vestibular;Neuromuscular re-education;Balance training;Therapeutic exercise;Patient/family education;Therapeutic activities;Functional mobility training;Stair training;Gait training   PT Next Visit Plan Reassess for BPPV (R HC) and treat prn.    PT Home Exercise Plan 10/7: modified HEP to focus on rolling for HC habituation   Consulted and Agree with Plan of Care Patient      Patient will benefit from skilled therapeutic intervention in order to improve the following deficits and impairments:  Abnormal gait, Decreased balance, Dizziness  Visit Diagnosis: BPPV (benign paroxysmal positional vertigo), right  Dizziness and giddiness     Problem List Patient Active Problem List   Diagnosis Date Noted  . Tachycardia 02/04/2016  . Dizziness 02/04/2016  . Primary osteoarthritis of right knee 11/29/2015  . Acute medial meniscal tear 10/04/2013    Billie Ruddy, PT, DPT [facility] [address] [place], [place], [address] Phone: [phone]   Fax:  [phone] 04/26/16, 10:51 AM  Name: Ebony [name] MRN: [id number] Date of Birth: May 28, 1941

## 2016-04-28 ENCOUNTER — Ambulatory Visit: Payer: Medicare Other | Admitting: Physical Therapy

## 2016-04-29 ENCOUNTER — Encounter: Payer: Self-pay | Admitting: Podiatry

## 2016-04-29 ENCOUNTER — Ambulatory Visit (INDEPENDENT_AMBULATORY_CARE_PROVIDER_SITE_OTHER): Payer: Medicare Other | Admitting: Podiatry

## 2016-04-29 DIAGNOSIS — M7751 Other enthesopathy of right foot: Secondary | ICD-10-CM

## 2016-04-29 DIAGNOSIS — G5761 Lesion of plantar nerve, right lower limb: Secondary | ICD-10-CM | POA: Diagnosis not present

## 2016-04-29 DIAGNOSIS — M778 Other enthesopathies, not elsewhere classified: Secondary | ICD-10-CM

## 2016-04-29 DIAGNOSIS — M779 Enthesopathy, unspecified: Secondary | ICD-10-CM

## 2016-04-30 NOTE — Progress Notes (Signed)
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 She states that she is 100% improved.  Objective: Vital signs are stable alert and oriented 3. Pulses are palpable. Neurologic sensorium is intact. She has no palpable pain no reproducible pain.  Assessment: Well-healing Morton's neuroma right foot.  Plan: I encouraged follow-up with me with any regression and will follow up with her on an as-needed basis.

## 2016-05-02 ENCOUNTER — Ambulatory Visit: Payer: Medicare Other | Admitting: Physical Therapy

## 2016-05-05 ENCOUNTER — Encounter: Payer: Medicare Other | Admitting: Physical Therapy

## 2016-05-08 ENCOUNTER — Encounter: Payer: Self-pay | Admitting: Physical Therapy

## 2016-05-08 ENCOUNTER — Ambulatory Visit: Payer: Medicare Other | Admitting: Physical Therapy

## 2016-05-08 DIAGNOSIS — H8111 Benign paroxysmal vertigo, right ear: Secondary | ICD-10-CM | POA: Diagnosis not present

## 2016-05-08 NOTE — Therapy (Signed)
[facility] [address] [place], [place], [address] Phone: [phone]   Fax:  [phone]  Patient Details  Name: Ebony [name] MRN: [id number] Date of Birth: 1941/06/27 Referring Provider:  No ref. provider found  Encounter Date: May 18, 2016   PHYSICAL THERAPY DISCHARGE SUMMARY  Visits from Start of Care: 5  Current functional level related to goals / functional outcomes: Unknown, as patient did not return to PT after last session.      PT Long Term Goals - 04/07/16 1800      PT LONG TERM GOAL #1   Title Positional vertigo testing will be negative and asymptomatic to indicate resolved BPPV and decreased motion sensitivity.  (Target: 05/05/16)     PT LONG TERM GOAL #2   Title Pt will independently initiate vestibular HEP to maximize functional gains made in PT. (05/05/16)     PT LONG TERM GOAL #3   Title Pt will decrease DHI score from 36 to < / = 18 to indicate significant decrease in pt-perceived disability due to dizziness. (05/05/16)     PT LONG TERM GOAL #4   Title Assess dynamic balance and dynamic gait stability after vertigo clears, if indicated.  (05/05/16)       Remaining deficits: Unknown, as pt did not return to PT after last session; however, pt did call clinic to cancel remaining appts due to pt feeling better.   Education / Equipment: Education on BPPV, treatment, and habituation/gaze HEP.  Plan: Patient agrees to discharge.  Patient goals were not met. Patient is being discharged due to being pleased with the current functional level.  ?????           G-Codes - 05-18-16 1133    Functional Assessment Tool Used DHI not completed (as pt did not return to PT); however, pt cancelled appts due to no symptoms of vertigo at this time   Functional Limitation Self care   Self Care Goal Status ([id number]) At least 1 percent but less than 20 percent impaired, limited or restricted   Self Care  Discharge Status [phone]) At least 1 percent but less than 20 percent impaired, limited or restricted       Billie Ruddy, PT, DPT [facility] [address] [place], [place], [address] Phone: [phone]   Fax:  [phone] 18-May-2016, 11:35 AM

## 2016-05-13 ENCOUNTER — Encounter: Payer: Medicare Other | Admitting: Physical Therapy

## 2016-05-15 ENCOUNTER — Encounter: Payer: Medicare Other | Admitting: Physical Therapy

## 2016-07-01 ENCOUNTER — Encounter: Payer: Self-pay | Admitting: Podiatry

## 2016-07-01 ENCOUNTER — Ambulatory Visit (INDEPENDENT_AMBULATORY_CARE_PROVIDER_SITE_OTHER): Payer: Medicare Other

## 2016-07-01 ENCOUNTER — Ambulatory Visit (INDEPENDENT_AMBULATORY_CARE_PROVIDER_SITE_OTHER): Payer: Medicare Other | Admitting: Podiatry

## 2016-07-01 DIAGNOSIS — M7662 Achilles tendinitis, left leg: Secondary | ICD-10-CM | POA: Diagnosis not present

## 2016-07-01 DIAGNOSIS — M79672 Pain in left foot: Secondary | ICD-10-CM

## 2016-07-01 MED ORDER — METHYLPREDNISOLONE 4 MG PO TBPK: ORAL_TABLET | ORAL | 0 refills | Status: DC

## 2016-07-01 NOTE — Patient Instructions (Signed)

## 2016-07-02 NOTE — Progress Notes (Signed)
She presents today states that she's been having pain for the past several weeks and the posterior aspect of her left Achilles area. She denies any trauma.  Objective: Vital signs are stable she is alert and oriented 3. She no longer has any pain on palpation to the neuromas that we were previously treating. Pulses remain palpable. She has some tenderness on direct palpation at the insertion site of the Achilles on the calcaneus centrally and inferiorly. Radiographs taken today do demonstrate a small plantar distally reamed spur as well as a posterior calcaneal heel spur approximately oriented some soft tissue increase in density of the Achilles. Most likely this is associated with Achilles tendinitis.  Assessment: Achilles tendinitis. Resolution of neuromas.  Plan: Injected the posterior aspect of the left heel today placed her in a Cam Walker and prescribed methylprednisolone are Medrol Dosepak and I will follow-up with her in 1 month

## 2016-07-29 ENCOUNTER — Ambulatory Visit (INDEPENDENT_AMBULATORY_CARE_PROVIDER_SITE_OTHER): Payer: Medicare Other | Admitting: Podiatry

## 2016-07-29 ENCOUNTER — Telehealth: Payer: Self-pay | Admitting: *Deleted

## 2016-07-29 ENCOUNTER — Encounter: Payer: Self-pay | Admitting: Podiatry

## 2016-07-29 DIAGNOSIS — M7662 Achilles tendinitis, left leg: Secondary | ICD-10-CM

## 2016-07-29 NOTE — Progress Notes (Signed)
She presents today for follow-up of her Achilles tendinitis. She states there really hasn't been doing very well on been wearing the boot as prescribed as she refers to her left foot. She states there is still exquisitely sore and she cannot take a step without it hurting. It is affecting her ability to perform her daily activities.  Objective: She has pain on palpation of the posterior aspect of the Achilles tendon as it inserts on the retrocalcaneal tuberosity. Pulses remain palpable no erythema cellulitis drainage or odor.  Assessment: Retrocalcaneal heel pain with Achilles tendinitis left.  Plan: Request an MRI for surgical evaluation of the left Achilles tendon at its insertion. She will continue to wear the Cam Walker until that point.

## 2016-07-29 NOTE — Telephone Encounter (Signed)
MRI left ankle suspect left achilles tendonitis vs tear. Faxed orders to [facility] and gave orders to [facility]. [facility] for [employer].

## 2016-08-06 ENCOUNTER — Other Ambulatory Visit: Payer: Medicare Other

## 2016-08-08 ENCOUNTER — Ambulatory Visit
Admission: RE | Admit: 2016-08-08 | Discharge: 2016-08-08 | Disposition: A | Discharge status: Home / Self Care | Payer: Medicare Other | Source: Ambulatory Visit | Attending: Podiatry | Admitting: Podiatry

## 2016-08-11 NOTE — Telephone Encounter (Addendum)
-----   Message from Garrel Ridgel, [place] sent at 08/11/2016  7:28 AM EST ----- Send for over read and inform patient of the delay. Informed pt of Dr. Stephenie Acres orders and reasoning for and of the delay. Ordered copy of MRI disc from [facility]. 08/12/2016-Mailed copy of MRI disc to [facility].

## 2016-08-20 ENCOUNTER — Encounter: Payer: Self-pay | Admitting: Podiatry

## 2016-08-26 ENCOUNTER — Encounter: Payer: Self-pay | Admitting: Podiatry

## 2016-08-26 ENCOUNTER — Ambulatory Visit (INDEPENDENT_AMBULATORY_CARE_PROVIDER_SITE_OTHER): Payer: Medicare Other | Admitting: Podiatry

## 2016-08-26 DIAGNOSIS — M7662 Achilles tendinitis, left leg: Secondary | ICD-10-CM | POA: Diagnosis not present

## 2016-08-27 NOTE — Addendum Note (Signed)
Addended by: Harriett Sine D on: 08/27/2016 08:45 AM   Modules accepted: Orders

## 2016-08-27 NOTE — Progress Notes (Signed)
She presents today for evaluation of her MRI and her Achilles tendinitis.  Objective: Vital signs are stable alert and oriented 3. Pulses are palpable. She still has some tenderness on palpation of the Achilles tendon on the right foot. At this point the MRI read does demonstrate an interstitial tearing but a very low-grade interstitial tearing of the Achilles at its insertion site.  Assessment: Interstitial tearing of the Achilles tendon right.  Plan: I feel comfortable that this should not go on to tear further with physical therapy I'm going to refer her to physical therapy follow-up with her in 6 weeks.

## 2016-10-07 ENCOUNTER — Ambulatory Visit: Payer: Medicare Other | Admitting: Podiatry

## 2016-12-22 ENCOUNTER — Encounter: Payer: Self-pay | Admitting: Podiatry

## 2016-12-22 NOTE — Progress Notes (Signed)
Patient picked up requested medical records on Friday 19 December 2016.

## 2017-01-19 ENCOUNTER — Other Ambulatory Visit (HOSPITAL_COMMUNITY): Payer: Self-pay | Admitting: Internal Medicine

## 2017-01-19 DIAGNOSIS — M899 Disorder of bone, unspecified: Secondary | ICD-10-CM

## 2017-01-22 ENCOUNTER — Ambulatory Visit (HOSPITAL_COMMUNITY)
Admission: RE | Admit: 2017-01-22 | Discharge: 2017-01-22 | Disposition: A | Discharge status: Home / Self Care | Payer: Medicare Other | Source: Ambulatory Visit | Attending: Internal Medicine | Admitting: Internal Medicine

## 2017-01-22 ENCOUNTER — Encounter (HOSPITAL_COMMUNITY)
Admission: RE | Admit: 2017-01-22 | Discharge: 2017-01-22 | Disposition: A | Discharge status: Home / Self Care | Payer: Medicare Other | Source: Ambulatory Visit | Attending: Internal Medicine | Admitting: Internal Medicine

## 2017-01-22 DIAGNOSIS — Z96651 Presence of right artificial knee joint: Secondary | ICD-10-CM | POA: Insufficient documentation

## 2017-01-22 DIAGNOSIS — M899 Disorder of bone, unspecified: Secondary | ICD-10-CM | POA: Insufficient documentation

## 2017-01-22 MED ORDER — TECHNETIUM TC 99M MEDRONATE IV KIT
21.7000 | PACK | Freq: Once | INTRAVENOUS | Status: AC | PRN
  Administered 2017-01-22: 21.7 via INTRAVENOUS

## 2017-03-06 ENCOUNTER — Other Ambulatory Visit: Payer: Self-pay | Admitting: Internal Medicine

## 2017-03-06 DIAGNOSIS — Z1231 Encounter for screening mammogram for malignant neoplasm of breast: Secondary | ICD-10-CM

## 2017-03-30 ENCOUNTER — Ambulatory Visit
Admission: RE | Admit: 2017-03-30 | Discharge: 2017-03-30 | Disposition: A | Discharge status: Home / Self Care | Payer: Medicare Other | Source: Ambulatory Visit | Attending: Internal Medicine | Admitting: Internal Medicine

## 2017-03-30 DIAGNOSIS — Z1231 Encounter for screening mammogram for malignant neoplasm of breast: Secondary | ICD-10-CM

## 2017-09-03 DIAGNOSIS — M65332 Trigger finger, left middle finger: Secondary | ICD-10-CM | POA: Insufficient documentation

## 2017-09-03 DIAGNOSIS — M19049 Primary osteoarthritis, unspecified hand: Secondary | ICD-10-CM | POA: Insufficient documentation

## 2017-10-01 DIAGNOSIS — M542 Cervicalgia: Secondary | ICD-10-CM | POA: Insufficient documentation

## 2018-05-14 ENCOUNTER — Ambulatory Visit: Payer: Medicare Other | Admitting: Podiatry

## 2018-05-14 ENCOUNTER — Encounter: Payer: Self-pay | Admitting: Podiatry

## 2018-05-14 ENCOUNTER — Other Ambulatory Visit: Payer: Self-pay | Admitting: Podiatry

## 2018-05-14 ENCOUNTER — Ambulatory Visit (INDEPENDENT_AMBULATORY_CARE_PROVIDER_SITE_OTHER): Payer: Medicare Other

## 2018-05-14 DIAGNOSIS — B351 Tinea unguium: Secondary | ICD-10-CM | POA: Diagnosis not present

## 2018-05-14 DIAGNOSIS — L600 Ingrowing nail: Secondary | ICD-10-CM

## 2018-05-14 DIAGNOSIS — M79609 Pain in unspecified limb: Secondary | ICD-10-CM | POA: Diagnosis not present

## 2018-05-14 DIAGNOSIS — M79674 Pain in right toe(s): Secondary | ICD-10-CM

## 2018-05-14 DIAGNOSIS — M79676 Pain in unspecified toe(s): Secondary | ICD-10-CM | POA: Diagnosis not present

## 2018-05-14 DIAGNOSIS — S99921A Unspecified injury of right foot, initial encounter: Secondary | ICD-10-CM | POA: Diagnosis not present

## 2018-05-17 NOTE — Progress Notes (Signed)
Subjective:  Patient ID: Ebony [name], female    DOB: 11/16/1940,  MRN: [id number]  Chief Complaint  Patient presents with  . Toe Pain    right foot great toe; pt stated, "fell and hit toe about 2 days ago; toe is bruised and very painful; nail split"    77 y.o. female presents with the above complaint. Above history confirmed with patient.  Review of Systems: Negative except as noted in the HPI. Denies N/V/F/Ch.  Past Medical History:  Diagnosis Date  . Arthritis   . Cancer ([place])    BASAL CELL CA  . Depression   . GERD (gastroesophageal reflux disease)    TAKES TUMS PRN  . Heart murmur   . Hypertension   . Osteopenia   . Shortness of breath dyspnea    WITH EXERTION  . Stroke ([place])    TIA 2004- PT IS ON PLAVIX  . Tendency toward bleeding easily [facility])     Current Outpatient Medications:  .  ALPRAZolam (XANAX) 0.5 MG tablet, Take 0.5 mg by mouth at bedtime as needed for anxiety or sleep. , Disp: , Rfl: 0 .  aspirin EC 81 MG EC tablet, Take 1 tablet (81 mg total) by mouth 2 (two) times daily after a meal., Disp: 60 tablet, Rfl: 1 .  cetirizine (ZYRTEC) 10 MG tablet, Take 10 mg by mouth daily as needed for allergies., Disp: , Rfl:  .  citalopram (CELEXA) 20 MG tablet, Take 20 mg by mouth daily., Disp: , Rfl: 1 .  clopidogrel (PLAVIX) 75 MG tablet, Take 75 mg by mouth daily with breakfast., Disp: , Rfl:  .  docusate sodium (COLACE) 100 MG capsule, Take 1 capsule (100 mg total) by mouth 2 (two) times daily., Disp: 60 capsule, Rfl: 3 .  fluticasone (FLONASE) 50 MCG/ACT nasal spray, Place 2 sprays into the nose daily as needed for allergies. , Disp: , Rfl: 1 .  HYDROcodone-acetaminophen (NORCO) 5-325 MG tablet, Take 1-2 tablets by mouth every 4 (four) hours as needed for moderate pain., Disp: 90 tablet, Rfl: 0 .  Magnesium 500 MG TABS, Take 500 mg by mouth daily., Disp: , Rfl:  .  meclizine (ANTIVERT) 12.5 MG tablet, Take 12.5 mg by mouth every 6 (six) hours as needed for  dizziness. , Disp: , Rfl: 1 .  methocarbamol (ROBAXIN) 500 MG tablet, Take 1 tablet (500 mg total) by mouth every 6 (six) hours as needed for muscle spasms., Disp: 20 tablet, Rfl: 0 .  senna (SENOKOT) 8.6 MG TABS tablet, Take 2 tablets (17.2 mg total) by mouth at bedtime., Disp: 60 each, Rfl: 3 .  triamterene-hydrochlorothiazide (DYAZIDE) 37.5-25 MG capsule, Take 1 capsule by mouth daily., Disp: , Rfl: 6  Social History   Tobacco Use  Smoking Status Former Smoker  . Packs/day: 1.00  . Years: 10.00  . Pack years: 10.00  . Types: Cigarettes  . Last attempt to quit: 10/04/1971  . Years since quitting: 46.6  Smokeless Tobacco Never Used    Allergies  Allergen Reactions  . Other Anaphylaxis    Muscle Relaxer-Unsure which one  . Codeine Nausea And Vomiting  . Sulfa Antibiotics Other (See Comments)    Red streaks on nose- ? cellulitis   Objective:  There were no vitals filed for this visit. There is no height or weight on file to calculate BMI. Constitutional Well developed. Well nourished.  Vascular Dorsalis pedis pulses palpable bilaterally. Posterior tibial pulses palpable bilaterally. Capillary refill normal to all digits.  No cyanosis or clubbing noted. Pedal hair growth normal.  Neurologic Normal speech. Oriented to person, place, and time. Epicritic sensation to light touch grossly present bilaterally.  Dermatologic Nails well groomed and normal in appearance. No open wounds. R hallux nail splitting, thickening and dystrophy noted.  Orthopedic: Normal joint ROM without pain or crepitus bilaterally. No visible deformities. No bony tenderness.   Radiographs: None today Assessment:   1. Ingrown nail   2. Pain around toenail   3. Pain due to onychomycosis of nail    Plan:  Patient was evaluated and treated and all questions answered.  Ingrown Nail / Nail split -Nail gently debrided back with nail nipper. -Educated on self-care.  Return if symptoms worsen or fail  to improve.

## 2019-01-27 ENCOUNTER — Telehealth (HOSPITAL_COMMUNITY): Payer: Self-pay | Admitting: *Deleted

## 2019-01-27 NOTE — Telephone Encounter (Signed)
The above patient or their representative was contacted and gave the following answers to these questions:         Do you have any of the following symptoms? no  Fever                    Cough                   Shortness of breath  Do  you have any of the following other symptoms? no   muscle pain         vomiting,        diarrhea        rash         weakness        red eye        abdominal pain         bruising          bruising or bleeding              joint pain           severe headache    Have you been in contact with someone who was or has been sick in the past 2 weeks? no Yes                 Unsure                         Unable to assess   Does the person that you were in contact with have any of the following symptoms?   Cough         shortness of breath           muscle pain         vomiting,            diarrhea            rash            weakness           fever            red eye           abdominal pain           bruising  or  bleeding                joint pain                severe headache               Have you  or someone you have been in contact with traveled internationally in th last month?       no  If yes, which countries?   Have you  or someone you have been in contact with traveled outside [place] in th last month?         If yes, which state and city? no   COMMENTS OR ACTION PLAN FOR THIS PATIENT:          

## 2019-01-31 ENCOUNTER — Ambulatory Visit (HOSPITAL_COMMUNITY)
Admission: RE | Admit: 2019-01-31 | Discharge: 2019-01-31 | Disposition: A | Discharge status: Home / Self Care | Payer: Medicare Other | Source: Ambulatory Visit | Attending: Family | Admitting: Family

## 2019-01-31 ENCOUNTER — Other Ambulatory Visit (HOSPITAL_COMMUNITY): Payer: Self-pay | Admitting: Internal Medicine

## 2019-01-31 ENCOUNTER — Other Ambulatory Visit: Payer: Self-pay

## 2019-01-31 DIAGNOSIS — R202 Paresthesia of skin: Secondary | ICD-10-CM

## 2019-01-31 DIAGNOSIS — R209 Unspecified disturbances of skin sensation: Secondary | ICD-10-CM | POA: Insufficient documentation

## 2019-04-05 DIAGNOSIS — E785 Hyperlipidemia, unspecified: Secondary | ICD-10-CM | POA: Diagnosis present

## 2019-05-10 ENCOUNTER — Other Ambulatory Visit: Payer: Self-pay

## 2019-05-10 ENCOUNTER — Encounter: Payer: Self-pay | Admitting: Podiatry

## 2019-05-10 ENCOUNTER — Ambulatory Visit: Payer: Medicare Other | Admitting: Podiatry

## 2019-05-10 DIAGNOSIS — M79671 Pain in right foot: Secondary | ICD-10-CM | POA: Diagnosis not present

## 2019-05-10 DIAGNOSIS — G5761 Lesion of plantar nerve, right lower limb: Secondary | ICD-10-CM

## 2019-05-10 NOTE — Progress Notes (Signed)
Subjective:  Patient ID: Ebony [name], female    DOB: 1940-07-30,  MRN: [id number]  Chief Complaint  Patient presents with  . 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 Pt states [name] feel the pain returning, requests injections.    78 y.o. female presents with the above complaint.  Patient states that her right third interspace neuroma is acting up again she had last injection done last year with Dr. Milinda Pointer.  The injection has really helped low pain.  However she feels that her pain is coming back again.  She denies any other acute complaints.   Review of Systems: Negative except as noted in the HPI. Denies N/V/F/Ch.  Past Medical History:  Diagnosis Date  . Arthritis   . Cancer ([place])    BASAL CELL CA  . Depression   . GERD (gastroesophageal reflux disease)    TAKES TUMS PRN  . Heart murmur   . Hypertension   . Osteopenia   . Shortness of breath dyspnea    WITH EXERTION  . Stroke ([place])    TIA 2004- PT IS ON PLAVIX  . Tendency toward bleeding easily [facility])     Current Outpatient Medications:  .  allopurinol (ZYLOPRIM) 100 MG tablet, Take 200 mg by mouth daily., Disp: , Rfl:  .  ALPRAZolam (XANAX) 0.5 MG tablet, Take 0.5 mg by mouth at bedtime as needed for anxiety or sleep. , Disp: , Rfl: 0 .  aspirin EC 81 MG EC tablet, Take 1 tablet (81 mg total) by mouth 2 (two) times daily after a meal., Disp: 60 tablet, Rfl: 1 .  cetirizine (ZYRTEC) 10 MG tablet, Take 10 mg by mouth daily as needed for allergies., Disp: , Rfl:  .  citalopram (CELEXA) 20 MG tablet, Take 20 mg by mouth daily., Disp: , Rfl: 1 .  clopidogrel (PLAVIX) 75 MG tablet, Take 75 mg by mouth daily with breakfast., Disp: , Rfl:  .  docusate sodium (COLACE) 100 MG capsule, Take 1 capsule (100 mg total) by mouth 2 (two) times daily., Disp: 60 capsule, Rfl: 3 .  fluticasone (FLONASE) 50 MCG/ACT nasal spray, Place 2 sprays into the nose daily as needed for allergies. , Disp: , Rfl: 1 .   HYDROcodone-acetaminophen (NORCO) 5-325 MG tablet, Take 1-2 tablets by mouth every 4 (four) hours as needed for moderate pain., Disp: 90 tablet, Rfl: 0 .  Magnesium 500 MG TABS, Take 500 mg by mouth daily., Disp: , Rfl:  .  meclizine (ANTIVERT) 12.5 MG tablet, Take 12.5 mg by mouth every 6 (six) hours as needed for dizziness. , Disp: , Rfl: 1 .  methocarbamol (ROBAXIN) 500 MG tablet, Take 1 tablet (500 mg total) by mouth every 6 (six) hours as needed for muscle spasms., Disp: 20 tablet, Rfl: 0 .  morphine (MSIR) 15 MG tablet, Take 15 mg by mouth 3 (three) times daily as needed., Disp: , Rfl:  .  moxifloxacin (VIGAMOX) 0.5 % ophthalmic solution, PLACE 1 DROP INTO LEFT EYE 4 TIMES A DAY, START AFTER SURGERY, Disp: , Rfl:  .  prednisoLONE acetate (PRED FORTE) 1 % ophthalmic suspension, PLACE 1 DROP INTO OPERATIVE LEFT EYE 4 TIMES A DAY, Disp: , Rfl:  .  senna (SENOKOT) 8.6 MG TABS tablet, Take 2 tablets (17.2 mg total) by mouth at bedtime., Disp: 60 each, Rfl: 3 .  triamterene-hydrochlorothiazide (DYAZIDE) 37.5-25 MG capsule, Take 1 capsule by mouth daily., Disp: , Rfl: 6  Social History   Tobacco  Use  Smoking Status Former Smoker  . Packs/day: 1.00  . Years: 10.00  . Pack years: 10.00  . Types: Cigarettes  . Quit date: 10/04/1971  . Years since quitting: 47.6  Smokeless Tobacco Never Used    Allergies  Allergen Reactions  . Other Anaphylaxis    Muscle Relaxer-Unsure which one  . Codeine Nausea And Vomiting  . Sulfa Antibiotics Other (See Comments)    Red streaks on nose- ? cellulitis   Objective:  There were no vitals filed for this visit. There is no height or weight on file to calculate BMI. Constitutional Well developed. Well nourished.  Vascular Dorsalis pedis pulses palpable bilaterally. Posterior tibial pulses palpable bilaterally. Capillary refill normal to all digits.  No cyanosis or clubbing noted. Pedal hair growth normal.  Neurologic Normal speech. Oriented to  person, place, and time. Epicritic sensation to light touch grossly present bilaterally.  Dermatologic Nails well groomed and normal in appearance. No open wounds. No skin lesions.  Orthopedic:  Pain on palpation with positive Mulder click in the right third interspace.   Radiographs: None Assessment:   1. Neuroma, Morton's, right   2. Pain in right foot    Plan:  Patient was evaluated and treated and all questions answered.  Morton's neuroma right third interspace -This would be her fifth injection in the right third interspace.  She has been getting dehydrated alcohol injections by Dr. Milinda Pointer.  She states it really helps her. -After obtaining consent, and per orders of Dr. Boneta Lucks, injection of dehydrated alcohol given by Felipa Furnace. Patient instructed to remain in clinic for 20 minutes afterwards, and to report any adverse reaction to me immediately. -Explained to her the etiology and all the treatment options available for treating Morton's neuroma.  She stated that she would like to continue undergoing the dehydrated alcohol therapy and nonsurgical interventions.  No follow-ups on file.

## 2019-09-16 ENCOUNTER — Ambulatory Visit: Payer: Medicare Other | Attending: Internal Medicine

## 2019-09-16 DIAGNOSIS — Z23 Encounter for immunization: Secondary | ICD-10-CM | POA: Insufficient documentation

## 2019-09-16 NOTE — Progress Notes (Signed)
   Covid-19 Vaccination Clinic  Name:  Ebony [name]    MRN: [id number] DOB: 04-06-1941  09/16/2019  [name] was observed post Covid-19 immunization for 15 minutes without incidence. She was provided with Vaccine Information Sheet and instruction to access the V-Safe system.   [name] was instructed to call 911 with any severe reactions post vaccine: [name] Difficulty breathing  . Swelling of your face and throat  . A fast heartbeat  . A bad rash all over your body  . Dizziness and weakness    Immunizations Administered    Name Date Dose VIS Date Route   Pfizer COVID-19 Vaccine 09/16/2019 10:08 AM 0.3 mL 07/01/2019 Intramuscular   Manufacturer: [place]   Lot: [id number]   [place]: [id number]

## 2019-10-11 ENCOUNTER — Ambulatory Visit: Payer: Medicare PPO | Attending: Internal Medicine

## 2019-10-11 DIAGNOSIS — Z23 Encounter for immunization: Secondary | ICD-10-CM

## 2019-10-11 NOTE — Progress Notes (Signed)
   Covid-19 Vaccination Clinic  Name:  Ebony [name]    MRN: [id number] DOB: Mar 28, 1941  10/11/2019  Ebony [name] was observed post Covid-19 immunization for 15 minutes without incident. She was provided with Vaccine Information Sheet and instruction to access the V-Safe system.   Ebony [name] was instructed to call 911 with any severe reactions post vaccine: [name] Difficulty breathing  . Swelling of face and throat  . A fast heartbeat  . A bad rash all over body  . Dizziness and weakness   Immunizations Administered    Name Date Dose VIS Date Route   Pfizer COVID-19 Vaccine 10/11/2019 12:05 PM 0.3 mL 07/01/2019 Intramuscular   Manufacturer: [place]   Lot: [id number]   [place]: [id number]

## 2019-12-08 ENCOUNTER — Ambulatory Visit (INDEPENDENT_AMBULATORY_CARE_PROVIDER_SITE_OTHER): Payer: Medicare PPO

## 2019-12-08 ENCOUNTER — Ambulatory Visit: Payer: Medicare PPO | Admitting: Podiatry

## 2019-12-08 ENCOUNTER — Encounter: Payer: Self-pay | Admitting: Podiatry

## 2019-12-08 ENCOUNTER — Other Ambulatory Visit: Payer: Self-pay

## 2019-12-08 DIAGNOSIS — S9031XA Contusion of right foot, initial encounter: Secondary | ICD-10-CM | POA: Diagnosis not present

## 2019-12-08 DIAGNOSIS — S8264XA Nondisplaced fracture of lateral malleolus of right fibula, initial encounter for closed fracture: Secondary | ICD-10-CM

## 2019-12-08 NOTE — Progress Notes (Signed)
She presents today with pain to the lateral foot and lateral ankle.  She states that she was walking 1 week ago in [place] never fell never twisted her foot but all of a sudden she cannot step over top of her foot she could not walk heel-to-toe.  She states the majority of the pain is located over the lateral malleolus as she points to the area.  She states that it was bruised and swollen bruising is gone away but the swelling is still present and the foot is still painful.  Though it has improved it is not well and she states that it still hurts enough that she wanted to have it looked at.  ROS: Denies fever chills nausea vomiting muscle aches pains calf pain back pain chest pain shortness of breath.  Just had Mohs surgery on her right nostril with considerable bruising around her eye.  Objective: Vital signs are stable she is alert oriented x3 ambulates into the exam room with an antalgic gait to the right lower extremity.  Right lower extremity is painful and puffy along the lateral ankle and lateral foot.  Pulses are strong palpable neurologic sensorium is intact degenerative flexors are intact muscle strength is normal and symmetrical.  Orthopedic evaluation demonstrates pain on palpation lateral malleolus some tenderness on palpation of the fifth metatarsal base cuboid and calcaneus no plantar fascial pain no medial pain at all.  She has no calf pain.  Cutaneous evaluation does not demonstrate any type of open lesions or wounds.  Considerable swelling to the lateral ankle.  Radiographs demonstrate swelling of the soft tissue of the right lower extremity particularly laterally.  It does demonstrate a small transverse fracture just above the lateral malleolus with some periostitis.  This is nondislocated nondisplaced noncomminuted.  I see no other fractures to the metatarsals or tarsals.  She does retain a K wire to the first metatarsal from her previous bunion surgery.  Assessment: Fracture lateral  malleolus contusion foot.  Plan: Placed her in a compression anklet today discussed icing therapy also placed her in a cam walker.  I will follow-up with her in 1 month

## 2020-01-10 ENCOUNTER — Ambulatory Visit: Payer: Medicare PPO | Admitting: Podiatry

## 2020-01-30 DIAGNOSIS — D1801 Hemangioma of skin and subcutaneous tissue: Secondary | ICD-10-CM | POA: Diagnosis not present

## 2020-01-30 DIAGNOSIS — D224 Melanocytic nevi of scalp and neck: Secondary | ICD-10-CM | POA: Diagnosis not present

## 2020-01-30 DIAGNOSIS — D2271 Melanocytic nevi of right lower limb, including hip: Secondary | ICD-10-CM | POA: Diagnosis not present

## 2020-01-30 DIAGNOSIS — L821 Other seborrheic keratosis: Secondary | ICD-10-CM | POA: Diagnosis not present

## 2020-01-30 DIAGNOSIS — Z85828 Personal history of other malignant neoplasm of skin: Secondary | ICD-10-CM | POA: Diagnosis not present

## 2020-01-30 DIAGNOSIS — L570 Actinic keratosis: Secondary | ICD-10-CM | POA: Diagnosis not present

## 2020-01-30 DIAGNOSIS — L218 Other seborrheic dermatitis: Secondary | ICD-10-CM | POA: Diagnosis not present

## 2020-03-01 DIAGNOSIS — M5386 Other specified dorsopathies, lumbar region: Secondary | ICD-10-CM | POA: Diagnosis not present

## 2020-03-01 DIAGNOSIS — M150 Primary generalized (osteo)arthritis: Secondary | ICD-10-CM | POA: Diagnosis not present

## 2020-03-01 DIAGNOSIS — E663 Overweight: Secondary | ICD-10-CM | POA: Diagnosis not present

## 2020-03-01 DIAGNOSIS — Z8261 Family history of arthritis: Secondary | ICD-10-CM | POA: Diagnosis not present

## 2020-03-01 DIAGNOSIS — M109 Gout, unspecified: Secondary | ICD-10-CM | POA: Diagnosis not present

## 2020-03-01 DIAGNOSIS — M2550 Pain in unspecified joint: Secondary | ICD-10-CM | POA: Diagnosis not present

## 2020-03-01 DIAGNOSIS — Z6829 Body mass index (BMI) 29.0-29.9, adult: Secondary | ICD-10-CM | POA: Diagnosis not present

## 2020-03-06 DIAGNOSIS — H938X2 Other specified disorders of left ear: Secondary | ICD-10-CM | POA: Diagnosis not present

## 2020-03-06 DIAGNOSIS — H6123 Impacted cerumen, bilateral: Secondary | ICD-10-CM | POA: Diagnosis not present

## 2020-03-17 DIAGNOSIS — Z96649 Presence of unspecified artificial hip joint: Secondary | ICD-10-CM | POA: Diagnosis not present

## 2020-03-17 DIAGNOSIS — Z85828 Personal history of other malignant neoplasm of skin: Secondary | ICD-10-CM | POA: Diagnosis not present

## 2020-03-17 DIAGNOSIS — Z9181 History of falling: Secondary | ICD-10-CM | POA: Diagnosis not present

## 2020-03-17 DIAGNOSIS — Z87891 Personal history of nicotine dependence: Secondary | ICD-10-CM | POA: Diagnosis not present

## 2020-03-17 DIAGNOSIS — I739 Peripheral vascular disease, unspecified: Secondary | ICD-10-CM | POA: Diagnosis not present

## 2020-03-17 DIAGNOSIS — E785 Hyperlipidemia, unspecified: Secondary | ICD-10-CM | POA: Diagnosis not present

## 2020-03-17 DIAGNOSIS — K5909 Other constipation: Secondary | ICD-10-CM | POA: Diagnosis not present

## 2020-03-17 DIAGNOSIS — Z833 Family history of diabetes mellitus: Secondary | ICD-10-CM | POA: Diagnosis not present

## 2020-03-17 DIAGNOSIS — I10 Essential (primary) hypertension: Secondary | ICD-10-CM | POA: Diagnosis not present

## 2020-03-17 DIAGNOSIS — Z8673 Personal history of transient ischemic attack (TIA), and cerebral infarction without residual deficits: Secondary | ICD-10-CM | POA: Diagnosis not present

## 2020-03-17 DIAGNOSIS — M1711 Unilateral primary osteoarthritis, right knee: Secondary | ICD-10-CM | POA: Diagnosis not present

## 2020-03-17 DIAGNOSIS — Z7902 Long term (current) use of antithrombotics/antiplatelets: Secondary | ICD-10-CM | POA: Diagnosis not present

## 2020-03-17 DIAGNOSIS — Z803 Family history of malignant neoplasm of breast: Secondary | ICD-10-CM | POA: Diagnosis not present

## 2020-03-17 DIAGNOSIS — F325 Major depressive disorder, single episode, in full remission: Secondary | ICD-10-CM | POA: Diagnosis not present

## 2020-03-17 DIAGNOSIS — M109 Gout, unspecified: Secondary | ICD-10-CM | POA: Diagnosis not present

## 2020-04-09 DIAGNOSIS — E785 Hyperlipidemia, unspecified: Secondary | ICD-10-CM | POA: Diagnosis not present

## 2020-04-09 DIAGNOSIS — I10 Essential (primary) hypertension: Secondary | ICD-10-CM | POA: Diagnosis not present

## 2020-04-09 DIAGNOSIS — M109 Gout, unspecified: Secondary | ICD-10-CM | POA: Diagnosis not present

## 2020-04-09 DIAGNOSIS — E559 Vitamin D deficiency, unspecified: Secondary | ICD-10-CM | POA: Diagnosis not present

## 2020-04-16 DIAGNOSIS — I10 Essential (primary) hypertension: Secondary | ICD-10-CM | POA: Diagnosis not present

## 2020-04-16 DIAGNOSIS — R82998 Other abnormal findings in urine: Secondary | ICD-10-CM | POA: Diagnosis not present

## 2020-04-16 DIAGNOSIS — E785 Hyperlipidemia, unspecified: Secondary | ICD-10-CM | POA: Diagnosis not present

## 2020-04-16 DIAGNOSIS — G609 Hereditary and idiopathic neuropathy, unspecified: Secondary | ICD-10-CM | POA: Diagnosis not present

## 2020-04-16 DIAGNOSIS — Z23 Encounter for immunization: Secondary | ICD-10-CM | POA: Diagnosis not present

## 2020-04-16 DIAGNOSIS — N1831 Chronic kidney disease, stage 3a: Secondary | ICD-10-CM | POA: Diagnosis not present

## 2020-04-16 DIAGNOSIS — E559 Vitamin D deficiency, unspecified: Secondary | ICD-10-CM | POA: Diagnosis not present

## 2020-04-16 DIAGNOSIS — Z0000 Encounter for general adult medical examination without abnormal findings: Secondary | ICD-10-CM | POA: Diagnosis not present

## 2020-04-16 DIAGNOSIS — F419 Anxiety disorder, unspecified: Secondary | ICD-10-CM | POA: Diagnosis not present

## 2020-04-16 DIAGNOSIS — I6523 Occlusion and stenosis of bilateral carotid arteries: Secondary | ICD-10-CM | POA: Diagnosis not present

## 2020-04-17 DIAGNOSIS — Z1212 Encounter for screening for malignant neoplasm of rectum: Secondary | ICD-10-CM | POA: Diagnosis not present

## 2020-04-20 ENCOUNTER — Other Ambulatory Visit (HOSPITAL_COMMUNITY): Payer: Self-pay | Admitting: Internal Medicine

## 2020-04-20 DIAGNOSIS — I6523 Occlusion and stenosis of bilateral carotid arteries: Secondary | ICD-10-CM

## 2020-04-23 ENCOUNTER — Ambulatory Visit (HOSPITAL_COMMUNITY)
Admission: RE | Admit: 2020-04-23 | Discharge: 2020-04-23 | Disposition: A | Discharge status: Home / Self Care | Payer: Medicare PPO | Source: Ambulatory Visit | Attending: Surgery | Admitting: Surgery

## 2020-04-23 ENCOUNTER — Other Ambulatory Visit: Payer: Self-pay

## 2020-04-23 DIAGNOSIS — I6523 Occlusion and stenosis of bilateral carotid arteries: Secondary | ICD-10-CM | POA: Diagnosis not present

## 2020-07-19 DIAGNOSIS — I6523 Occlusion and stenosis of bilateral carotid arteries: Secondary | ICD-10-CM | POA: Diagnosis not present

## 2020-07-19 DIAGNOSIS — I129 Hypertensive chronic kidney disease with stage 1 through stage 4 chronic kidney disease, or unspecified chronic kidney disease: Secondary | ICD-10-CM | POA: Diagnosis not present

## 2020-07-19 DIAGNOSIS — N1831 Chronic kidney disease, stage 3a: Secondary | ICD-10-CM | POA: Diagnosis not present

## 2020-07-19 DIAGNOSIS — G459 Transient cerebral ischemic attack, unspecified: Secondary | ICD-10-CM | POA: Diagnosis not present

## 2020-07-19 DIAGNOSIS — G720 Drug-induced myopathy: Secondary | ICD-10-CM | POA: Diagnosis not present

## 2020-07-19 DIAGNOSIS — I6529 Occlusion and stenosis of unspecified carotid artery: Secondary | ICD-10-CM | POA: Diagnosis not present

## 2020-07-19 DIAGNOSIS — E785 Hyperlipidemia, unspecified: Secondary | ICD-10-CM | POA: Diagnosis not present

## 2020-07-23 DIAGNOSIS — Z20822 Contact with and (suspected) exposure to covid-19: Secondary | ICD-10-CM | POA: Diagnosis not present

## 2020-08-02 DIAGNOSIS — Z20822 Contact with and (suspected) exposure to covid-19: Secondary | ICD-10-CM | POA: Diagnosis not present

## 2020-08-28 DIAGNOSIS — L821 Other seborrheic keratosis: Secondary | ICD-10-CM | POA: Diagnosis not present

## 2020-08-28 DIAGNOSIS — D2261 Melanocytic nevi of right upper limb, including shoulder: Secondary | ICD-10-CM | POA: Diagnosis not present

## 2020-08-28 DIAGNOSIS — C44619 Basal cell carcinoma of skin of left upper limb, including shoulder: Secondary | ICD-10-CM | POA: Diagnosis not present

## 2020-08-28 DIAGNOSIS — D692 Other nonthrombocytopenic purpura: Secondary | ICD-10-CM | POA: Diagnosis not present

## 2020-08-28 DIAGNOSIS — D485 Neoplasm of uncertain behavior of skin: Secondary | ICD-10-CM | POA: Diagnosis not present

## 2020-08-28 DIAGNOSIS — L570 Actinic keratosis: Secondary | ICD-10-CM | POA: Diagnosis not present

## 2020-08-28 DIAGNOSIS — Z85828 Personal history of other malignant neoplasm of skin: Secondary | ICD-10-CM | POA: Diagnosis not present

## 2020-08-28 DIAGNOSIS — D1801 Hemangioma of skin and subcutaneous tissue: Secondary | ICD-10-CM | POA: Diagnosis not present

## 2020-08-28 DIAGNOSIS — C44719 Basal cell carcinoma of skin of left lower limb, including hip: Secondary | ICD-10-CM | POA: Diagnosis not present

## 2020-08-28 DIAGNOSIS — D2262 Melanocytic nevi of left upper limb, including shoulder: Secondary | ICD-10-CM | POA: Diagnosis not present

## 2020-10-10 DIAGNOSIS — M25562 Pain in left knee: Secondary | ICD-10-CM | POA: Diagnosis not present

## 2020-11-06 DIAGNOSIS — H04123 Dry eye syndrome of bilateral lacrimal glands: Secondary | ICD-10-CM | POA: Diagnosis not present

## 2020-11-06 DIAGNOSIS — Z961 Presence of intraocular lens: Secondary | ICD-10-CM | POA: Diagnosis not present

## 2020-11-06 DIAGNOSIS — H524 Presbyopia: Secondary | ICD-10-CM | POA: Diagnosis not present

## 2020-12-27 DIAGNOSIS — M25562 Pain in left knee: Secondary | ICD-10-CM | POA: Diagnosis not present

## 2021-01-28 DIAGNOSIS — R29818 Other symptoms and signs involving the nervous system: Secondary | ICD-10-CM | POA: Diagnosis not present

## 2021-01-28 DIAGNOSIS — R519 Headache, unspecified: Secondary | ICD-10-CM | POA: Diagnosis not present

## 2021-01-28 DIAGNOSIS — Z5321 Procedure and treatment not carried out due to patient leaving prior to being seen by health care provider: Secondary | ICD-10-CM | POA: Insufficient documentation

## 2021-01-28 DIAGNOSIS — I6782 Cerebral ischemia: Secondary | ICD-10-CM | POA: Diagnosis not present

## 2021-01-28 DIAGNOSIS — Z8673 Personal history of transient ischemic attack (TIA), and cerebral infarction without residual deficits: Secondary | ICD-10-CM | POA: Insufficient documentation

## 2021-01-28 DIAGNOSIS — Z7902 Long term (current) use of antithrombotics/antiplatelets: Secondary | ICD-10-CM | POA: Insufficient documentation

## 2021-01-28 DIAGNOSIS — G319 Degenerative disease of nervous system, unspecified: Secondary | ICD-10-CM | POA: Diagnosis not present

## 2021-01-29 ENCOUNTER — Emergency Department (HOSPITAL_COMMUNITY)
Admission: EM | Admit: 2021-01-29 | Discharge: 2021-01-29 | Disposition: A | Discharge status: Home / Self Care | Payer: Medicare PPO | Attending: Emergency Medicine | Admitting: Emergency Medicine

## 2021-01-29 ENCOUNTER — Emergency Department (HOSPITAL_COMMUNITY): Payer: Medicare PPO

## 2021-01-29 DIAGNOSIS — I6782 Cerebral ischemia: Secondary | ICD-10-CM | POA: Diagnosis not present

## 2021-01-29 DIAGNOSIS — G319 Degenerative disease of nervous system, unspecified: Secondary | ICD-10-CM | POA: Diagnosis not present

## 2021-01-29 DIAGNOSIS — R29818 Other symptoms and signs involving the nervous system: Secondary | ICD-10-CM | POA: Diagnosis not present

## 2021-01-29 LAB — DIFFERENTIAL
Abs Immature Granulocytes: 0.06 10*3/uL (ref 0.00–0.07)
Basophils Absolute: 0 10*3/uL (ref 0.0–0.1)
Basophils Relative: 0 %
Eosinophils Absolute: 0.1 10*3/uL (ref 0.0–0.5)
Eosinophils Relative: 1 %
Immature Granulocytes: 1 %
Lymphocytes Relative: 20 %
Lymphs Abs: 1.6 10*3/uL (ref 0.7–4.0)
Monocytes Absolute: 0.7 10*3/uL (ref 0.1–1.0)
Monocytes Relative: 9 %
Neutro Abs: 5.6 10*3/uL (ref 1.7–7.7)
Neutrophils Relative %: 69 %

## 2021-01-29 LAB — URINALYSIS, ROUTINE W REFLEX MICROSCOPIC
Bacteria, UA: NONE SEEN
Bilirubin Urine: NEGATIVE
Glucose, UA: NEGATIVE mg/dL
Hgb urine dipstick: NEGATIVE
Ketones, ur: NEGATIVE mg/dL
Nitrite: NEGATIVE
Protein, ur: NEGATIVE mg/dL
Specific Gravity, Urine: 1.018 (ref 1.005–1.030)
pH: 5 (ref 5.0–8.0)

## 2021-01-29 LAB — COMPREHENSIVE METABOLIC PANEL
ALT: 23 U/L (ref 0–44)
AST: 30 U/L (ref 15–41)
Albumin: 3.5 g/dL (ref 3.5–5.0)
Alkaline Phosphatase: 52 U/L (ref 38–126)
Anion gap: 12 (ref 5–15)
BUN: 33 mg/dL — ABNORMAL HIGH (ref 8–23)
CO2: 24 mmol/L (ref 22–32)
Calcium: 9.6 mg/dL (ref 8.9–10.3)
Chloride: 101 mmol/L (ref 98–111)
Creatinine, Ser: 1.1 mg/dL — ABNORMAL HIGH (ref 0.44–1.00)
GFR, Estimated: 51 mL/min — ABNORMAL LOW (ref >60)
Glucose, Bld: 159 mg/dL — ABNORMAL HIGH (ref 70–99)
Potassium: 3.1 mmol/L — ABNORMAL LOW (ref 3.5–5.1)
Sodium: 137 mmol/L (ref 135–145)
Total Bilirubin: 0.5 mg/dL (ref 0.3–1.2)
Total Protein: 6.3 g/dL — ABNORMAL LOW (ref 6.5–8.1)

## 2021-01-29 LAB — CBC
HCT: 43.3 % (ref 36.0–46.0)
Hemoglobin: 14.9 g/dL (ref 12.0–15.0)
MCH: 34.7 pg — ABNORMAL HIGH (ref 26.0–34.0)
MCHC: 34.4 g/dL (ref 30.0–36.0)
MCV: 100.7 fL — ABNORMAL HIGH (ref 80.0–100.0)
Platelets: 307 10*3/uL (ref 150–400)
RBC: 4.3 MIL/uL (ref 3.87–5.11)
RDW: 12.5 % (ref 11.5–15.5)
WBC: 8 10*3/uL (ref 4.0–10.5)
nRBC: 0 % (ref 0.0–0.2)

## 2021-01-29 LAB — PROTIME-INR
INR: 0.9 (ref 0.8–1.2)
Prothrombin Time: 12.2 seconds (ref 11.4–15.2)

## 2021-01-29 LAB — APTT: aPTT: 28 seconds (ref 24–36)

## 2021-01-29 NOTE — ED Provider Notes (Signed)
Emergency Medicine Provider Triage Evaluation Note  Ebony [name] , a 80 y.o. female  was evaluated in triage.  Pt complains of global head "fullness" which began around lunch time. Has had associated balance issues. Reports veering to the right with ambulation. RLE would subsequently "give out" and patient would fall. Denies head trauma or LOC. On plavix. Hx of TIA.  Review of Systems  Positive: Headache, gait difficulty Negative: N/V, chest pain, syncope  Physical Exam  BP (!) 153/88   Pulse 88   Temp 98.2 F (36.8 C)   Resp 16   SpO2 97%  Gen:   Awake, no distress   Resp:  Normal effort  MSK:   Moves extremities without difficulty  Other:  Gait steady with minimal assistance. No focal deficits noted.  Medical Decision Making  Medically screening exam initiated at 1:05 AM.  Appropriate orders placed.  Ebony [name] was informed that the remainder of the evaluation will be completed by another provider, this initial triage assessment does not replace that evaluation, and the importance of remaining in the ED until their evaluation is complete.  Headache, gait difficulty   Antonietta Breach, Hershal Coria 01/29/21 0107    Merryl Hacker, MD 01/29/21 [phone]

## 2021-01-29 NOTE — ED Notes (Signed)
Pt states the wait is too long and she is leaving

## 2021-01-29 NOTE — ED Triage Notes (Signed)
Pt states around lunchtime, began veering off to R while walking, R leg would give out & she would fall.  Hx TIA, on Plavix. Endorses fullness/pressure in head. Pt has hx vertigo, states it feels different.

## 2021-02-04 ENCOUNTER — Other Ambulatory Visit: Payer: Self-pay | Admitting: Registered Nurse

## 2021-02-04 DIAGNOSIS — E785 Hyperlipidemia, unspecified: Secondary | ICD-10-CM | POA: Diagnosis not present

## 2021-02-04 DIAGNOSIS — I6523 Occlusion and stenosis of bilateral carotid arteries: Secondary | ICD-10-CM | POA: Diagnosis not present

## 2021-02-04 DIAGNOSIS — R6889 Other general symptoms and signs: Secondary | ICD-10-CM

## 2021-02-04 DIAGNOSIS — I129 Hypertensive chronic kidney disease with stage 1 through stage 4 chronic kidney disease, or unspecified chronic kidney disease: Secondary | ICD-10-CM | POA: Diagnosis not present

## 2021-02-04 DIAGNOSIS — I6529 Occlusion and stenosis of unspecified carotid artery: Secondary | ICD-10-CM | POA: Diagnosis not present

## 2021-02-04 DIAGNOSIS — G459 Transient cerebral ischemic attack, unspecified: Secondary | ICD-10-CM | POA: Diagnosis not present

## 2021-02-04 DIAGNOSIS — R2681 Unsteadiness on feet: Secondary | ICD-10-CM

## 2021-02-04 DIAGNOSIS — N1831 Chronic kidney disease, stage 3a: Secondary | ICD-10-CM | POA: Diagnosis not present

## 2021-02-11 ENCOUNTER — Ambulatory Visit
Admission: RE | Admit: 2021-02-11 | Discharge: 2021-02-11 | Disposition: A | Discharge status: Home / Self Care | Payer: Medicare PPO | Source: Ambulatory Visit | Attending: Registered Nurse | Admitting: Registered Nurse

## 2021-02-11 ENCOUNTER — Other Ambulatory Visit: Payer: Self-pay

## 2021-02-11 DIAGNOSIS — R2681 Unsteadiness on feet: Secondary | ICD-10-CM

## 2021-02-11 DIAGNOSIS — I6782 Cerebral ischemia: Secondary | ICD-10-CM | POA: Diagnosis not present

## 2021-02-11 DIAGNOSIS — R2689 Other abnormalities of gait and mobility: Secondary | ICD-10-CM | POA: Diagnosis not present

## 2021-02-11 DIAGNOSIS — R6889 Other general symptoms and signs: Secondary | ICD-10-CM

## 2021-02-11 MED ORDER — GADOBENATE DIMEGLUMINE 529 MG/ML IV SOLN
15.0000 mL | Freq: Once | INTRAVENOUS | Status: AC | PRN
  Administered 2021-02-11: 15 mL via INTRAVENOUS

## 2021-03-12 DIAGNOSIS — D1801 Hemangioma of skin and subcutaneous tissue: Secondary | ICD-10-CM | POA: Diagnosis not present

## 2021-03-12 DIAGNOSIS — L905 Scar conditions and fibrosis of skin: Secondary | ICD-10-CM | POA: Diagnosis not present

## 2021-03-12 DIAGNOSIS — L821 Other seborrheic keratosis: Secondary | ICD-10-CM | POA: Diagnosis not present

## 2021-03-12 DIAGNOSIS — L570 Actinic keratosis: Secondary | ICD-10-CM | POA: Diagnosis not present

## 2021-03-12 DIAGNOSIS — Z85828 Personal history of other malignant neoplasm of skin: Secondary | ICD-10-CM | POA: Diagnosis not present

## 2021-03-12 DIAGNOSIS — D2271 Melanocytic nevi of right lower limb, including hip: Secondary | ICD-10-CM | POA: Diagnosis not present

## 2021-03-12 DIAGNOSIS — D485 Neoplasm of uncertain behavior of skin: Secondary | ICD-10-CM | POA: Diagnosis not present

## 2021-03-12 DIAGNOSIS — L820 Inflamed seborrheic keratosis: Secondary | ICD-10-CM | POA: Diagnosis not present

## 2021-03-12 DIAGNOSIS — D692 Other nonthrombocytopenic purpura: Secondary | ICD-10-CM | POA: Diagnosis not present

## 2021-03-12 DIAGNOSIS — L859 Epidermal thickening, unspecified: Secondary | ICD-10-CM | POA: Diagnosis not present

## 2021-04-02 DIAGNOSIS — M1990 Unspecified osteoarthritis, unspecified site: Secondary | ICD-10-CM | POA: Diagnosis not present

## 2021-04-02 DIAGNOSIS — I739 Peripheral vascular disease, unspecified: Secondary | ICD-10-CM | POA: Diagnosis not present

## 2021-04-02 DIAGNOSIS — F324 Major depressive disorder, single episode, in partial remission: Secondary | ICD-10-CM | POA: Diagnosis not present

## 2021-04-02 DIAGNOSIS — I10 Essential (primary) hypertension: Secondary | ICD-10-CM | POA: Diagnosis not present

## 2021-04-02 DIAGNOSIS — Z6830 Body mass index (BMI) 30.0-30.9, adult: Secondary | ICD-10-CM | POA: Diagnosis not present

## 2021-04-02 DIAGNOSIS — Z7902 Long term (current) use of antithrombotics/antiplatelets: Secondary | ICD-10-CM | POA: Diagnosis not present

## 2021-04-02 DIAGNOSIS — M109 Gout, unspecified: Secondary | ICD-10-CM | POA: Diagnosis not present

## 2021-04-02 DIAGNOSIS — E669 Obesity, unspecified: Secondary | ICD-10-CM | POA: Diagnosis not present

## 2021-04-02 DIAGNOSIS — E785 Hyperlipidemia, unspecified: Secondary | ICD-10-CM | POA: Diagnosis not present

## 2021-04-22 DIAGNOSIS — E785 Hyperlipidemia, unspecified: Secondary | ICD-10-CM | POA: Diagnosis not present

## 2021-04-22 DIAGNOSIS — E559 Vitamin D deficiency, unspecified: Secondary | ICD-10-CM | POA: Diagnosis not present

## 2021-04-29 DIAGNOSIS — Z23 Encounter for immunization: Secondary | ICD-10-CM | POA: Diagnosis not present

## 2021-04-29 DIAGNOSIS — E785 Hyperlipidemia, unspecified: Secondary | ICD-10-CM | POA: Diagnosis not present

## 2021-04-29 DIAGNOSIS — I10 Essential (primary) hypertension: Secondary | ICD-10-CM | POA: Diagnosis not present

## 2021-04-29 DIAGNOSIS — I6529 Occlusion and stenosis of unspecified carotid artery: Secondary | ICD-10-CM | POA: Diagnosis not present

## 2021-04-29 DIAGNOSIS — Z1331 Encounter for screening for depression: Secondary | ICD-10-CM | POA: Diagnosis not present

## 2021-04-29 DIAGNOSIS — G459 Transient cerebral ischemic attack, unspecified: Secondary | ICD-10-CM | POA: Diagnosis not present

## 2021-04-29 DIAGNOSIS — N1831 Chronic kidney disease, stage 3a: Secondary | ICD-10-CM | POA: Diagnosis not present

## 2021-04-29 DIAGNOSIS — E559 Vitamin D deficiency, unspecified: Secondary | ICD-10-CM | POA: Diagnosis not present

## 2021-04-29 DIAGNOSIS — Z1389 Encounter for screening for other disorder: Secondary | ICD-10-CM | POA: Diagnosis not present

## 2021-04-29 DIAGNOSIS — Z0000 Encounter for general adult medical examination without abnormal findings: Secondary | ICD-10-CM | POA: Diagnosis not present

## 2021-04-29 DIAGNOSIS — N3281 Overactive bladder: Secondary | ICD-10-CM | POA: Diagnosis not present

## 2021-04-29 DIAGNOSIS — I6523 Occlusion and stenosis of bilateral carotid arteries: Secondary | ICD-10-CM | POA: Diagnosis not present

## 2021-05-16 ENCOUNTER — Ambulatory Visit: Payer: Medicare PPO | Admitting: Podiatry

## 2021-05-16 ENCOUNTER — Encounter: Payer: Self-pay | Admitting: Podiatry

## 2021-05-16 ENCOUNTER — Other Ambulatory Visit: Payer: Self-pay

## 2021-05-16 DIAGNOSIS — L603 Nail dystrophy: Secondary | ICD-10-CM | POA: Diagnosis not present

## 2021-05-16 DIAGNOSIS — G5793 Unspecified mononeuropathy of bilateral lower limbs: Secondary | ICD-10-CM

## 2021-05-16 NOTE — Progress Notes (Signed)
She presents today after having not seen her since May of last year she complaining of thick bilateral toenails she is also complaining of some numbness in the bilateral foot and tingling.  She is also complaining of ankles itching and cold at nighttime.  No changes in her past medical history medications allergies.  Objective: Vitals are stable alert and oriented x3.  Pulses are palpable.  Neurologic sensorium is intact.  Toenails are thick particular the hallux nails sharply incurvated with subungual debris.  Assessment: Nail dystrophy hallux bilateral neuropathy bilateral.  Plan: We will follow-up with her in 1 month for results of the nail pathology.  Also may need to consider gabapentin or Lyrica for the neuropathy.

## 2021-06-05 DIAGNOSIS — M5416 Radiculopathy, lumbar region: Secondary | ICD-10-CM | POA: Diagnosis not present

## 2021-06-17 ENCOUNTER — Other Ambulatory Visit: Payer: Self-pay | Admitting: Chiropractic Medicine

## 2021-06-17 DIAGNOSIS — M545 Low back pain, unspecified: Secondary | ICD-10-CM

## 2021-06-17 DIAGNOSIS — M25552 Pain in left hip: Secondary | ICD-10-CM

## 2021-06-20 ENCOUNTER — Ambulatory Visit: Payer: Medicare PPO | Admitting: Podiatry

## 2021-06-20 ENCOUNTER — Encounter: Payer: Self-pay | Admitting: Podiatry

## 2021-06-20 ENCOUNTER — Other Ambulatory Visit: Payer: Self-pay

## 2021-06-20 DIAGNOSIS — B351 Tinea unguium: Secondary | ICD-10-CM | POA: Diagnosis not present

## 2021-06-20 DIAGNOSIS — Z79899 Other long term (current) drug therapy: Secondary | ICD-10-CM | POA: Diagnosis not present

## 2021-06-20 DIAGNOSIS — M79676 Pain in unspecified toe(s): Secondary | ICD-10-CM | POA: Diagnosis not present

## 2021-06-20 DIAGNOSIS — L603 Nail dystrophy: Secondary | ICD-10-CM

## 2021-06-20 LAB — COMPREHENSIVE METABOLIC PANEL
AG Ratio: 1.6 (calc) (ref 1.0–2.5)
ALT: 11 U/L (ref 6–29)
AST: 16 U/L (ref 10–35)
Albumin: 4.1 g/dL (ref 3.6–5.1)
Alkaline phosphatase (APISO): 53 U/L (ref 37–153)
BUN: 21 mg/dL (ref 7–25)
CO2: 31 mmol/L (ref 20–32)
Calcium: 9.9 mg/dL (ref 8.6–10.4)
Chloride: 103 mmol/L (ref 98–110)
Creat: 0.97 mg/dL (ref 0.60–1.00)
Globulin: 2.6 g/dL (calc) (ref 1.9–3.7)
Glucose, Bld: 131 mg/dL (ref 65–139)
Potassium: 4.4 mmol/L (ref 3.5–5.3)
Sodium: 146 mmol/L (ref 135–146)
Total Bilirubin: 0.4 mg/dL (ref 0.2–1.2)
Total Protein: 6.7 g/dL (ref 6.1–8.1)

## 2021-06-20 MED ORDER — TERBINAFINE HCL 250 MG PO TABS: 250.0000 mg | ORAL_TABLET | Freq: Every day | ORAL | 0 refills | Status: DC

## 2021-06-20 NOTE — Progress Notes (Signed)
She presents today with her grandson for follow-up of her nail pathology.  She states that her nails are long and painful and would like to have them cut.  Objective: Vital signs are stable she is alert and oriented x3.  Toenails are long thick yellow dystrophic clinically mycotic.  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  Assessment: Saprophytic fungus with painful elongated nails.  Plan: Debridement of toenails 1 through 5 bilateral.  Wrote a prescription requisition for CMP and started her on Lamisil 250 mg tablets 1 p.o. daily.  She will follow-up with me in 1 month should her blood work come back abnormal I will notify her immediately.

## 2021-06-21 DIAGNOSIS — M9903 Segmental and somatic dysfunction of lumbar region: Secondary | ICD-10-CM | POA: Diagnosis not present

## 2021-06-21 DIAGNOSIS — M791 Myalgia, unspecified site: Secondary | ICD-10-CM | POA: Diagnosis not present

## 2021-06-21 DIAGNOSIS — M9902 Segmental and somatic dysfunction of thoracic region: Secondary | ICD-10-CM | POA: Diagnosis not present

## 2021-06-21 DIAGNOSIS — M9905 Segmental and somatic dysfunction of pelvic region: Secondary | ICD-10-CM | POA: Diagnosis not present

## 2021-06-26 ENCOUNTER — Telehealth: Payer: Self-pay | Admitting: *Deleted

## 2021-06-26 NOTE — Telephone Encounter (Signed)
-----   Message from Garrel Ridgel, [place] sent at 06/26/2021  7:15 AM EST ----- Blood work looks perfect and may continue medication.

## 2021-06-29 DIAGNOSIS — M25552 Pain in left hip: Secondary | ICD-10-CM | POA: Diagnosis not present

## 2021-06-29 DIAGNOSIS — M5416 Radiculopathy, lumbar region: Secondary | ICD-10-CM | POA: Diagnosis not present

## 2021-07-04 DIAGNOSIS — M5136 Other intervertebral disc degeneration, lumbar region: Secondary | ICD-10-CM | POA: Diagnosis not present

## 2021-07-05 ENCOUNTER — Other Ambulatory Visit: Payer: Medicare PPO

## 2021-07-08 DIAGNOSIS — M5416 Radiculopathy, lumbar region: Secondary | ICD-10-CM | POA: Diagnosis not present

## 2021-07-08 DIAGNOSIS — M48062 Spinal stenosis, lumbar region with neurogenic claudication: Secondary | ICD-10-CM | POA: Diagnosis not present

## 2021-07-11 DIAGNOSIS — M5416 Radiculopathy, lumbar region: Secondary | ICD-10-CM | POA: Diagnosis not present

## 2021-07-25 ENCOUNTER — Encounter: Payer: Self-pay | Admitting: Podiatry

## 2021-07-25 ENCOUNTER — Ambulatory Visit: Payer: Medicare PPO | Admitting: Podiatry

## 2021-07-25 ENCOUNTER — Other Ambulatory Visit: Payer: Self-pay

## 2021-07-25 DIAGNOSIS — Z79899 Other long term (current) drug therapy: Secondary | ICD-10-CM

## 2021-07-25 DIAGNOSIS — B351 Tinea unguium: Secondary | ICD-10-CM

## 2021-07-25 DIAGNOSIS — M79676 Pain in unspecified toe(s): Secondary | ICD-10-CM | POA: Diagnosis not present

## 2021-07-25 DIAGNOSIS — L603 Nail dystrophy: Secondary | ICD-10-CM

## 2021-07-25 MED ORDER — TERBINAFINE HCL 250 MG PO TABS: 250.0000 mg | ORAL_TABLET | Freq: Every day | ORAL | 0 refills | Status: DC

## 2021-07-25 NOTE — Progress Notes (Signed)
She presents today she has completed her first 30 days of Lamisil she denies fever chills nausea run muscle aches pains itching or rashes.  No problems taking medicine at all.  She would like to have her nails cut.  Objective: Vital signs are stable she is alert and oriented x3 nails have not changed as of yet.  Otherwise there are thick yellow dystrophic clinical mycotic.  Assessment: Treatment with long-term Lamisil.  Plan: Debrided nails 1 through 5 bilaterally secondary to pain continue the use of terbinafine for 90 more days and we provided her with a requisition for a complete metabolic panel.  Follow-up with her questions or concerns or changes to her labs

## 2021-07-26 ENCOUNTER — Telehealth: Payer: Self-pay | Admitting: Podiatry

## 2021-07-26 LAB — COMPREHENSIVE METABOLIC PANEL
AG Ratio: 1.9 (calc) (ref 1.0–2.5)
ALT: 16 U/L (ref 6–29)
AST: 20 U/L (ref 10–35)
Albumin: 4.1 g/dL (ref 3.6–5.1)
Alkaline phosphatase (APISO): 55 U/L (ref 37–153)
BUN/Creatinine Ratio: 16 (calc) (ref 6–22)
BUN: 18 mg/dL (ref 7–25)
CO2: 28 mmol/L (ref 20–32)
Calcium: 9.4 mg/dL (ref 8.6–10.4)
Chloride: 104 mmol/L (ref 98–110)
Creat: 1.12 mg/dL — ABNORMAL HIGH (ref 0.60–1.00)
Globulin: 2.2 g/dL (calc) (ref 1.9–3.7)
Glucose, Bld: 94 mg/dL (ref 65–99)
Potassium: 3.6 mmol/L (ref 3.5–5.3)
Sodium: 143 mmol/L (ref 135–146)
Total Bilirubin: 0.2 mg/dL (ref 0.2–1.2)
Total Protein: 6.3 g/dL (ref 6.1–8.1)

## 2021-07-26 NOTE — Telephone Encounter (Signed)
Pharmacy - [facility] on [address]    terbinafine (LAMISIL) 250 MG tablet [[id number]]   Please resend patient changed pharmacies

## 2021-07-30 ENCOUNTER — Other Ambulatory Visit: Payer: Self-pay | Admitting: Podiatry

## 2021-07-30 ENCOUNTER — Telehealth: Payer: Self-pay | Admitting: *Deleted

## 2021-07-30 DIAGNOSIS — M5416 Radiculopathy, lumbar region: Secondary | ICD-10-CM | POA: Diagnosis not present

## 2021-07-30 MED ORDER — TERBINAFINE HCL 250 MG PO TABS: 250.0000 mg | ORAL_TABLET | Freq: Every day | ORAL | 0 refills | Status: DC

## 2021-07-30 NOTE — Telephone Encounter (Signed)
-----   Message from Garrel Ridgel, [place] sent at 07/30/2021  7:17 AM EST ----- Blood work looks good and may continue medication.

## 2021-08-05 DIAGNOSIS — M25511 Pain in right shoulder: Secondary | ICD-10-CM | POA: Diagnosis not present

## 2021-08-13 ENCOUNTER — Encounter: Payer: Self-pay | Admitting: Surgical

## 2021-08-13 ENCOUNTER — Ambulatory Visit (INDEPENDENT_AMBULATORY_CARE_PROVIDER_SITE_OTHER): Payer: Medicare PPO | Admitting: Surgical

## 2021-08-13 ENCOUNTER — Other Ambulatory Visit: Payer: Self-pay

## 2021-08-13 VITALS — BP 126/77 | HR 100 | Ht 66.0 in | Wt 191.0 lb

## 2021-08-13 DIAGNOSIS — Z411 Encounter for cosmetic surgery: Secondary | ICD-10-CM

## 2021-08-13 NOTE — Progress Notes (Signed)
Referring Provider Velna Hatchet, MD [address] [place],  [name] [address]   CC:  Chief Complaint  Patient presents with   consult      Ebony [name] is an 81 y.o. female.  HPI: Patient is a 81 year old female here for consultation for photoaging and facial aging.  She is interested in discussing options for improving evenness of her skin tone of her face as well as fine lines and wrinkles.  She has a history significant for multiple skin cancers with history of Mohs procedures and excisions.   Allergies  Allergen Reactions   Other Anaphylaxis    Muscle Relaxer-Unsure which one   Codeine Nausea And Vomiting   Statins     Other reaction(s): myalgias   Sulfa Antibiotics Other (See Comments)    Red streaks on nose- ? cellulitis    Outpatient Encounter Medications as of 08/13/2021  Medication Sig   allopurinol (ZYLOPRIM) 100 MG tablet Take 200 mg by mouth daily.   ALPRAZolam (XANAX) 0.5 MG tablet Take 0.5 mg by mouth at bedtime as needed for anxiety or sleep.    cetirizine (ZYRTEC) 10 MG tablet Take 10 mg by mouth daily as needed for allergies.   citalopram (CELEXA) 20 MG tablet Take 20 mg by mouth daily.   clopidogrel (PLAVIX) 75 MG tablet Take by mouth.   docusate sodium (COLACE) 100 MG capsule Take 1 capsule (100 mg total) by mouth 2 (two) times daily.   fluticasone (FLONASE) 50 MCG/ACT nasal spray [name] 2 sprays into the nose daily as needed for allergies.    HYDROcodone-acetaminophen (NORCO) 5-325 MG tablet Take 1-2 tablets by mouth every 4 (four) hours as needed for moderate pain.   Magnesium 500 MG TABS Take 500 mg by mouth daily.   meclizine (ANTIVERT) 12.5 MG tablet Take 12.5 mg by mouth every 6 (six) hours as needed for dizziness.    methocarbamol (ROBAXIN) 500 MG tablet Take 1 tablet (500 mg total) by mouth every 6 (six) hours as needed for muscle spasms.   morphine (MSIR) 15 MG tablet Take 15 mg by mouth 3 (three) times daily as needed.   moxifloxacin  (VIGAMOX) 0.5 % ophthalmic solution [name] 1 DROP INTO LEFT EYE 4 TIMES A DAY, START AFTER SURGERY   prednisoLONE acetate (PRED FORTE) 1 % ophthalmic suspension [name] 1 DROP INTO OPERATIVE LEFT EYE 4 TIMES A DAY   REPATHA SURECLICK 827 MG/ML SOAJ Inject into the skin.   senna (SENOKOT) 8.6 MG TABS tablet Take 2 tablets (17.2 mg total) by mouth at bedtime.   terbinafine (LAMISIL) 250 MG tablet Take 1 tablet (250 mg total) by mouth daily.   terbinafine (LAMISIL) 250 MG tablet Take 1 tablet (250 mg total) by mouth daily.   triamterene-hydrochlorothiazide (DYAZIDE) 37.5-25 MG capsule Take 1 capsule by mouth daily.   No facility-administered encounter medications on file as of 08/13/2021.     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 IS ON PLAVIX   Tendency toward bleeding easily [facility])     Past Surgical History:  Procedure Laterality Date   CARDIAC CATHETERIZATION     KNEE ARTHROPLASTY Right  11/29/2015   Procedure: RIGHT TOTAL KNEE ARTHROPLASTY ;  Surgeon: Rod Can, MD;  Location: [facility];  Service: Orthopedics;  Laterality: Right;   KNEE ARTHROSCOPY Right 10/05/2013   Procedure: RIGHT KNEE ARTHROSCOPY WITH medial and lateral DEBRIDEMENT chrondroplasty;  Surgeon: [name], MD;  Location: [facility];  Service: Orthopedics;  Laterality: Right;   MOHS SURGERY ON NOSE 01/2007, MOHS ON FACE 08/2012     TONSILLECTOMY      No family history on file.  Social History   Social History Narrative   Not on file     Physical Exam Vitals with BMI 01/29/2021 01/29/2021 01/29/2021  Height - - -  Weight - - -  BMI - - -  Systolic 311 216 244  Diastolic 83 91 88  Pulse 66 73 88    General:  No acute distress,  Alert and oriented, Non-Toxic, Normal  speech and affect HEENT: Photoaging noted of entire face, she has multiple scars noted over her face from previous skin cancer excisions.  She has fine lines and wrinkles noted  Assessment/Plan 81 year old female here for consultation for photoaging and fine lines and wrinkles.  She is a good candidate for BBL in regards to the photoaging.  She is also a good candidate for halo in regards to resurfacing and improving the fine lines and wrinkles as well as improving the photoaging.  We discussed the pros and cons of BBL versus halo.  All of her questions were answered to her content.  Recommend calling with questions or concerns or if she would like to schedule any procedure.  Carola Rhine Halil Rentz 08/13/2021, 2:15 PM

## 2021-08-22 ENCOUNTER — Ambulatory Visit: Payer: Self-pay | Admitting: Orthopedic Surgery

## 2021-08-22 DIAGNOSIS — M48062 Spinal stenosis, lumbar region with neurogenic claudication: Secondary | ICD-10-CM

## 2021-08-22 NOTE — H&P (Signed)
Ebony [name] is an 81 y.o. female.   Chief Complaint: back and leg pain HPI: For associated symptoms, patient reports radiation down leg (left) but reports no numbness. For location, patient reports left. For quality, patient reports burning and stabbing. For severity, patient reports pain level 8/10. For duration, patient reports 2 months. For context, patient reports cannot identify. For aggravating factors, patient reports lying down and walking. For previous surgery, patient reports none. For prior imaging, patient reports x ray and mri. For previous injections, patient reports helped a little (short term). Mended down the anterior thigh. Occasionally into the lateral aspect of the calf. She has had epidurals that L3-4 L4-5 with short temporary relief of her pain  Pain is worse with standing relieved with sitting reduced by bending forward when she walks  She had worked with a trainer prior to this starting. I had an MRI of her lumbar spine and of her hip.  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 ON PLAVIX   Tendency toward bleeding easily [facility])     Past Surgical History:  Procedure Laterality Date   CARDIAC CATHETERIZATION     KNEE ARTHROPLASTY Right 11/29/2015   Procedure: RIGHT TOTAL KNEE ARTHROPLASTY ;  Surgeon: Rod Can, MD;  Location[facility];  Service: Orthopedics;  Laterality: Right;   KNEE ARTHROSCOPY Right 10/05/2013   Procedure: RIGHT KNEE ARTHROSCOPY WITH medial and lateral DEBRIDEMENT chrondroplasty;  Surgeon: [name], MD;  [facility];  Service: Orthopedics;  Laterality: Right;   MOHS SURGERY ON NOSE 01/2007, MOHS ON FACE 08/2012     TONSILLECTOMY      No family history on file. Social History:  reports  that she quit smoking about 49 years ago. Her smoking use included cigarettes. She has a 10.00 pack-year smoking history. She has never used smokeless tobacco. She reports current alcohol use. She reports that she does not use drugs.  Allergies:  Allergies  Allergen Reactions   Other Anaphylaxis    Muscle Relaxer-Unsure which one   Codeine Nausea And Vomiting   Statins     Other reaction(s): myalgias   Sulfa Antibiotics Other (See Comments)    Red streaks on nose- ? cellulitis    (Not in a hospital admission)   No results found for this or any previous visit (from the past 48 hour(s)). No results found.  Review of Systems  Constitutional: Negative.   HENT: Negative.    Eyes: Negative.   Respiratory: Negative.    Cardiovascular: Negative.   Gastrointestinal: Negative.   Endocrine: Negative.   Genitourinary: Negative.   Musculoskeletal:  Positive for back pain and gait problem.  Skin: Negative.   Neurological:  Positive for weakness and numbness.   There were no vitals taken for this visit. Physical Exam Constitutional:      Appearance: Normal appearance.  HENT:     Head: Normocephalic and atraumatic.     Right Ear: External ear normal.     Left Ear: External ear normal.     Nose: Nose normal.     Mouth/Throat:     Pharynx: Oropharynx is clear.  Eyes:  Conjunctiva/sclera: Conjunctivae normal.  Cardiovascular:     Rate and Rhythm: Normal rate and regular rhythm.     Pulses: Normal pulses.     Heart sounds: Normal heart sounds.  Pulmonary:     Effort: Pulmonary effort is normal.     Breath sounds: Normal breath sounds.  Abdominal:     General: Bowel sounds are normal.  Musculoskeletal:     Cervical back: Normal range of motion.     Comments: Patient has weakness in hip flexion as well as knee extension. Sensation is intact. Reflexes are knee reflex. On the left. Good dorsiflexion plantarflexion of the foot.  Skin:    General: Skin is warm and dry.   Neurological:     Mental Status: She is alert.    MRI of her hip demonstrates diffuse chondral thinning and mild subchondral cystic changes there is labral degeneration minimal tendinosis of the gluteus.  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 Multilevel facet arthropathy. Associated lateral recess stenosis on the left with left L4 nerve root impingement and contact as well as L3 nerve root effacement. Minimal listhesis of L4 and L5 with moderate stenosis at L4-5 and bilateral L5 nerve root contact.  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 Possible contribution of L4-5. Refractory rest activity modification epidurals at L3-4 L4-5 and therapy  Plan:  We discussed living with her symptoms versus a lumbar decompression. She is unable to live with her symptoms she would like to proceed with a decompression  We discussed decompression L3-4 and probable L4-5.  I had an extensive discussion with the patient concerning the pathology relevant anatomy and treatment options. At this point exhausting conservative treatment and in the presence of a neurologic deficit we discussed microlumbar decompression. I discussed the risks and benefits including bleeding, infection, DVT, PE, anesthetic complications, worsening in their symptoms, improvement in their symptoms, C SF leakage, epidural fibrosis, need for future surgeries such as revision discectomy and lumbar fusion. I also indicated that this is an operation to basically decompress the nerve roots to allow recovery as opposed to fixing a herniated disc if it is encountered and that the incidence of recurrent chest disc herniation can approach 15%. Also that nerve root recovery is variable and may not recover completely. Any ligament or bone that is contributing to compressing the nerves will be removed as  well.  I discussed the operative course including overnight in the hospital. Immediate ambulation. Follow-up in 2 weeks for suture removal. 6 weeks until healing of the herniation and surgical incision followed by 6 weeks of reconditioning and strengthening of the core musculature. Also discussed the need to employ the concepts of disc pressure management and core motion following the surgery to minimize the risk of recurrent disc herniation. We will obtain preoperative clearance i if necessary and proceed accordingly.  No history of DVT or MRSA.  Plan lumbar decompression L3-4 possible L4-5  Cecilie Kicks, PA-C for Dr Tonita Cong 08/22/2021, 4:22 PM

## 2021-08-22 NOTE — H&P (View-Only) (Signed)
Ebony [name] is an 81 y.o. female.   Chief Complaint: back and leg pain HPI: For associated symptoms, patient reports radiation down leg (left) but reports no numbness. For location, patient reports left. For quality, patient reports burning and stabbing. For severity, patient reports pain level 8/10. For duration, patient reports 2 months. For context, patient reports cannot identify. For aggravating factors, patient reports lying down and walking. For previous surgery, patient reports none. For prior imaging, patient reports x ray and mri. For previous injections, patient reports helped a little (short term). Mended down the anterior thigh. Occasionally into the lateral aspect of the calf. She has had epidurals that L3-4 L4-5 with short temporary relief of her pain  Pain is worse with standing relieved with sitting reduced by bending forward when she walks  She had worked with a trainer prior to this starting. I had an MRI of her lumbar spine and of her hip.  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   Tendency toward bleeding easily [facility])     Past Surgical History:  Procedure Laterality Date   CARDIAC CATHETERIZATION     KNEE ARTHROPLASTY Right 11/29/2015   Procedure: RIGHT TOTAL KNEE ARTHROPLASTY ;  Surgeon: Rod Can, MD;  Location: [facility];  Service: Orthopedics;  Laterality: Right;   KNEE ARTHROSCOPY Right 10/05/2013   Procedure: RIGHT KNEE ARTHROSCOPY WITH medial and lateral DEBRIDEMENT chrondroplasty;  Surgeon: [name], MD;  Location: [facility];  Service: Orthopedics;  Laterality: Right;   MOHS SURGERY ON NOSE 01/2007, MOHS ON FACE 08/2012     TONSILLECTOMY      No family history on file. Social History:  reports  that she quit smoking about 49 years ago. Her smoking use included cigarettes. She has a 10.00 pack-year smoking history. She has never used smokeless tobacco. She reports current alcohol use. She reports that she does not use drugs.  Allergies:  Allergies  Allergen Reactions   Other Anaphylaxis    Muscle Relaxer-Unsure which one   Codeine Nausea And Vomiting   Statins     Other reaction(s): myalgias   Sulfa Antibiotics Other (See Comments)    Red streaks on nose- ? cellulitis    (Not in a hospital admission)   No results found for this or any previous visit (from the past 48 hour(s)). No results found.  Review of Systems  Constitutional: Negative.   HENT: Negative.    Eyes: Negative.   Respiratory: Negative.    Cardiovascular: Negative.   Gastrointestinal: Negative.   Endocrine: Negative.   Genitourinary: Negative.   Musculoskeletal:  Positive for back pain and gait problem.  Skin: Negative.   Neurological:  Positive for weakness and numbness.   There were no vitals taken for this visit. Physical Exam Constitutional:      Appearance: Normal appearance.  HENT:     Head: Normocephalic and atraumatic.     Right Ear: External ear normal.     Left Ear: External ear normal.     Nose: Nose normal.     Mouth/Throat:     Pharynx: Oropharynx is clear.  Eyes:  Conjunctiva/sclera: Conjunctivae normal.  Cardiovascular:     Rate and Rhythm: Normal rate and regular rhythm.     Pulses: Normal pulses.     Heart sounds: Normal heart sounds.  Pulmonary:     Effort: Pulmonary effort is normal.     Breath sounds: Normal breath sounds.  Abdominal:     General: Bowel sounds are normal.  Musculoskeletal:     Cervical back: Normal range of motion.     Comments: Patient has weakness in hip flexion as well as knee extension. Sensation is intact. Reflexes are knee reflex. On the left. Good dorsiflexion plantarflexion of the foot.  Skin:    General: Skin is warm and dry.   Neurological:     Mental Status: She is alert.    MRI of her hip demonstrates diffuse chondral thinning and mild subchondral cystic changes there is labral degeneration minimal tendinosis of the gluteus.  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 Multilevel facet arthropathy. Associated lateral recess stenosis on the left with left L4 nerve root impingement and contact as well as L3 nerve root effacement. Minimal listhesis of L4 and L5 with moderate stenosis at L4-5 and bilateral L5 nerve root contact.  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 Possible contribution of L4-5. Refractory rest activity modification epidurals at L3-4 L4-5 and therapy  Plan:  We discussed living with her symptoms versus a lumbar decompression. She is unable to live with her symptoms she would like to proceed with a decompression  We discussed decompression L3-4 and probable L4-5.  I had an extensive discussion with the patient concerning the pathology relevant anatomy and treatment options. At this point exhausting conservative treatment and in the presence of a neurologic deficit we discussed microlumbar decompression. I discussed the risks and benefits including bleeding, infection, DVT, PE, anesthetic complications, worsening in their symptoms, improvement in their symptoms, C SF leakage, epidural fibrosis, need for future surgeries such as revision discectomy and lumbar fusion. I also indicated that this is an operation to basically decompress the nerve roots to allow recovery as opposed to fixing a herniated disc if it is encountered and that the incidence of recurrent chest disc herniation can approach 15%. Also that nerve root recovery is variable and may not recover completely. Any ligament or bone that is contributing to compressing the nerves will be removed as  well.  I discussed the operative course including overnight in the hospital. Immediate ambulation. Follow-up in 2 weeks for suture removal. 6 weeks until healing of the herniation and surgical incision followed by 6 weeks of reconditioning and strengthening of the core musculature. Also discussed the need to employ the concepts of disc pressure management and core motion following the surgery to minimize the risk of recurrent disc herniation. We will obtain preoperative clearance i if necessary and proceed accordingly.  No history of DVT or MRSA.  Plan lumbar decompression L3-4 possible L4-5  Cecilie Kicks, PA-C for Dr Tonita Cong 08/22/2021, 4:22 PM

## 2021-09-02 NOTE — Pre-Procedure Instructions (Signed)
Surgical Instructions    Your procedure is scheduled on Thursday, February 23rd.  Report to [facility] Main Entrance "A" at 05:30 A.M., then check in with the Admitting office.  Call this number if you have problems the morning of surgery:  [phone]   If you have any questions prior to your surgery date call [phone]: Open Monday-Friday 8am-4pm    Remember:  Do not eat after midnight the night before your surgery  You may drink clear liquids until 04:30 AM the morning of your surgery.   Clear liquids allowed are: Water, Non-Citrus Juices (without pulp), Carbonated Beverages, Clear Tea, Black Coffee Only (NO MILK, CREAM OR POWDERED CREAMER of any kind), and Gatorade.   Patient Instructions  The night before surgery:  No food after midnight. ONLY clear liquids after midnight  The day of surgery (if you do NOT have diabetes):  Drink ONE (1) Pre-Surgery Clear Ensure by 04:30 AM the morning of surgery. Drink in one sitting. Do not sip.  This drink was given to you during your hospital  pre-op appointment visit.  Nothing else to drink after completing the  Pre-Surgery Clear Ensure.          If you have questions, please contact your surgeons office.      Take these medicines the morning of surgery with A SIP OF WATER  allopurinol (ZYLOPRIM) citalopram (CELEXA)  If needed: methocarbamol (ROBAXIN) morphine (MSIR)   As of today, STOP taking any Aspirin (unless otherwise instructed by your surgeon) Aleve, Naproxen, Ibuprofen, Motrin, Advil, Goody's, BC's, all herbal medications, fish oil, and all vitamins.                      Do NOT Smoke (Tobacco/Vaping) for 24 hours prior to your procedure.  If you use a CPAP at night, you may bring your mask/headgear for your overnight stay.   Contacts, glasses, piercing's, hearing aid's, dentures or partials may not be worn into surgery, please bring cases for these belongings.    For patients admitted to the hospital,  discharge time will be determined by your treatment team.   Patients discharged the day of surgery will not be allowed to drive home, and someone needs to stay with them for 24 hours.  NO VISITORS WILL BE ALLOWED IN PRE-OP WHERE PATIENTS ARE PREPPED FOR SURGERY.  ONLY 1 SUPPORT PERSON MAY BE PRESENT IN THE WAITING ROOM WHILE YOU ARE IN SURGERY.  IF YOU ARE TO BE ADMITTED, ONCE YOU ARE IN YOUR ROOM YOU WILL BE ALLOWED TWO (2) VISITORS. (1) VISITOR MAY STAY OVERNIGHT BUT MUST ARRIVE TO THE ROOM BY 8pm.  Minor children may have two parents present. Special consideration for safety and communication needs will be reviewed on a case by case basis.   Special instructions:   [facility]- Preparing For Surgery  Before surgery, you can play an important role. Because skin is not sterile, your skin needs to be as free of germs as possible. You can reduce the number of germs on your skin by washing with CHG (chlorahexidine gluconate) Soap before surgery.  CHG is an antiseptic cleaner which kills germs and bonds with the skin to continue killing germs even after washing.    Oral Hygiene is also important to reduce your risk of infection.  Remember - BRUSH YOUR TEETH THE MORNING OF SURGERY WITH YOUR REGULAR TOOTHPASTE  Please do not use if you have an allergy to CHG or antibacterial soaps. If your skin becomes  reddened/irritated stop using the CHG.  Do not shave (including legs and underarms) for at least 48 hours prior to first CHG shower. It is OK to shave your face.  Please follow these instructions carefully.   Shower the NIGHT BEFORE SURGERY and the MORNING OF SURGERY  If you chose to wash your hair, wash your hair first as usual with your normal shampoo.  After you shampoo, rinse your hair and body thoroughly to remove the shampoo.  Use CHG Soap as you would any other liquid soap. You can apply CHG directly to the skin and wash gently with a scrungie or a clean washcloth.   Apply the CHG Soap to  your body ONLY FROM THE NECK DOWN.  Do not use on open wounds or open sores. Avoid contact with your eyes, ears, mouth and genitals (private parts). Wash Face and genitals (private parts)  with your normal soap.   Wash thoroughly, paying special attention to the area where your surgery will be performed.  Thoroughly rinse your body with warm water from the neck down.  DO NOT shower/wash with your normal soap after using and rinsing off the CHG Soap.  Pat yourself dry with a CLEAN TOWEL.  Wear CLEAN PAJAMAS to bed the night before surgery  Place CLEAN SHEETS on your bed the night before your surgery  DO NOT SLEEP WITH PETS.   Day of Surgery: Shower with CHG soap. Do not wear jewelry, make up, nail polish, gel polish, artificial nails, or any other type of covering on natural nails including finger and toenails. If patients have artificial nails, gel coating, etc. that need to be removed by a nail salon please have this removed prior to surgery. Surgery may need to be canceled/delayed if the surgeon/anesthesiologist feels like the patient is unable to be adequately monitored. Do not wear lotions, powders, perfumes, or deodorant. Do not shave 48 hours prior to surgery.   Do not bring valuables to the hospital. [facility] is not responsible for any belongings or valuables. Wear Clean/Comfortable clothing the morning of surgery Remember to brush your teeth WITH YOUR REGULAR TOOTHPASTE.   Please read over the following fact sheets that you were given.   3 days prior to your procedure or After your COVID test   You are not required to quarantine however you are required to wear a well-fitting mask when you are out and around people not in your household. If your mask becomes wet or soiled, replace with a new one.   Wash your hands often with soap and water for 20 seconds or clean your hands with an alcohol-based hand sanitizer that contains at least 60% alcohol.   Do not share  personal items.   Notify your provider:  o if you are in close contact with someone who has COVID  o or if you develop a fever of 100.4 or greater, sneezing, cough, sore throat, shortness of breath or body aches.

## 2021-09-03 ENCOUNTER — Encounter (HOSPITAL_COMMUNITY): Payer: Self-pay

## 2021-09-03 ENCOUNTER — Other Ambulatory Visit: Payer: Self-pay

## 2021-09-03 ENCOUNTER — Ambulatory Visit (HOSPITAL_COMMUNITY)
Admission: RE | Admit: 2021-09-03 | Discharge: 2021-09-03 | Disposition: A | Discharge status: Home / Self Care | Payer: Medicare PPO | Source: Ambulatory Visit | Attending: Orthopedic Surgery | Admitting: Orthopedic Surgery

## 2021-09-03 ENCOUNTER — Encounter (HOSPITAL_COMMUNITY)
Admission: RE | Admit: 2021-09-03 | Discharge: 2021-09-03 | Disposition: A | Discharge status: Home / Self Care | Payer: Medicare PPO | Source: Ambulatory Visit | Attending: Specialist | Admitting: Specialist

## 2021-09-03 VITALS — BP 114/87 | HR 110 | Temp 98.3°F | Resp 18 | Ht 66.0 in | Wt 190.5 lb

## 2021-09-03 DIAGNOSIS — M5126 Other intervertebral disc displacement, lumbar region: Secondary | ICD-10-CM | POA: Insufficient documentation

## 2021-09-03 DIAGNOSIS — Z01818 Encounter for other preprocedural examination: Secondary | ICD-10-CM | POA: Insufficient documentation

## 2021-09-03 DIAGNOSIS — M48062 Spinal stenosis, lumbar region with neurogenic claudication: Secondary | ICD-10-CM | POA: Insufficient documentation

## 2021-09-03 LAB — BASIC METABOLIC PANEL
Anion gap: 14 (ref 5–15)
BUN: 17 mg/dL (ref 8–23)
CO2: 25 mmol/L (ref 22–32)
Calcium: 9.7 mg/dL (ref 8.9–10.3)
Chloride: 101 mmol/L (ref 98–111)
Creatinine, Ser: 1.14 mg/dL — ABNORMAL HIGH (ref 0.44–1.00)
GFR, Estimated: 49 mL/min — ABNORMAL LOW (ref >60)
Glucose, Bld: 120 mg/dL — ABNORMAL HIGH (ref 70–99)
Potassium: 3.6 mmol/L (ref 3.5–5.1)
Sodium: 140 mmol/L (ref 135–145)

## 2021-09-03 LAB — CBC
HCT: 42.5 % (ref 36.0–46.0)
Hemoglobin: 14.7 g/dL (ref 12.0–15.0)
MCH: 34.9 pg — ABNORMAL HIGH (ref 26.0–34.0)
MCHC: 34.6 g/dL (ref 30.0–36.0)
MCV: 101 fL — ABNORMAL HIGH (ref 80.0–100.0)
Platelets: 438 10*3/uL — ABNORMAL HIGH (ref 150–400)
RBC: 4.21 MIL/uL (ref 3.87–5.11)
RDW: 12.2 % (ref 11.5–15.5)
WBC: 9.4 10*3/uL (ref 4.0–10.5)
nRBC: 0 % (ref 0.0–0.2)

## 2021-09-03 LAB — SURGICAL PCR SCREEN
MRSA, PCR: NEGATIVE
Staphylococcus aureus: POSITIVE — AB

## 2021-09-03 NOTE — Progress Notes (Signed)
PCP - Dr. Ardeth Perfect Cardiologist - patient denies  PPM/ICD - n/a Device Orders -  Rep Notified -   Chest x-ray - n/a Lumbar xray - 09/03/21 EKG - 09/03/21 Stress Test - patient denies ECHO - patient denies Cardiac Cath - 20-25 years ago; Patient states she is not sure why it was done.  States she had a TIA and they did all types of testing; she states it was done here at [facility] and did not require any follow up  Sleep Study - patient denies; stop bang negative CPAP -   Fasting Blood Sugar - n/a Checks Blood Sugar _____ times a day  Blood Thinner Instructions: stop 5 days before; last dose to be on Friday 09/06/21 Aspirin Instructions:  ERAS Protcol - Clears until 0430 PRE-SURGERY Ensure or G2- given at PAT, to be completed by 0430  COVID TEST- scheduled for Monday 09/09/21 at 11:00   Anesthesia review: yes, abnormal EKG  Patient denies shortness of breath, fever, cough and chest pain at PAT appointment   All instructions explained to the patient, with a verbal understanding of the material. Patient agrees to go over the instructions while at home for a better understanding. Patient also instructed to self quarantine after being tested for COVID-19. The opportunity to ask questions was provided.

## 2021-09-03 NOTE — Pre-Procedure Instructions (Signed)
Surgical Instructions    Your procedure is scheduled on Thursday, February 23rd.  Report to [facility] Main Entrance "A" at 05:30 A.M., then check in with the Admitting office.  Call this number if you have problems the morning of surgery:  [phone]   Remember:  Do not eat after midnight the night before your surgery  You may drink clear liquids until 04:30 AM the morning of your surgery.   Clear liquids allowed are: Water, Non-Citrus Juices (without pulp), Carbonated Beverages, Clear Tea, Black Coffee Only (NO MILK, CREAM OR POWDERED CREAMER of any kind), and Gatorade.   Patient Instructions  The night before surgery:  No food after midnight. ONLY clear liquids after midnight   The day of surgery (if you do NOT have diabetes):  Drink ONE (1) Pre-Surgery Clear Ensure by 04:30 AM the morning of surgery. Drink in one sitting. Do not sip.  This drink was given to you during your hospital  pre-op appointment visit. Nothing else to drink after completing the  Pre-Surgery Clear Ensure.        If you have questions, please contact your surgeons office.     Take these medicines the morning of surgery with A SIP OF WATER  allopurinol (ZYLOPRIM) citalopram (CELEXA)  If needed: methocarbamol (ROBAXIN) morphine (MSIR)   Follow your surgeon's instructions on when to stop Clopidogrel (Plavix).  If no instructions were given by your surgeon then you will need to call the office to get those instructions.    As of today, STOP taking any Aspirin (unless otherwise instructed by your surgeon) or aspiring-containing products, NSAIDS - Aleve, Naproxen, Ibuprofen, Motrin, Advil, Goody's, BC's, all herbal medications, fish oil, and all vitamins.                     Do NOT Smoke (Tobacco/Vaping) for 24 hours prior to your procedure.  If you use a CPAP at night, you may bring your mask/headgear for your overnight stay.   Contacts, glasses, piercing's, hearing aid's, dentures or partials  may not be worn into surgery, please bring cases for these belongings.    For patients admitted to the hospital, discharge time will be determined by your treatment team.   Patients discharged the day of surgery will not be allowed to drive home, and someone needs to stay with them for 24 hours.  NO VISITORS WILL BE ALLOWED IN PRE-OP WHERE PATIENTS ARE PREPPED FOR SURGERY.  ONLY 1 SUPPORT PERSON MAY BE PRESENT IN THE WAITING ROOM WHILE YOU ARE IN SURGERY.  IF YOU ARE TO BE ADMITTED, ONCE YOU ARE IN YOUR ROOM YOU WILL BE ALLOWED TWO (2) VISITORS. (1) VISITOR MAY STAY OVERNIGHT BUT MUST ARRIVE TO THE ROOM BY 8pm.  Minor children may have two parents present. Special consideration for safety and communication needs will be reviewed on a case by case basis.   Special instructions:   Ebony [name]- Preparing For Surgery  Before surgery, you can play an important role. Because skin is not sterile, your skin needs to be as free of germs as possible. You can reduce the number of germs on your skin by washing with CHG (chlorahexidine gluconate) Soap before surgery.  CHG is an antiseptic cleaner which kills germs and bonds with the skin to continue killing germs even after washing.    Oral Hygiene is also important to reduce your risk of infection.  Remember - BRUSH YOUR TEETH THE MORNING OF SURGERY WITH YOUR REGULAR TOOTHPASTE  Please  do not use if you have an allergy to CHG or antibacterial soaps. If your skin becomes reddened/irritated stop using the CHG.  Do not shave (including legs and underarms) for at least 48 hours prior to first CHG shower. It is OK to shave your face.  Please follow these instructions carefully.   Shower the NIGHT BEFORE SURGERY and the MORNING OF SURGERY  If you chose to wash your hair, wash your hair first as usual with your normal shampoo.  After you shampoo, rinse your hair and body thoroughly to remove the shampoo.  Use CHG Soap as you would any other liquid soap. You  can apply CHG directly to the skin and wash gently with a scrungie or a clean washcloth.   Apply the CHG Soap to your body ONLY FROM THE NECK DOWN.  Do not use on open wounds or open sores. Avoid contact with your eyes, ears, mouth and genitals (private parts). Wash Face and genitals (private parts)  with your normal soap.   Wash thoroughly, paying special attention to the area where your surgery will be performed.  Thoroughly rinse your body with warm water from the neck down.  DO NOT shower/wash with your normal soap after using and rinsing off the CHG Soap.  Pat yourself dry with a CLEAN TOWEL.  Wear CLEAN PAJAMAS to bed the night before surgery  Place CLEAN SHEETS on your bed the night before your surgery  DO NOT SLEEP WITH PETS.   Day of Surgery: Shower with CHG soap. Do not wear jewelry, make up, nail polish, gel polish, artificial nails, or any other type of covering on natural nails including finger and toenails. If patients have artificial nails, gel coating, etc. that need to be removed by a nail salon please have this removed prior to surgery. Surgery may need to be canceled/delayed if the surgeon/anesthesiologist feels like the patient is unable to be adequately monitored. Do not wear lotions, powders, perfumes, or deodorant. Do not shave 48 hours prior to surgery.   Do not bring valuables to the hospital. [facility] is not responsible for any belongings or valuables. Wear Clean/Comfortable clothing the morning of surgery Remember to brush your teeth WITH YOUR REGULAR TOOTHPASTE.   Please read over the following fact sheets that you were given.   3 days prior to your procedure or After your COVID test  You are not required to quarantine however you are required to wear a well-fitting mask when you are out and around people not in your household. If your mask becomes wet or soiled, replace with a new one.  Wash your hands often with soap and water for 20 seconds or  clean your hands with an alcohol-based hand sanitizer that contains at least 60% alcohol.  Do not share personal items.  Notify your provider: if you are in close contact with someone who has COVID or if you develop a fever of 100.4 or greater, sneezing, cough, sore throat, shortness of breath or body aches.

## 2021-09-05 NOTE — Anesthesia Preprocedure Evaluation (Addendum)
Anesthesia Evaluation  Patient identified by MRN, date of birth, ID band Patient awake    Reviewed: Allergy & Precautions, NPO status , Patient's Chart, lab work & pertinent test results  Airway Mallampati: III  TM Distance: <3 FB Neck ROM: Full    Dental  (+) Caps, Dental Advisory Given   Pulmonary former smoker,    breath sounds clear to auscultation       Cardiovascular hypertension, Pt. on medications + Valvular Problems/Murmurs  Rhythm:Regular Rate:Normal     Neuro/Psych PSYCHIATRIC DISORDERS Depression CVA    GI/Hepatic Neg liver ROS, GERD  ,  Endo/Other  negative endocrine ROS  Renal/GU negative Renal ROS     Musculoskeletal   Abdominal Normal abdominal exam  (+)   Peds  Hematology negative hematology ROS (+)   Anesthesia Other Findings   Reproductive/Obstetrics                           Anesthesia Physical Anesthesia Plan  ASA: 3  Anesthesia Plan: General   Post-op Pain Management:    Induction: Intravenous  PONV Risk Score and Plan: 4 or greater and Ondansetron, Dexamethasone and Treatment may vary due to age or medical condition  Airway Management Planned: Oral ETT  Additional Equipment: None  Intra-op Plan:   Post-operative Plan: Extubation in OR  Informed Consent: I have reviewed the patients History and Physical, chart, labs and discussed the procedure including the risks, benefits and alternatives for the proposed anesthesia with the patient or authorized representative who has indicated his/her understanding and acceptance.     Dental advisory given  Plan Discussed with: CRNA  Anesthesia Plan Comments: (PAT note written 09/05/2021 by Myra Gianotti, PA-C. )      Anesthesia Quick Evaluation

## 2021-09-05 NOTE — Progress Notes (Signed)
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 "Tendency toward bleeding easily" was entered in her chart 11/19/15 (during pre-op RN interview for right TKA 11/29/15 with GETA; EBL 300 cc for that procedure; EBL 10 cc for right knee arthroscopy 10/05/13, done under general with LMA).   She is on Plavix for remote history of TIA.  She reported instructions to hold for 5 days prior to surgery.  EKG appears stable since at least 2017. Cr 1.14 which is consistent with previous results.  She denies shortness of breath and chest pain at PAT RN visit.  Preoperative COVID-19 testing is scheduled for 09/09/2021.  Anesthesia team to evaluate on the day of surgery.   VS: BP 114/87    Pulse (!) 110    Temp 36.8 C (Oral)    Resp 18    Ht 5\' 6"  (1.676 m)    Wt 86.4 kg    SpO2 98%    BMI 30.75 kg/m  HR was 99 bpm on EKG.    PROVIDERS: Velna Hatchet, MD is PCP    LABS: Labs reviewed: Acceptable for surgery. (all labs ordered are listed, but only abnormal results are displayed)  Labs Reviewed  SURGICAL PCR SCREEN - Abnormal; Notable for the following components:      Result Value   Staphylococcus aureus POSITIVE (*)    All other components within normal limits  CBC - Abnormal; Notable for the following components:   MCV 101.0 (*)    MCH 34.9 (*)    Platelets 438 (*)    All other components within normal limits  BASIC METABOLIC PANEL - Abnormal; Notable for the following  components:   Glucose, Bld 120 (*)    Creatinine, Ser 1.14 (*)    GFR, Estimated 49 (*)    All other components within normal limits     IMAGES: Xray L-spine 09/03/21: FINDINGS: There is no evidence of lumbar spine fracture. There is mild dextrocurvature of the lumbar spine. Alignment is otherwise within normal limits. Intervertebral disc space narrowing and degenerative endplate changes are present at multiple levels. Facet arthropathy is noted in the lower lumbar spine. There is atherosclerotic calcification of the aorta. IMPRESSION: Multilevel degenerative changes.   MRI Brain 02/11/21:  IMPRESSION: No acute or reversible finding. Old left cerebellar infarction. Chronic small-vessel ischemic changes of the pons and cerebral hemispheric white matter.      EKG: 09/03/21: Normal sinus rhythm Left axis deviation Possible Inferior infarct , age undetermined Poor R wave progression Abnormal ECG When compared with ECG of 21-Sep-2002 06:22, PREVIOUS ECG IS PRESENT Confirmed by Loralie Champagne [phone]) on 09/04/2021 1:09:07 AM - EKG is stable when compared to 10/08/15 tracing: She has at least two prior EKG tracings scanned under the Media tab, Correspondence. Current EKG is not significantly changed when compared to 10/08/15 tracing priori to right TKA. Inferior lead are overall similar but r wave progression in V5-6 is worse when compared to tracing from 09/05/13.  CV: US Carotid 04/23/20: Summary:  - Right Carotid: Velocities in the right ICA are consistent with a 1-39% stenosis. The extracranial vessels were near-normal with only minimal wall thickening or plaque.  - Left Carotid: Velocities in the left ICA are consistent with a 1-39% stenosis. The extracranial vessels were near-normal with only minimal wall thickening or plaque.  - Vertebrals: Bilateral vertebral arteries demonstrate antegrade flow.    Holter monitor 02/05/16: Sinus rhythm.  Premature ventricular contractions  (238 during monitoring period) Premature atrial contractions (23 during monitoring period) No ventricular tachcyardia No atrial fibrillation/flutter   She believes she may have had a cardiac cath done at [facility] > 20 years ago during TIA evaluation.   Past Medical History:  Diagnosis Date   Arthritis    Cancer ([place])    BASAL CELL CA   Depression    GERD (gastroesophageal reflux disease)    TAKES TUMS PRN   Heart murmur    "childhood" - no follow up as an adult   Hypertension    Osteopenia    Shortness of breath dyspnea    WITH EXERTION   Stroke ([place])    TIA 2004- PT IS ON PLAVIX   Tendency toward bleeding easily [facility])     Past Surgical History:  Procedure Laterality Date   BUNIONECTOMY Right    CARDIAC CATHETERIZATION     CATARACT EXTRACTION Bilateral    KNEE ARTHROPLASTY Right 11/29/2015   Procedure: RIGHT TOTAL KNEE ARTHROPLASTY ;  Surgeon: Rod Can, MD;  Location: [facility] ORS;  Service: Orthopedics;  Laterality: Right;   KNEE ARTHROSCOPY Right 10/05/2013   Procedure: RIGHT KNEE ARTHROSCOPY WITH medial and lateral DEBRIDEMENT chrondroplasty;  Surgeon: [name], MD;  Location: [facility] ORS;  Service: Orthopedics;  Laterality: Right;   MOHS SURGERY ON NOSE 01/2007, MOHS ON FACE 08/2012     TONSILLECTOMY      MEDICATIONS:  allopurinol (ZYLOPRIM) 100 MG tablet   ALPRAZolam (XANAX) 0.5 MG tablet   citalopram (CELEXA) 20 MG tablet   clopidogrel (PLAVIX) 75 MG tablet   docusate sodium (COLACE) 100 MG capsule   HYDROcodone-acetaminophen (NORCO) 5-325 MG tablet   methocarbamol (ROBAXIN) 500 MG tablet   methocarbamol (ROBAXIN) 750 MG tablet   morphine (MSIR) 15 MG tablet   REPATHA SURECLICK 657 MG/ML SOAJ   senna (SENOKOT) 8.6 MG TABS tablet   terbinafine (LAMISIL) 250 MG tablet   terbinafine (LAMISIL) 250 MG tablet   triamterene-hydrochlorothiazide (DYAZIDE) 37.5-25 MG capsule   No current facility-administered medications for this encounter.    Myra Gianotti,  PA-C Surgical Short Stay/Anesthesiology [facility] Phone [phone] [facility] Phone [phone] 09/05/2021 4:50 PM

## 2021-09-09 ENCOUNTER — Other Ambulatory Visit (HOSPITAL_COMMUNITY)
Admission: RE | Admit: 2021-09-09 | Discharge: 2021-09-09 | Disposition: A | Discharge status: Home / Self Care | Payer: Medicare PPO | Source: Ambulatory Visit | Attending: Specialist | Admitting: Specialist

## 2021-09-09 ENCOUNTER — Ambulatory Visit: Payer: Self-pay | Admitting: Orthopedic Surgery

## 2021-09-09 DIAGNOSIS — Z20822 Contact with and (suspected) exposure to covid-19: Secondary | ICD-10-CM | POA: Diagnosis not present

## 2021-09-09 DIAGNOSIS — Z01818 Encounter for other preprocedural examination: Secondary | ICD-10-CM | POA: Insufficient documentation

## 2021-09-09 LAB — SARS CORONAVIRUS 2 (TAT 6-24 HRS): SARS Coronavirus 2: NEGATIVE

## 2021-09-12 ENCOUNTER — Ambulatory Visit (HOSPITAL_COMMUNITY): Payer: Medicare PPO | Admitting: Vascular Surgery

## 2021-09-12 ENCOUNTER — Encounter (HOSPITAL_COMMUNITY): Payer: Self-pay | Admitting: Specialist

## 2021-09-12 ENCOUNTER — Ambulatory Visit (HOSPITAL_COMMUNITY): Payer: Medicare PPO

## 2021-09-12 ENCOUNTER — Encounter (HOSPITAL_COMMUNITY)
Admission: RE | Disposition: A | Discharge status: Home / Self Care | Payer: Self-pay | Source: Ambulatory Visit | Attending: Specialist

## 2021-09-12 ENCOUNTER — Ambulatory Visit (HOSPITAL_BASED_OUTPATIENT_CLINIC_OR_DEPARTMENT_OTHER): Payer: Medicare PPO | Admitting: Anesthesiology

## 2021-09-12 ENCOUNTER — Other Ambulatory Visit: Payer: Self-pay

## 2021-09-12 ENCOUNTER — Ambulatory Visit (HOSPITAL_COMMUNITY)
Admission: RE | Admit: 2021-09-12 | Discharge: 2021-09-12 | Disposition: A | Discharge status: Home / Self Care | Payer: Medicare PPO | Source: Ambulatory Visit | Attending: Specialist | Admitting: Specialist

## 2021-09-12 DIAGNOSIS — Z8673 Personal history of transient ischemic attack (TIA), and cerebral infarction without residual deficits: Secondary | ICD-10-CM

## 2021-09-12 DIAGNOSIS — M5416 Radiculopathy, lumbar region: Secondary | ICD-10-CM | POA: Diagnosis not present

## 2021-09-12 DIAGNOSIS — Z419 Encounter for procedure for purposes other than remedying health state, unspecified: Secondary | ICD-10-CM

## 2021-09-12 DIAGNOSIS — K219 Gastro-esophageal reflux disease without esophagitis: Secondary | ICD-10-CM | POA: Diagnosis not present

## 2021-09-12 DIAGNOSIS — I10 Essential (primary) hypertension: Secondary | ICD-10-CM | POA: Insufficient documentation

## 2021-09-12 DIAGNOSIS — Z87891 Personal history of nicotine dependence: Secondary | ICD-10-CM | POA: Diagnosis not present

## 2021-09-12 DIAGNOSIS — M48061 Spinal stenosis, lumbar region without neurogenic claudication: Secondary | ICD-10-CM | POA: Diagnosis not present

## 2021-09-12 DIAGNOSIS — M5136 Other intervertebral disc degeneration, lumbar region: Secondary | ICD-10-CM | POA: Diagnosis not present

## 2021-09-12 DIAGNOSIS — Z01818 Encounter for other preprocedural examination: Secondary | ICD-10-CM

## 2021-09-12 HISTORY — PX: LUMBAR LAMINECTOMY/DECOMPRESSION MICRODISCECTOMY: SHX5026

## 2021-09-12 SURGERY — LUMBAR LAMINECTOMY/DECOMPRESSION MICRODISCECTOMY 2 LEVELS
Anesthesia: General | Site: Spine Lumbar

## 2021-09-12 MED ORDER — METHOCARBAMOL 1000 MG/10ML IJ SOLN
500.0000 mg | Freq: Four times a day (QID) | INTRAVENOUS | Status: DC | PRN
  Filled 2021-09-12: qty 5

## 2021-09-12 MED ORDER — PHENYLEPHRINE 40 MCG/ML (10ML) SYRINGE FOR IV PUSH (FOR BLOOD PRESSURE SUPPORT)
PREFILLED_SYRINGE | INTRAVENOUS | Status: DC | PRN
  Administered 2021-09-12 (×3): 80 ug via INTRAVENOUS

## 2021-09-12 MED ORDER — POLYETHYLENE GLYCOL 3350 17 G PO PACK: 17.0000 g | PACK | Freq: Every day | ORAL | Status: DC | PRN

## 2021-09-12 MED ORDER — MENTHOL 3 MG MT LOZG: 1.0000 | LOZENGE | OROMUCOSAL | Status: DC | PRN

## 2021-09-12 MED ORDER — LACTATED RINGERS IV SOLN: INTRAVENOUS | Status: DC

## 2021-09-12 MED ORDER — TRIAMTERENE-HCTZ 37.5-25 MG PO TABS
1.0000 | ORAL_TABLET | Freq: Every day | ORAL | Status: DC
  Filled 2021-09-12: qty 1

## 2021-09-12 MED ORDER — ONDANSETRON HCL 4 MG PO TABS: 4.0000 mg | ORAL_TABLET | Freq: Four times a day (QID) | ORAL | Status: DC | PRN

## 2021-09-12 MED ORDER — PHENYLEPHRINE 40 MCG/ML (10ML) SYRINGE FOR IV PUSH (FOR BLOOD PRESSURE SUPPORT)
PREFILLED_SYRINGE | INTRAVENOUS | Status: AC
  Filled 2021-09-12: qty 10

## 2021-09-12 MED ORDER — FENTANYL CITRATE (PF) 250 MCG/5ML IJ SOLN
INTRAMUSCULAR | Status: DC | PRN
  Administered 2021-09-12 (×3): 50 ug via INTRAVENOUS

## 2021-09-12 MED ORDER — DEXAMETHASONE SODIUM PHOSPHATE 10 MG/ML IJ SOLN
INTRAMUSCULAR | Status: AC
  Filled 2021-09-12: qty 1

## 2021-09-12 MED ORDER — PHENOL 1.4 % MT LIQD: 1.0000 | OROMUCOSAL | Status: DC | PRN

## 2021-09-12 MED ORDER — TERBINAFINE HCL 250 MG PO TABS
250.0000 mg | ORAL_TABLET | Freq: Every day | ORAL | Status: DC
  Filled 2021-09-12: qty 1

## 2021-09-12 MED ORDER — ALLOPURINOL 100 MG PO TABS
200.0000 mg | ORAL_TABLET | Freq: Every day | ORAL | Status: DC
Start: 2021-09-13 — End: 2021-09-12

## 2021-09-12 MED ORDER — HYDROMORPHONE HCL 1 MG/ML IJ SOLN: 0.2500 mg | INTRAMUSCULAR | Status: DC | PRN

## 2021-09-12 MED ORDER — POLYETHYLENE GLYCOL 3350 17 G PO PACK: 17.0000 g | PACK | Freq: Every day | ORAL | 0 refills | Status: DC

## 2021-09-12 MED ORDER — TRANEXAMIC ACID-NACL 1000-0.7 MG/100ML-% IV SOLN
1000.0000 mg | INTRAVENOUS | Status: AC
  Administered 2021-09-12: 1000 mg via INTRAVENOUS
  Filled 2021-09-12: qty 100

## 2021-09-12 MED ORDER — CEFAZOLIN SODIUM-DEXTROSE 2-4 GM/100ML-% IV SOLN
2.0000 g | Freq: Three times a day (TID) | INTRAVENOUS | Status: DC
  Administered 2021-09-12: 2 g via INTRAVENOUS
  Filled 2021-09-12: qty 100

## 2021-09-12 MED ORDER — PROPOFOL 10 MG/ML IV BOLUS
INTRAVENOUS | Status: AC
  Filled 2021-09-12: qty 20

## 2021-09-12 MED ORDER — SUGAMMADEX SODIUM 200 MG/2ML IV SOLN
INTRAVENOUS | Status: DC | PRN
Start: 2021-09-12 — End: 2021-09-12
  Administered 2021-09-12: 200 mg via INTRAVENOUS

## 2021-09-12 MED ORDER — ONDANSETRON HCL 4 MG/2ML IJ SOLN
INTRAMUSCULAR | Status: AC
  Filled 2021-09-12: qty 2

## 2021-09-12 MED ORDER — PROMETHAZINE HCL 25 MG/ML IJ SOLN: 6.2500 mg | INTRAMUSCULAR | Status: DC | PRN

## 2021-09-12 MED ORDER — ACETAMINOPHEN 160 MG/5ML PO SOLN: 325.0000 mg | Freq: Once | ORAL | Status: DC | PRN

## 2021-09-12 MED ORDER — HYDROMORPHONE HCL 1 MG/ML IJ SOLN: 0.5000 mg | INTRAMUSCULAR | Status: DC | PRN

## 2021-09-12 MED ORDER — RISAQUAD PO CAPS
1.0000 | ORAL_CAPSULE | Freq: Every day | ORAL | Status: DC
  Filled 2021-09-12: qty 1

## 2021-09-12 MED ORDER — KCL IN DEXTROSE-NACL 20-5-0.45 MEQ/L-%-% IV SOLN: INTRAVENOUS | Status: DC

## 2021-09-12 MED ORDER — PROPOFOL 10 MG/ML IV BOLUS
INTRAVENOUS | Status: DC | PRN
  Administered 2021-09-12: 140 mg via INTRAVENOUS

## 2021-09-12 MED ORDER — ALUM & MAG HYDROXIDE-SIMETH 200-200-20 MG/5ML PO SUSP: 30.0000 mL | Freq: Four times a day (QID) | ORAL | Status: DC | PRN

## 2021-09-12 MED ORDER — TERBINAFINE HCL 250 MG PO TABS: 250.0000 mg | ORAL_TABLET | Freq: Every day | ORAL | Status: DC

## 2021-09-12 MED ORDER — LIDOCAINE 2% (20 MG/ML) 5 ML SYRINGE
INTRAMUSCULAR | Status: AC
  Filled 2021-09-12: qty 5

## 2021-09-12 MED ORDER — ACETAMINOPHEN 10 MG/ML IV SOLN
1000.0000 mg | INTRAVENOUS | Status: AC
  Administered 2021-09-12: 1000 mg via INTRAVENOUS
  Filled 2021-09-12: qty 100

## 2021-09-12 MED ORDER — THROMBIN 20000 UNITS EX SOLR: CUTANEOUS | Status: DC | PRN

## 2021-09-12 MED ORDER — ACETAMINOPHEN 325 MG PO TABS: 325.0000 mg | ORAL_TABLET | Freq: Once | ORAL | Status: DC | PRN

## 2021-09-12 MED ORDER — ONDANSETRON HCL 4 MG/2ML IJ SOLN
INTRAMUSCULAR | Status: DC | PRN
  Administered 2021-09-12: 4 mg via INTRAVENOUS

## 2021-09-12 MED ORDER — CHLORHEXIDINE GLUCONATE 0.12 % MT SOLN
15.0000 mL | Freq: Once | OROMUCOSAL | Status: AC
  Administered 2021-09-12: 15 mL via OROMUCOSAL
  Filled 2021-09-12: qty 15

## 2021-09-12 MED ORDER — DEXAMETHASONE SODIUM PHOSPHATE 10 MG/ML IJ SOLN
INTRAMUSCULAR | Status: DC | PRN
  Administered 2021-09-12: 10 mg via INTRAVENOUS

## 2021-09-12 MED ORDER — CEFAZOLIN SODIUM-DEXTROSE 2-4 GM/100ML-% IV SOLN
2.0000 g | INTRAVENOUS | Status: AC
  Administered 2021-09-12: 2 g via INTRAVENOUS
  Filled 2021-09-12: qty 100

## 2021-09-12 MED ORDER — DOCUSATE SODIUM 100 MG PO CAPS
100.0000 mg | ORAL_CAPSULE | Freq: Two times a day (BID) | ORAL | Status: DC
  Administered 2021-09-12: 100 mg via ORAL

## 2021-09-12 MED ORDER — BUPIVACAINE-EPINEPHRINE 0.5% -1:200000 IJ SOLN
INTRAMUSCULAR | Status: DC | PRN
Start: 2021-09-12 — End: 2021-09-12
  Administered 2021-09-12: 7 mL

## 2021-09-12 MED ORDER — ALPRAZOLAM 0.5 MG PO TABS: 0.5000 mg | ORAL_TABLET | Freq: Every evening | ORAL | Status: DC | PRN

## 2021-09-12 MED ORDER — CITALOPRAM HYDROBROMIDE 20 MG PO TABS: 20.0000 mg | ORAL_TABLET | Freq: Every day | ORAL | Status: DC

## 2021-09-12 MED ORDER — FENTANYL CITRATE (PF) 250 MCG/5ML IJ SOLN
INTRAMUSCULAR | Status: AC
  Filled 2021-09-12: qty 5

## 2021-09-12 MED ORDER — ROCURONIUM BROMIDE 10 MG/ML (PF) SYRINGE
PREFILLED_SYRINGE | INTRAVENOUS | Status: AC
  Filled 2021-09-12: qty 10

## 2021-09-12 MED ORDER — OXYCODONE HCL 5 MG PO TABS: 5.0000 mg | ORAL_TABLET | ORAL | 0 refills | Status: DC | PRN

## 2021-09-12 MED ORDER — 0.9 % SODIUM CHLORIDE (POUR BTL) OPTIME
TOPICAL | Status: DC | PRN
  Administered 2021-09-12: 1000 mL

## 2021-09-12 MED ORDER — SENNA 8.6 MG PO TABS: 2.0000 | ORAL_TABLET | Freq: Every day | ORAL | Status: DC

## 2021-09-12 MED ORDER — DOCUSATE SODIUM 100 MG PO CAPS: 100.0000 mg | ORAL_CAPSULE | Freq: Two times a day (BID) | ORAL | 1 refills | Status: DC | PRN

## 2021-09-12 MED ORDER — ACETAMINOPHEN 650 MG RE SUPP: 650.0000 mg | RECTAL | Status: DC | PRN

## 2021-09-12 MED ORDER — THROMBIN 20000 UNITS EX SOLR
CUTANEOUS | Status: AC
  Filled 2021-09-12: qty 20000

## 2021-09-12 MED ORDER — OXYCODONE HCL 5 MG PO TABS: 5.0000 mg | ORAL_TABLET | ORAL | Status: DC | PRN

## 2021-09-12 MED ORDER — ROCURONIUM BROMIDE 10 MG/ML (PF) SYRINGE
PREFILLED_SYRINGE | INTRAVENOUS | Status: DC | PRN
  Administered 2021-09-12: 50 mg via INTRAVENOUS

## 2021-09-12 MED ORDER — METHOCARBAMOL 500 MG PO TABS: 500.0000 mg | ORAL_TABLET | Freq: Four times a day (QID) | ORAL | Status: DC | PRN

## 2021-09-12 MED ORDER — ONDANSETRON HCL 4 MG/2ML IJ SOLN: 4.0000 mg | Freq: Four times a day (QID) | INTRAMUSCULAR | Status: DC | PRN

## 2021-09-12 MED ORDER — BUPIVACAINE-EPINEPHRINE 0.5% -1:200000 IJ SOLN
INTRAMUSCULAR | Status: AC
  Filled 2021-09-12: qty 1

## 2021-09-12 MED ORDER — MEPERIDINE HCL 25 MG/ML IJ SOLN: 6.2500 mg | INTRAMUSCULAR | Status: DC | PRN

## 2021-09-12 MED ORDER — AMISULPRIDE (ANTIEMETIC) 5 MG/2ML IV SOLN: 10.0000 mg | Freq: Once | INTRAVENOUS | Status: DC | PRN

## 2021-09-12 MED ORDER — BISACODYL 5 MG PO TBEC: 5.0000 mg | DELAYED_RELEASE_TABLET | Freq: Every day | ORAL | Status: DC | PRN

## 2021-09-12 MED ORDER — PHENYLEPHRINE HCL-NACL 20-0.9 MG/250ML-% IV SOLN
INTRAVENOUS | Status: DC | PRN
  Administered 2021-09-12: 20 ug/min via INTRAVENOUS

## 2021-09-12 MED ORDER — ACETAMINOPHEN 10 MG/ML IV SOLN: 1000.0000 mg | Freq: Once | INTRAVENOUS | Status: DC | PRN

## 2021-09-12 MED ORDER — LIDOCAINE 2% (20 MG/ML) 5 ML SYRINGE
INTRAMUSCULAR | Status: DC | PRN
Start: 2021-09-12 — End: 2021-09-12
  Administered 2021-09-12: 40 mg via INTRAVENOUS

## 2021-09-12 MED ORDER — MORPHINE SULFATE 15 MG PO TABS
15.0000 mg | ORAL_TABLET | Freq: Three times a day (TID) | ORAL | Status: DC | PRN
  Filled 2021-09-12: qty 1

## 2021-09-12 MED ORDER — ORAL CARE MOUTH RINSE: 15.0000 mL | Freq: Once | OROMUCOSAL | Status: AC

## 2021-09-12 MED ORDER — ACETAMINOPHEN 325 MG PO TABS: 650.0000 mg | ORAL_TABLET | ORAL | Status: DC | PRN

## 2021-09-12 SURGICAL SUPPLY — 65 items
BAG COUNTER SPONGE SURGICOUNT (BAG) ×3 IMPLANT
BAG DECANTER FOR FLEXI CONT (MISCELLANEOUS) IMPLANT
BAG SPNG CNTER NS LX DISP (BAG) ×1
BAND INSRT 18 STRL LF DISP RB (MISCELLANEOUS) ×2
BAND RUBBER #18 3X1/16 STRL (MISCELLANEOUS) ×6 IMPLANT
BUR RND DIAMOND ELITE 4.0 (BURR) IMPLANT
BUR STRYKR EGG 5.0 (BURR) IMPLANT
CARTRIDGE OIL MAESTRO DRILL (MISCELLANEOUS) IMPLANT
CLEANER TIP ELECTROSURG 2X2 (MISCELLANEOUS) ×3 IMPLANT
CNTNR URN SCR LID CUP LEK RST (MISCELLANEOUS) ×2 IMPLANT
CONT SPEC 4OZ STRL OR WHT (MISCELLANEOUS) ×2
DIFFUSER DRILL AIR PNEUMATIC (MISCELLANEOUS) IMPLANT
DRAPE LAPAROTOMY 100X72X124 (DRAPES) ×3 IMPLANT
DRAPE MICROSCOPE LEICA (MISCELLANEOUS) ×3 IMPLANT
DRAPE SHEET LG 3/4 BI-LAMINATE (DRAPES) ×3 IMPLANT
DRAPE SURG 17X11 SM STRL (DRAPES) ×3 IMPLANT
DRAPE UTILITY XL STRL (DRAPES) ×3 IMPLANT
DRESSING AQUACEL AG SP 3.5X6 (GAUZE/BANDAGES/DRESSINGS) IMPLANT
DRSG AQUACEL AG ADV 3.5X 4 (GAUZE/BANDAGES/DRESSINGS) IMPLANT
DRSG AQUACEL AG ADV 3.5X 6 (GAUZE/BANDAGES/DRESSINGS) IMPLANT
DRSG AQUACEL AG SP 3.5X6 (GAUZE/BANDAGES/DRESSINGS) ×2
DRSG TELFA 3X8 NADH (GAUZE/BANDAGES/DRESSINGS) IMPLANT
DURAPREP 26ML APPLICATOR (WOUND CARE) ×3 IMPLANT
DURASEAL SPINE SEALANT 3ML (MISCELLANEOUS) IMPLANT
ELECT BLADE 4.0 EZ CLEAN MEGAD (MISCELLANEOUS)
ELECT REM PT RETURN 9FT ADLT (ELECTROSURGICAL) ×2
ELECTRODE BLDE 4.0 EZ CLN MEGD (MISCELLANEOUS) IMPLANT
ELECTRODE REM PT RTRN 9FT ADLT (ELECTROSURGICAL) ×2 IMPLANT
GLOVE SURG POLYISO LF SZ7.5 (GLOVE) ×5 IMPLANT
GLOVE SURG POLYISO LF SZ8 (GLOVE) ×6 IMPLANT
GLOVE SURG UNDER POLY LF SZ7 (GLOVE) ×5 IMPLANT
GOWN STRL REUS W/ TWL LRG LVL3 (GOWN DISPOSABLE) ×2 IMPLANT
GOWN STRL REUS W/ TWL XL LVL3 (GOWN DISPOSABLE) ×2 IMPLANT
GOWN STRL REUS W/TWL LRG LVL3 (GOWN DISPOSABLE) ×6
GOWN STRL REUS W/TWL XL LVL3 (GOWN DISPOSABLE) ×2
IV CATH 14GX2 1/4 (CATHETERS) ×3 IMPLANT
KIT BASIN OR (CUSTOM PROCEDURE TRAY) ×3 IMPLANT
KIT POSITION SURG JACKSON T1 (MISCELLANEOUS) IMPLANT
NDL SPNL 18GX3.5 QUINCKE PK (NEEDLE) ×4 IMPLANT
NEEDLE 22X1 1/2 (OR ONLY) (NEEDLE) ×3 IMPLANT
NEEDLE SPNL 18GX3.5 QUINCKE PK (NEEDLE) ×4 IMPLANT
OIL CARTRIDGE MAESTRO DRILL (MISCELLANEOUS)
PACK LAMINECTOMY NEURO (CUSTOM PROCEDURE TRAY) ×3 IMPLANT
PACK UNIVERSAL I (CUSTOM PROCEDURE TRAY) IMPLANT
PAD DRESSING TELFA 3X8 NADH (GAUZE/BANDAGES/DRESSINGS) IMPLANT
PATTIES SURGICAL .75X.75 (GAUZE/BANDAGES/DRESSINGS) ×3 IMPLANT
SPONGE SURGIFOAM ABS GEL 100 (HEMOSTASIS) ×3 IMPLANT
SPONGE T-LAP 4X18 ~~LOC~~+RFID (SPONGE) ×1 IMPLANT
STAPLER VISISTAT (STAPLE) ×1 IMPLANT
STRIP CLOSURE SKIN 1/2X4 (GAUZE/BANDAGES/DRESSINGS) ×2 IMPLANT
SUT NURALON 4 0 TR CR/8 (SUTURE) IMPLANT
SUT PROLENE 3 0 PS 2 (SUTURE) IMPLANT
SUT VIC AB 1 CT1 27 (SUTURE)
SUT VIC AB 1 CT1 27XBRD ANTBC (SUTURE) IMPLANT
SUT VIC AB 1 CT1 36 (SUTURE) ×2 IMPLANT
SUT VIC AB 1-0 CT2 27 (SUTURE) IMPLANT
SUT VIC AB 2-0 CT1 27 (SUTURE) ×4
SUT VIC AB 2-0 CT1 TAPERPNT 27 (SUTURE) IMPLANT
SUT VIC AB 2-0 CT2 27 (SUTURE) IMPLANT
SYR 3ML LL SCALE MARK (SYRINGE) ×3 IMPLANT
TOWEL GREEN STERILE (TOWEL DISPOSABLE) ×3 IMPLANT
TOWEL GREEN STERILE FF (TOWEL DISPOSABLE) ×3 IMPLANT
TRAY FOLEY MTR SLVR 16FR STAT (SET/KITS/TRAYS/PACK) ×3 IMPLANT
WIPE CHG CHLORHEXIDINE 2% (PERSONAL CARE ITEMS) ×3 IMPLANT
YANKAUER SUCT BULB TIP NO VENT (SUCTIONS) ×3 IMPLANT

## 2021-09-12 NOTE — Evaluation (Signed)
Occupational Therapy Evaluation Patient Details Name: Ebony [name] MRN: [id number] DOB: [birth year] Today's Date: 09/12/2021   History of Present Illness 81 y/o female admitted on 09/12/21 following microlumbar decompression L3-5 with foraminotomies. PMH: HTN, basal cell cancer, TIA, osteopenia, heart murmur.   Clinical Impression   PTA, pt was living with her grandson and was independent. Currently, pt performing ADLs and functional mobility at Mod I level with increase time as needed. Provided education and handout on back precautions, bed mobility, grooming, LB ADLs, toileting, and shower transfer; pt demonstrated understanding. Answered all pt questions. Recommend dc home once medically stable per physician. All acute OT needs met and will sign off. Thank you.      Recommendations for follow up therapy are one component of a multi-disciplinary discharge planning process, led by the attending physician.  Recommendations may be updated based on patient status, additional functional criteria and insurance authorization.   Follow Up Recommendations  No OT follow up    Assistance Recommended at Discharge PRN  Patient can return home with the following      Functional Status Assessment     Equipment Recommendations  None recommended by OT    Recommendations for Other Services       Precautions / Restrictions Precautions Precautions: Back Precaution Booklet Issued: Yes (comment) Precaution Comments: reviewed back precaution Required Braces or Orthoses: Other Brace Other Brace: No brace per MD Restrictions Weight Bearing Restrictions: No      Mobility Bed Mobility Overal bed mobility: Modified Independent             General bed mobility comments: using log roll    Transfers Overall transfer level: Modified independent Equipment used: None                      Balance Overall balance assessment: Mild deficits observed, not formally tested                                          ADL either performed or assessed with clinical judgement   ADL Overall ADL's : Modified independent                                       General ADL Comments: Providing education and handout for back precautions, grooming, bed mobility, LB ADLs, toielting, and shower transfer. Pt performing at Mod I level with increased time     Vision Baseline Vision/History: 1 Wears glasses       Perception     Praxis      Pertinent Vitals/Pain Pain Assessment Pain Assessment: Faces Faces Pain Scale: Hurts a little bit Pain Location: back Pain Descriptors / Indicators: Discomfort, Grimacing Pain Intervention(s): Monitored during session, Limited activity within patient's tolerance, Repositioned     Hand Dominance Right   Extremity/Trunk Assessment Upper Extremity Assessment Upper Extremity Assessment: Overall WFL for tasks assessed   Lower Extremity Assessment Lower Extremity Assessment: Overall WFL for tasks assessed   Cervical / Trunk Assessment Cervical / Trunk Assessment: Back Surgery   Communication Communication Communication: No difficulties   Cognition Arousal/Alertness: Awake/alert Behavior During Therapy: WFL for tasks assessed/performed Overall Cognitive Status: Within Functional Limits for tasks assessed  General Comments  Daughter, Anda Kraft, present throughout    Exercises     Shoulder Instructions      Home Living Family/patient expects to be discharged to:: Private residence Living Arrangements: Other relatives;Non-relatives/Friends Available Help at Discharge: Family Type of Home: House Home Access: Level entry     Home Layout: One level     Bathroom Shower/Tub: Occupational psychologist: Handicapped height     Home Equipment: Rollator (4 wheels);Rolling Walker (2 wheels)          Prior Functioning/Environment Prior Level of  Function : Independent/Modified Independent             Mobility Comments: using rollator at times due to pain ADLs Comments: Was having difficulty with LB ADLs due to LLE pain; independent but slow        OT Problem List: Decreased strength;Decreased range of motion;Decreased activity tolerance;Impaired balance (sitting and/or standing);Decreased knowledge of use of DME or AE;Decreased knowledge of precautions      OT Treatment/Interventions:      OT Goals(Current goals can be found in the care plan section) Acute Rehab OT Goals Patient Stated Goal: Go home OT Goal Formulation: All assessment and education complete, DC therapy  OT Frequency:      Co-evaluation              AM-PAC OT "6 Clicks" Daily Activity     Outcome Measure Help from another person eating meals?: None Help from another person taking care of personal grooming?: None Help from another person toileting, which includes using toliet, bedpan, or urinal?: None Help from another person bathing (including washing, rinsing, drying)?: None Help from another person to put on and taking off regular upper body clothing?: None Help from another person to put on and taking off regular lower body clothing?: None 6 Click Score: 24   End of Session Nurse Communication: Mobility status  Activity Tolerance: Patient tolerated treatment well Patient left: Other (comment);with family/visitor present;with call bell/phone within reach (in bathroom)  OT Visit Diagnosis: Unsteadiness on feet (R26.81);Other abnormalities of gait and mobility (R26.89);Muscle weakness (generalized) (M62.81)                Time: 1443-1540 OT Time Calculation (min): 19 min Charges:  OT General Charges $OT Visit: 1 Visit OT Evaluation $OT Eval Low Complexity: 1 Low  Daouda Lonzo MSOT, OTR/L Acute Rehab Pager: [phone] Office: [place] 09/12/2021, 4:02 PM

## 2021-09-12 NOTE — Anesthesia Postprocedure Evaluation (Signed)
Anesthesia Post Note  Patient: Ebony [name]  Procedure(s) Performed: Lumbar decompression Lumbar three - Lumbar four and lumbar four-five (Spine Lumbar)     Patient location during evaluation: PACU Anesthesia Type: General Level of consciousness: awake and alert Pain management: pain level controlled Vital Signs Assessment: post-procedure vital signs reviewed and stable Respiratory status: spontaneous breathing, nonlabored ventilation, respiratory function stable and patient connected to nasal cannula oxygen Cardiovascular status: blood pressure returned to baseline and stable Postop Assessment: no apparent nausea or vomiting Anesthetic complications: no   No notable events documented.  Last Vitals:  Vitals:   09/12/21 1040 09/12/21 1053  BP: 123/66 (!) 144/75  Pulse: 66 60  Resp:  20  Temp:  36.6 C  SpO2: 97% 98%    Last Pain:  Vitals:   09/12/21 1015  TempSrc:   PainSc: 3                  Effie Berkshire

## 2021-09-12 NOTE — Evaluation (Signed)
Physical Therapy Evaluation & Discharge Patient Details Name: Ebony [name] MRN: [id number] DOB: [birth year] Today's Date: 09/12/2021  History of Present Illness  81 y/o female admitted on 09/12/21 following microlumbar decompression L3-5 with foraminotomies. PMH: HTN, basal cell cancer, TIA, osteopenia, heart murmur.  Clinical Impression  Patient admitted following above procedure. Patient functioning at modI level for mobility with no AD. Educated patient and daughter on back precautions and progressive walking program, both verbalized understanding. Provided back precautions handout for review. Patient reports L LE pain relief since surgery but demos mild LLE weakness with mobility. No further skilled PT needs identified acutely. No PT follow up recommended at this time. PT will sign off.        Recommendations for follow up therapy are one component of a multi-disciplinary discharge planning process, led by the attending physician.  Recommendations may be updated based on patient status, additional functional criteria and insurance authorization.  Follow Up Recommendations No PT follow up    Assistance Recommended at Discharge PRN  Patient can return home with the following       Equipment Recommendations None recommended by PT  Recommendations for Other Services       Functional Status Assessment Patient has not had a recent decline in their functional status     Precautions / Restrictions Precautions Precautions: Back Precaution Booklet Issued: Yes (comment) Restrictions Weight Bearing Restrictions: No      Mobility  Bed Mobility Overal bed mobility: Modified Independent                  Transfers Overall transfer level: Modified independent Equipment used: None                    Ambulation/Gait Ambulation/Gait assistance: Modified independent (Device/Increase time) Gait Distance (Feet): 400 Feet Assistive device: None Gait Pattern/deviations:  Decreased stance time - left, Decreased stride length, Step-through pattern Gait velocity: decreased     General Gait Details: decreased stance time on L LE  Stairs            Wheelchair Mobility    Modified Rankin (Stroke Patients Only)       Balance Overall balance assessment: Mild deficits observed, not formally tested                                           Pertinent Vitals/Pain Pain Assessment Pain Assessment: Faces Faces Pain Scale: Hurts a little bit Pain Location: back Pain Descriptors / Indicators: Discomfort, Grimacing Pain Intervention(s): Monitored during session    Home Living Family/patient expects to be discharged to:: Private residence Living Arrangements: Other relatives;Non-relatives/Friends Available Help at Discharge: Family Type of Home: House Home Access: Level entry       Home Layout: One level Home Equipment: Rollator (4 wheels);Rolling Ebony [name] (2 wheels)      Prior Function Prior Level of Function : Independent/Modified Independent             Mobility Comments: using rollator at times due to pain       Hand Dominance        Extremity/Trunk Assessment   Upper Extremity Assessment Upper Extremity Assessment: Overall WFL for tasks assessed    Lower Extremity Assessment Lower Extremity Assessment: Overall WFL for tasks assessed (mild functional LLE weakness)    Cervical / Trunk Assessment Cervical / Trunk Assessment: Back Surgery  Communication  Communication: No difficulties  Cognition Arousal/Alertness: Awake/alert Behavior During Therapy: WFL for tasks assessed/performed Overall Cognitive Status: Within Functional Limits for tasks assessed                                          General Comments      Exercises     Assessment/Plan    PT Assessment Patient does not need any further PT services  PT Problem List         PT Treatment Interventions      PT Goals  (Current goals can be found in the Care Plan section)  Acute Rehab PT Goals Patient Stated Goal: to go home PT Goal Formulation: All assessment and education complete, DC therapy    Frequency       Co-evaluation               AM-PAC PT "6 Clicks" Mobility  Outcome Measure Help needed turning from your back to your side while in a flat bed without using bedrails?: None Help needed moving from lying on your back to sitting on the side of a flat bed without using bedrails?: None Help needed moving to and from a bed to a chair (including a wheelchair)?: None Help needed standing up from a chair using your arms (e.g., wheelchair or bedside chair)?: None Help needed to walk in hospital room?: None Help needed climbing 3-5 steps with a railing? : None 6 Click Score: 24    End of Session   Activity Tolerance: Patient tolerated treatment well Patient left: in bed;with call bell/phone within reach;with family/visitor present Nurse Communication: Mobility status PT Visit Diagnosis: Muscle weakness (generalized) (M62.81)    Time: 5035-4656 PT Time Calculation (min) (ACUTE ONLY): 12 min   Charges:   PT Evaluation $PT Eval Low Complexity: 1 Low          Ebony [name] A. Ebony [name] PT, DPT Acute Rehabilitation Services Pager [phone] Office [phone]   Ebony [name] 09/12/2021, 2:20 PM

## 2021-09-12 NOTE — Op Note (Signed)
NAME: Ebony [name], Ebony [name] MEDICAL RECORD NO: [id number] ACCOUNT NO: [id number] DATE OF BIRTH: 07/29/1940 FACILITY: [facility] LOCATION: [facility] PHYSICIAN: Johnn Hai, MD  Operative Report   DATE OF PROCEDURE: 09/12/2021  PREOPERATIVE DIAGNOSIS:  Spinal stenosis, L3-L4, L4-L5.  POSTOPERATIVE DIAGNOSIS:  Spinal stenosis, L3-L4, L4-L5.  PROCEDURE PERFORMED:  Microlumbar decompression L3-L4, L4-L5 with foraminotomies of L3, L4 and L5 on the left.  ANESTHESIA:  General.  ASSISTANT:  Lacie Draft, PA.  HISTORY:  A 81 year old female with predominantly left lower extremity radicular pain, L3, L4 and L5 nerve root distribution.  She has spinal stenosis predominantly at L3-L4 and L4-L5, lateral recess at L3-L4 and L4-L5.  Failing conservative treatment,  she was indicated for microlumbar decompression.  Risks and benefits discussed including bleeding, infection, damage to neurovascular structures, no change in symptoms, worsening symptoms, DVT, PE, anesthetic complications, etc.  DESCRIPTION OF PROCEDURE:  The patient in the supine position, after induction of adequate general anesthesia, 2 grams of Kefzol, she was placed prone on the Wilson frame.  Bony prominences were well padded.  Foley to gravity.  Lumbar region was prepped  and draped in the usual sterile fashion.  Two 18-gauge spinal needle was utilized to localize the L4-L5 and 3-point interspace.  This was confirmed with x-ray.  An incision was made from above 3 to below the spinous process of 5.  Subcutaneous tissue was  dissected.  Electrocautery was utilized to achieve hemostasis.  Dorsal lumbar fascia divided in line with skin incision.  Paraspinous muscles elevated from lamina of L3-L4 and L4-L5.  McCulloch retractor was placed.  Confirmatory radiograph obtained,  identifying the spinous processes of 3 and 4.  Operating microscope was draped and brought in the surgical field.  Leksell rongeur was utilized to remove the spinous processes of  3 and 4.  Also performed hemilaminotomies of the caudad edge of 3  bilaterally and 4.  I then starting at L3-L4, I used a 3 mm Kerrison to perform hemilaminotomies bilaterally at the caudad edge of 3 to the point detaching the ligamentum flavum from its cephalad edge, using a straight curette to detach ligamentum flavum  from the cephalad edge of 4.  I removed the ligamentum from the interspace.  I removed a portion of the medial aspect of the inferior process of 3, exposing the superior articulating process of 4.  I then used a Penfield to develop a plane between the  ligamentum flavum and the L4 nerve root.  Following this, I performed a generous foraminotomy of 4.  There is stenosis noted in the lateral recess.  Hypertrophic ligamentum flavum was removed.  I then gently mobilized the 4 root medially, I decompressed  the lateral recess to the medial border of the pedicle.  I performed a foraminotomy at 3, preserving the L3 nerve root.  Disk was palpated and it was a hard disk.  Following the decompression, a Woodson probe passed freely out the foramen at 3 and of 4  and lateral recess was well decompressed.  I removed ligamentum flavum from the interspace and detached it from the right side inferior edge of 3.  Copiously irrigated with irrigation.  Bipolar cautery was utilized to achieve hemostasis.  Bone wax was  placed on the cancellous surfaces and thrombin-soaked Gelfoam was placed in the laminotomy defect.  Attention was then turned to L4-L5.  In a similar fashion, I performed hemilaminotomies of the caudad edge of 4 with a 3 mm Kerrison to the point detaching the ligamentum flavum.  Straight curette detaching the ligamentum flavum from the cephalad edge of  5.  Ligamentum flavum was removed from the interspace.  Hypertrophic facet was noted.  In a similar fashion, I developed a plane between the ligamentum flavum and the lamina.  I performed a foraminotomy of L5.  I identified the 5 root, gently  mobilized  medially, decompressed the lateral recess and medial border of the pedicle, protected the 4 root and performed a foraminotomy of 4.  There was stenosis, both of 4 and 5 and particularly in the lateral recess with hypertrophic ligamentum flavum.   Ligamentum flavum was also removed from the interspace centrally and off to the right detached the ligamentum flavum from the caudad edge of 4.  Copiously irrigated the wound.  Bone wax was placed on the cancellous surfaces.  Woodson probe passed freely  out the foramen of 4 and of 5.  There was no disk herniation noted.  No evidence of active bleeding or CSF leakage was noted.  I placed thrombin-soaked Gelfoam in the laminotomy defect.  Obtained a confirmatory radiograph with Woodson's in the foramen of  3 and of 5.  Following this, I removed the thrombin-soaked Gelfoam.  I copiously irrigated with antibiotic irrigation.  No active bleeding or CSF leakage.  I then removed the McCulloch retractor and used bipolar cautery to achieve strict hemostasis.  I  copiously irrigated with antibiotic irrigation.  Next, I then closed the dorsal lumbar fascia with #1 Vicryl interrupted figure-of-eight suture with a small aperture of centimeter distally to allow for any drainage, subcutaneous with 2-0 and skin with  staples.  Again, allowed for drainage.  Sterile dressing applied.   Placed supine on the hospital bed, extubated without difficulty and transported to the recovery room in satisfactory condition.  The patient tolerated the procedure well.  No complications.  Assistant Lacie Draft, [place].  Blood loss was 50 mL.   CHR D: 09/12/2021 9:42:12 am T: 09/12/2021 12:31:00 pm  JOB: [id number]/ [id number]

## 2021-09-12 NOTE — Discharge Instructions (Signed)
Walk As Tolerated utilizing back precautions.  No bending, twisting, or lifting.  No driving for 2 weeks.   Aquacel dressing may remain in place until follow up. May shower with aquacel dressing in place. If the dressing peels off or becomes saturated, you may remove aquacel dressing and place gauze and tape dressing which should be kept clean and dry and changed daily. Do not remove steri-strips if they are present. See Dr. Tonita Cong in office in 10 to 14 days. Resume taking plavix on post-op day 5. Walk daily even outside. Use a cane or walker only if necessary. Avoid sitting on soft sofas.

## 2021-09-12 NOTE — Transfer of Care (Signed)
Immediate Anesthesia Transfer of Care Note  Patient: Ebony [name]  Procedure(s) Performed: Lumbar decompression Lumbar three - Lumbar four and lumbar four-five (Spine Lumbar)  Patient Location: PACU  Anesthesia Type:General  Level of Consciousness: drowsy and patient cooperative  Airway & Oxygen Therapy: Patient Spontanous Breathing  Post-op Assessment: Report given to RN, Post -op Vital signs reviewed and stable and Patient moving all extremities  Post vital signs: Reviewed and stable  Last Vitals:  Vitals Value Taken Time  BP 105/71 09/12/21 0947  Temp    Pulse 66 09/12/21 0949  Resp 16 09/12/21 0949  SpO2 95 % 09/12/21 0949  Vitals shown include unvalidated device data.  Last Pain:  Vitals:   09/12/21 0600  TempSrc: Oral  PainSc:       Patients Stated Pain Goal: 2 (74/73/40 3709)  Complications: No notable events documented.

## 2021-09-12 NOTE — Plan of Care (Signed)
Adequately Ready for discharge

## 2021-09-12 NOTE — Interval H&P Note (Signed)
History and Physical Interval Note:  09/12/2021 7:16 AM  Colleen Can  has presented today for surgery, with the diagnosis of Spinal stenosis L3-4, L4-5.  The various methods of treatment have been discussed with the patient and family. After consideration of risks, benefits and other options for treatment, the patient has consented to  Procedure(s) with comments: Lumbar decompression L3-4 possible L4-5 (N/A) - 2 hrs 3 C-Bed as a surgical intervention.  The patient's history has been reviewed, patient examined, no change in status, stable for surgery.  I have reviewed the patient's chart and labs.  Questions were answered to the patient's satisfaction.     Johnn Hai  Patient with pain in the L3-L4 and L5 nerve root distribution today.  Radiates down to the lateral aspect of the calf.  Able to perform an active straight leg raise.  Slight quad weakness is noted slight hip flexor weakness is noted. Plan decompression at 2 levels L3-4 L4-5.  L4-5 predominantly on the left

## 2021-09-12 NOTE — Brief Op Note (Signed)
09/12/2021  7:24 AM  PATIENT:  Ebony [name]  81 y.o. female  PRE-OPERATIVE DIAGNOSIS:  Spinal stenosis L3-4, L4-5  POST-OPERATIVE DIAGNOSIS:  * No post-op diagnosis entered *  PROCEDURE:  Procedure(s) with comments: Lumbar decompression L3-4 possible L4-5 (N/A) - 2 hrs 3 C-Bed  SURGEON:  Surgeon(s) and Role:    Susa Day, MD - Primary  PHYSICIAN ASSISTANT:   ASSISTANTS: Bissell   ANESTHESIA:   general  EBL:  50c   BLOOD ADMINISTERED:none  DRAINS: none   LOCAL MEDICATIONS USED:  MARCAINE     SPECIMEN:  No Specimen  DISPOSITION OF SPECIMEN:  N/A  COUNTS:  YES  TOURNIQUET:  * No tourniquets in log *  DICTATION: .Other Dictation: Dictation Number [id number]  PLAN OF CARE: Admit for overnight observation  PATIENT DISPOSITION:  PACU - hemodynamically stable.   Delay start of Pharmacological VTE agent (>24hrs) due to surgical blood loss or risk of bleeding: yes

## 2021-09-12 NOTE — TOC Progression Note (Signed)
Transition of Care [facility]) - Progression Note    Patient Details  Name: Ebony [name] MRN: [id number] Date of Birth: 05/27/1941  Transition of Care [facility]) CM/SW Contact  Jacalyn Lefevre Edson Snowball, RN Phone Number: 09/12/2021, 2:44 PM  Clinical Narrative:      No PT follow up, no recommended DME.    Transition of Care [facility]) Screening Note   Patient Details  Name: Ebony [name] Date of Birth: 1940-11-07     Transition of Care Department [facility] has reviewed patient and no TOC needs have been identified at this time. We will continue to monitor patient advancement through interdisciplinary progression rounds. If new patient transition needs arise, please place a TOC consult.        Expected Discharge Plan and Services                                                 Social Determinants of Health (SDOH) Interventions    Readmission Risk Interventions No flowsheet data found.

## 2021-09-12 NOTE — Anesthesia Procedure Notes (Signed)
Procedure Name: Intubation Date/Time: 09/12/2021 7:39 AM Performed by: Amadeo Garnet, CRNA Pre-anesthesia Checklist: Patient identified, Emergency Drugs available, Suction available and Patient being monitored Patient Re-evaluated:Patient Re-evaluated prior to induction Oxygen Delivery Method: Circle system utilized Preoxygenation: Pre-oxygenation with 100% oxygen Induction Type: IV induction Ventilation: Mask ventilation without difficulty Laryngoscope Size: Mac and 4 Grade View: Grade III Tube type: Oral Tube size: 7.0 mm Number of attempts: 1 Airway Equipment and Method: Stylet and Oral airway Placement Confirmation: ETT inserted through vocal cords under direct vision, positive ETCO2 and breath sounds checked- equal and bilateral Secured at: 22 cm Tube secured with: Tape Dental Injury: Teeth and Oropharynx as per pre-operative assessment

## 2021-09-12 NOTE — Progress Notes (Signed)
Patient alert and oriented, mae's well, voiding adequate amount of urine, swallowing without difficulty, no c/o pain at time of discharge. Patient discharged home with family. Script and discharged instructions given to patient. Patient and daughter stated understanding of instructions given. Patient has an appointment with Dr. Tonita Cong.

## 2021-09-13 ENCOUNTER — Encounter (HOSPITAL_COMMUNITY): Payer: Self-pay | Admitting: Specialist

## 2021-09-24 ENCOUNTER — Other Ambulatory Visit: Payer: Medicare PPO | Admitting: Surgical

## 2021-09-27 ENCOUNTER — Institutional Professional Consult (permissible substitution): Payer: Medicare PPO | Admitting: Plastic Surgery

## 2021-10-08 ENCOUNTER — Emergency Department (HOSPITAL_BASED_OUTPATIENT_CLINIC_OR_DEPARTMENT_OTHER): Payer: Medicare PPO

## 2021-10-08 ENCOUNTER — Observation Stay (HOSPITAL_BASED_OUTPATIENT_CLINIC_OR_DEPARTMENT_OTHER)
Admission: EM | Admit: 2021-10-08 | Discharge: 2021-10-09 | Disposition: A | Discharge status: Home / Self Care | Payer: Medicare PPO | Attending: Family Medicine | Admitting: Family Medicine

## 2021-10-08 ENCOUNTER — Other Ambulatory Visit: Payer: Self-pay

## 2021-10-08 DIAGNOSIS — E785 Hyperlipidemia, unspecified: Secondary | ICD-10-CM | POA: Diagnosis present

## 2021-10-08 DIAGNOSIS — R2990 Unspecified symptoms and signs involving the nervous system: Principal | ICD-10-CM

## 2021-10-08 DIAGNOSIS — I639 Cerebral infarction, unspecified: Secondary | ICD-10-CM | POA: Diagnosis not present

## 2021-10-08 DIAGNOSIS — Z79899 Other long term (current) drug therapy: Secondary | ICD-10-CM | POA: Insufficient documentation

## 2021-10-08 DIAGNOSIS — I129 Hypertensive chronic kidney disease with stage 1 through stage 4 chronic kidney disease, or unspecified chronic kidney disease: Secondary | ICD-10-CM | POA: Insufficient documentation

## 2021-10-08 DIAGNOSIS — I63411 Cerebral infarction due to embolism of right middle cerebral artery: Secondary | ICD-10-CM

## 2021-10-08 DIAGNOSIS — R531 Weakness: Secondary | ICD-10-CM | POA: Diagnosis present

## 2021-10-08 DIAGNOSIS — Z96651 Presence of right artificial knee joint: Secondary | ICD-10-CM | POA: Insufficient documentation

## 2021-10-08 DIAGNOSIS — N1831 Chronic kidney disease, stage 3a: Secondary | ICD-10-CM | POA: Diagnosis not present

## 2021-10-08 DIAGNOSIS — K219 Gastro-esophageal reflux disease without esophagitis: Secondary | ICD-10-CM | POA: Diagnosis present

## 2021-10-08 DIAGNOSIS — Z87891 Personal history of nicotine dependence: Secondary | ICD-10-CM | POA: Diagnosis not present

## 2021-10-08 DIAGNOSIS — R29818 Other symptoms and signs involving the nervous system: Secondary | ICD-10-CM | POA: Diagnosis not present

## 2021-10-08 DIAGNOSIS — I10 Essential (primary) hypertension: Secondary | ICD-10-CM | POA: Diagnosis present

## 2021-10-08 DIAGNOSIS — G459 Transient cerebral ischemic attack, unspecified: Secondary | ICD-10-CM | POA: Diagnosis not present

## 2021-10-08 LAB — CBC
HCT: 40.4 % (ref 36.0–46.0)
Hemoglobin: 13.5 g/dL (ref 12.0–15.0)
MCH: 33.6 pg (ref 26.0–34.0)
MCHC: 33.4 g/dL (ref 30.0–36.0)
MCV: 100.5 fL — ABNORMAL HIGH (ref 80.0–100.0)
Platelets: 224 10*3/uL (ref 150–400)
RBC: 4.02 MIL/uL (ref 3.87–5.11)
RDW: 12.8 % (ref 11.5–15.5)
WBC: 8.4 10*3/uL (ref 4.0–10.5)
nRBC: 0 % (ref 0.0–0.2)

## 2021-10-08 LAB — CBG MONITORING, ED: Glucose-Capillary: 131 mg/dL — ABNORMAL HIGH (ref 70–99)

## 2021-10-08 LAB — DIFFERENTIAL
Abs Immature Granulocytes: 0.04 10*3/uL (ref 0.00–0.07)
Basophils Absolute: 0 10*3/uL (ref 0.0–0.1)
Basophils Relative: 1 %
Eosinophils Absolute: 0.3 10*3/uL (ref 0.0–0.5)
Eosinophils Relative: 4 %
Immature Granulocytes: 1 %
Lymphocytes Relative: 24 %
Lymphs Abs: 2.1 10*3/uL (ref 0.7–4.0)
Monocytes Absolute: 1.2 10*3/uL — ABNORMAL HIGH (ref 0.1–1.0)
Monocytes Relative: 14 %
Neutro Abs: 4.8 10*3/uL (ref 1.7–7.7)
Neutrophils Relative %: 56 %

## 2021-10-08 LAB — COMPREHENSIVE METABOLIC PANEL
ALT: 13 U/L (ref 0–44)
AST: 17 U/L (ref 15–41)
Albumin: 4.3 g/dL (ref 3.5–5.0)
Alkaline Phosphatase: 51 U/L (ref 38–126)
Anion gap: 15 (ref 5–15)
BUN: 32 mg/dL — ABNORMAL HIGH (ref 8–23)
CO2: 22 mmol/L (ref 22–32)
Calcium: 9.5 mg/dL (ref 8.9–10.3)
Chloride: 103 mmol/L (ref 98–111)
Creatinine, Ser: 1.39 mg/dL — ABNORMAL HIGH (ref 0.44–1.00)
GFR, Estimated: 38 mL/min — ABNORMAL LOW (ref >60)
Glucose, Bld: 130 mg/dL — ABNORMAL HIGH (ref 70–99)
Potassium: 3.5 mmol/L (ref 3.5–5.1)
Sodium: 140 mmol/L (ref 135–145)
Total Bilirubin: 0.3 mg/dL (ref 0.3–1.2)
Total Protein: 7.1 g/dL (ref 6.5–8.1)

## 2021-10-08 LAB — PROTIME-INR
INR: 0.9 (ref 0.8–1.2)
Prothrombin Time: 12.2 seconds (ref 11.4–15.2)

## 2021-10-08 LAB — APTT: aPTT: 32 seconds (ref 24–36)

## 2021-10-08 MED ORDER — SODIUM CHLORIDE 0.9% FLUSH
3.0000 mL | Freq: Once | INTRAVENOUS | Status: DC
  Filled 2021-10-08: qty 3

## 2021-10-08 MED ORDER — IOHEXOL 350 MG/ML SOLN
100.0000 mL | Freq: Once | INTRAVENOUS | Status: AC | PRN
  Administered 2021-10-08: 60 mL via INTRAVENOUS

## 2021-10-08 NOTE — Consult Note (Signed)
[facility] ?[facility] Tele[facility]urology Consult [facility] ? ?Stat Consult ? ?Patient Name:   Ebony [name], Ebony [name] ?Date of Birth:   07/16/1941 ?Identification Number:   MRN - [id number] ?Date of Service:   10/08/2021 20:34:35 ? ?Diagnosis: ?      R27.0 - Ataxia (unsp) ? ?Impression ?68F w/PMH of TIA p/w ataxia in LUE, NIHSS 1, CTH/CTA wo acute changes or LVO. Would recommend admission for stroke work up. ? ?Our recommendations are outlined below. ? ?Diagnostic Studies: ?MRI Head without contrast ?Please order ?Holter/Loop Recorder as outpatient with cardiology follow up ?Please order ? ?Laboratory Studies: ?Recommend Lipid panel ?Please order ?Hemoglobin A1c ?Please order ? ?Antithrombotic Medications: ?Bolus with Clopidogrel 300 mg bolus x1 and initiate dual antiplatelet therapy with Aspirin 81 mg daily and Clopidogrel 75 mg daily ?Please order ? ?Nursing Recommendations: ?Telemetry, IV Fluids, avoid dextrose containing fluids, Maintain euglycemia ?Neuro checks q4 hrs x 24 hrs and then per shift ?Head of bed 30 degrees ? ?Consultations: ?Recommend Speech therapy if failed dysphagia screen ?Physical therapy/Occupational therapy ? ?DVT Prophylaxis: ?Choice of Primary Team ? ?Disposition: ?Neurology will follow ? ? ? ?---------------------------------------------------------------------------------------------------- ? ?Metrics: ?[facility] Notification Time: 10/08/2021 20:32:34 ?Stamp Time: 10/08/2021 20:34:35 ?Callback Response Time: 10/08/2021 20:35:08 ? ? ? ? ?---------------------------------------------------------------------------------------------------- ? ?Chief Complaint: ?dropping things from the left hand ? ?History of Present Illness: ?Patient is a 81 year old Female. ?This is a 81 year old female patient with PMH of TIA on plavix, HTN, and HLD, not on any blood thinners, that presents OOW for issues with her left hand. Patient states that starting at 10am, she's had issues with the doing things  with the left hand, like grabbing things or pushing buttons. She's been dropping things from that hand too. She denies slurred speech, HA, weakness, numbness/tingling, and vision changes. ?  ? ?Past Medical History: ?     Hypertension ?     Hyperlipidemia ?     There is no history of Diabetes Mellitus ?     There is no history of Stroke ?Other PMH:  TIA ? ?Medications: ? ?No Anticoagulant use  ?No Antiplatelet use ?Reviewed EMR for current medications ? ?Allergies:  ?Reviewed ? ?Social History: ?Smoking: Former ?Alcohol Use: Yes ?Drug Use: No ? ?Family History: ? ?There is no family history of premature cerebrovascular disease pertinent to this consultation ? ?ROS : ?14 Points Review of Systems was performed and was negative except mentioned in HPI. ? ?Past Surgical History: ?There Is No Surgical History Contributory To Today?s Visit ? ?  ?Examination: ?BP(131/64), Pulse(71), ?1A: Level of Consciousness - Alert; keenly responsive + 0 ?1B: Ask Month and Age - Both Questions Right + 0 ?1C: Blink Eyes & Squeeze Hands - Performs Both Tasks + 0 ?2: Test Horizontal Extraocular Movements - Normal + 0 ?3: Test Visual Fields - No Visual Loss + 0 ?4: Test Facial Palsy (Use Grimace if Obtunded) - Normal symmetry + 0 ?5A: Test Left Arm Motor Drift - No Drift for 10 Seconds + 0 ?5B: Test Right Arm Motor Drift - No Drift for 10 Seconds + 0 ?6A: Test Left Leg Motor Drift - No Drift for 5 Seconds + 0 ?6B: Test Right Leg Motor Drift - No Drift for 5 Seconds + 0 ?7: Test Limb Ataxia (FNF/Heel-Shin) - Ataxia in 1 Limb + 1 ?8: Test Sensation - Normal; No sensory loss + 0 ?9: Test Language/Aphasia - Normal; No aphasia + 0 ?10: Test Dysarthria - Normal + 0 ?11: Test Extinction/Inattention - No abnormality +  0 ? ?NIHSS Score: 1 ? ? ? ? ?Patient / Family was informed the Neurology Consult would occur via TeleHealth consult by way of interactive audio and video telecommunications and consented to receiving care in this manner. ? ?Patient is  being evaluated for possible acute neurologic impairment and high probability of imminent or life - threatening deterioration.I spent total of 35 minutes providing care to this patient, including time for face to face visit via telemedicine, review of medical records, imaging studies and discussion of findings with providers, the patient and / or family. ? ? ?Dr Luetta Nutting ? ? ?[facility] ?[phone] ? ?Case [id number] ?

## 2021-10-08 NOTE — ED Triage Notes (Signed)
Patient arrives with complaints of left arm weakness and irregular sensation that started this morning around 1000. She states that she has been dropping objects from her left hand most of the day. Thinks she may have had a TIA. Had one previously 20 years ago.  ?Accompanied by friend.   ? ? ?Patient also reports back surgery last month.  ?

## 2021-10-08 NOTE — ED Provider Notes (Signed)
? ?Emergency Department Provider Note ? ? ?I have reviewed the triage vital signs and the nursing notes. ? ? ?HISTORY ? ?Chief Complaint ?Weakness and Extremity Weakness (Left Arm ) ? ? ?HPI ?Ebony [name] is a 81 y.o. female with past medical history reviewed below including remote history of TIA presents to the emergency department with difficulty coordinating and moving her left upper extremity.  Symptoms began at 10 AM this morning.  She woke up today feeling normal.  She is noticed that since around 10 AM she has been dropping things like cell phones or spilling water on herself when trying to drink.  She feels like it is difficult to coordinate movements with her left hand specifically.  She is not having neck pain.  She did recently have lumbar spine surgery but continues to recover well from that procedure.  She occasionally has some discomfort in the left forearm but that is not consistent.  She denies any vision or speech change.  No difficulty with walking or numbness/weakness in the left lower extremity or HA.  ? ?Past Medical History:  ?Diagnosis Date  ? Arthritis   ? Cancer [facility])   ? BASAL CELL CA  ? Depression   ? GERD (gastroesophageal reflux disease)   ? TAKES TUMS PRN  ? Heart murmur   ? "childhood" - no follow up as an adult  ? Hypertension   ? Osteopenia   ? Shortness of breath dyspnea   ? WITH EXERTION  ? Stroke [facility])   ? 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 Tendency toward bleeding easily ([place])   ? ? ?Review of Systems ? ?Constitutional: No fever/chills ?Eyes: No visual changes. ?ENT: No sore throat. ?Cardiovascular: Denies chest pain. ?Respiratory: Denies shortness of breath. ?Gastrointestinal: No abdominal pain.  No nausea, no vomiting.  No diarrhea.  No constipation. ?Genitourinary: Negative for dysuria. ?Musculoskeletal: Negative for back pain. ?Skin: Negative for rash. ?Neurological: Negative for headaches. Positive left hand weakness.   ? ? ?____________________________________________ ? ? ?PHYSICAL EXAM: ? ?VITAL SIGNS: ?ED Triage Vitals  ?Enc Vitals Group  ?   BP 10/08/21 1920 120/88  ?   Pulse Rate 10/08/21 1920 (!) 103  ?   Resp 10/08/21 1920 19  ?   Temp 10/08/21 1920 98.7 ?F (37.1 ?C)  ?   Temp src --   ?   SpO2 10/08/21 1920 90 %  ?   Weight 10/08/21 1920 190 lb (86.2 kg)  ?   Height 10/08/21 1920 '5\' 6"'$  (1.676 m)  ? ?Constitutional: Alert and oriented. Well appearing and in no acute distress. ?Eyes: Conjunctivae are normal.  ?Head: Atraumatic. ?Nose: No congestion/rhinnorhea. ?Mouth/Throat: Mucous membranes are moist.  ?Neck: No stridor.  ?Cardiovascular: Normal rate, regular rhythm. Good peripheral circulation. Grossly normal heart sounds.   ?Respiratory: Normal respiratory effort.  No retractions. Lungs CTAB. ?Gastrointestinal: Soft and nontender. No distention.  ?Musculoskeletal: No lower extremity tenderness nor edema. No gross deformities of extremities. ?Neurologic:  Normal speech and language.  No facial asymmetry or sensation difference to the face.  Grip strength is equal in the bilateral upper extremities but patient does have a slight pronator drift with the left upper extremity.  Some ataxia with finger-to-nose testing using the left upper.  Normal exam on the right.  5/5 strength in the lower extremities with normal sensation.  ?Skin:  Skin is warm, dry and intact. No rash noted. ? ? ?____________________________________________ ?  ?LABS ?(all labs ordered are listed, but only abnormal  results are displayed) ? ?Labs Reviewed  ?CBC - Abnormal; Notable for the following components:  ?    Result Value  ? MCV 100.5 (*)   ? All other components within normal limits  ?DIFFERENTIAL - Abnormal; Notable for the following components:  ? Monocytes Absolute 1.2 (*)   ? All other components within normal limits  ?COMPREHENSIVE METABOLIC PANEL - Abnormal; Notable for the following components:  ? Glucose, Bld 130 (*)   ? BUN 32 (*)   ?  Creatinine, Ser 1.39 (*)   ? GFR, Estimated 38 (*)   ? All other components within normal limits  ?HEMOGLOBIN A1C - Abnormal; Notable for the following components:  ? Hgb A1c MFr Bld 5.9 (*)   ? All other components within normal limits  ?COMPREHENSIVE METABOLIC PANEL - Abnormal; Notable for the following components:  ? Potassium 3.0 (*)   ? Glucose, Bld 265 (*)   ? BUN 26 (*)   ? Creatinine, Ser 1.25 (*)   ? Total Protein 5.4 (*)   ? Albumin 3.2 (*)   ? GFR, Estimated 44 (*)   ? All other components within normal limits  ?CBG MONITORING, ED - Abnormal; Notable for the following components:  ? Glucose-Capillary 131 (*)   ? All other components within normal limits  ?PROTIME-INR  ?APTT  ?LIPID PANEL  ? ?____________________________________________ ? ?EKG ? ? EKG Interpretation ? ?Date/Time:  Tuesday October 08 2021 19:40:50 EDT ?Ventricular Rate:  83 ?PR Interval:  158 ?QRS Duration: 92 ?QT Interval:  369 ?QTC Calculation: 434 ?R Axis:   -90 ?Text Interpretation: Sinus rhythm Left anterior fascicular block Low voltage, precordial leads Similar to Feb 2023 tracing Confirmed by Nanda Quinton [phone]) on 10/08/2021 7:53:56 PM ?  ? ?  ? ? ?____________________________________________ ? ?RADIOLOGY ? ?CT Angio Head W or Wo Contrast ? ?Result Date: 10/08/2021 ?CLINICAL DATA:  Stroke follow-up EXAM: CT ANGIOGRAPHY HEAD AND NECK TECHNIQUE: Multidetector CT imaging of the head and neck was performed using the standard protocol during bolus administration of intravenous contrast. Multiplanar CT image reconstructions and MIPs were obtained to evaluate the vascular anatomy. Carotid stenosis measurements (when applicable) are obtained utilizing NASCET criteria, using the distal internal carotid diameter as the denominator. RADIATION DOSE REDUCTION: This exam was performed according to the departmental dose-optimization program which includes automated exposure control, adjustment of the mA and/or kV according to patient size and/or use  of iterative reconstruction technique. CONTRAST:  73m OMNIPAQUE IOHEXOL 350 MG/ML SOLN COMPARISON:  None. FINDINGS: CTA NECK FINDINGS SKELETON: There is no bony spinal canal stenosis. No lytic or blastic lesion. OTHER NECK: Normal pharynx, larynx and major salivary glands. No cervical lymphadenopathy. Unremarkable thyroid gland. UPPER CHEST: No pneumothorax or pleural effusion. No nodules or masses. AORTIC ARCH: There is no calcific atherosclerosis of the aortic arch. There is no aneurysm, dissection or hemodynamically significant stenosis of the visualized portion of the aorta. Conventional 3 vessel aortic branching pattern. The visualized proximal subclavian arteries are widely patent. RIGHT CAROTID SYSTEM: Normal without aneurysm, dissection or stenosis. LEFT CAROTID SYSTEM: Normal without aneurysm, dissection or stenosis. VERTEBRAL ARTERIES: Left dominant configuration. Both origins are clearly patent. There is no dissection, occlusion or flow-limiting stenosis to the skull base (V1-V3 segments). CTA HEAD FINDINGS POSTERIOR CIRCULATION: --Vertebral arteries: Normal V4 segments. --Inferior cerebellar arteries: Normal. --Basilar artery: Normal. --Superior cerebellar arteries: Normal. --Posterior cerebral arteries (PCA): Normal. ANTERIOR CIRCULATION: --Intracranial internal carotid arteries: Normal. --Anterior cerebral arteries (ACA): Normal. Both A1 segments are present. Patent  anterior communicating artery (a-comm). --Middle cerebral arteries (MCA): Normal. VENOUS SINUSES: As permitted by contrast timing, patent. ANATOMIC VARIANTS: None Review of the MIP images confirms the above findings. IMPRESSION: 1. No emergent large vessel occlusion or high-grade stenosis of the intracranial arteries. 2. No dissection, aneurysm or hemodynamically significant stenosis of the carotid or vertebral arteries. Electronically Signed   By: Ulyses Jarred M.D.   On: 10/08/2021 21:14  ? ?CT HEAD WO CONTRAST ? ?Result Date:  10/08/2021 ?CLINICAL DATA:  Transient ischemic attack (TIA) EXAM: CT HEAD WITHOUT CONTRAST TECHNIQUE: Contiguous axial images were obtained from the base of the skull through the vertex without intravenous contrast. RADIATION DOSE REDUCTION: This exam was performed according t

## 2021-10-08 NOTE — Plan of Care (Signed)
[facility] will assume care on arrival to accepting facility. Until arrival, care as per EDP. However, [facility] available 24/7 for questions and assistance.  Nursing staff, please page [facility] Admits and Consults ([phone]) as soon as the patient arrives to the hospital.   

## 2021-10-09 ENCOUNTER — Inpatient Hospital Stay (HOSPITAL_BASED_OUTPATIENT_CLINIC_OR_DEPARTMENT_OTHER): Payer: Medicare PPO

## 2021-10-09 ENCOUNTER — Inpatient Hospital Stay (HOSPITAL_COMMUNITY): Payer: Medicare PPO

## 2021-10-09 ENCOUNTER — Encounter (HOSPITAL_COMMUNITY)
Admission: EM | Disposition: A | Discharge status: Home / Self Care | Payer: Self-pay | Source: Home / Self Care | Attending: Emergency Medicine

## 2021-10-09 DIAGNOSIS — R531 Weakness: Secondary | ICD-10-CM | POA: Diagnosis not present

## 2021-10-09 DIAGNOSIS — E785 Hyperlipidemia, unspecified: Secondary | ICD-10-CM

## 2021-10-09 DIAGNOSIS — K219 Gastro-esophageal reflux disease without esophagitis: Secondary | ICD-10-CM

## 2021-10-09 DIAGNOSIS — I639 Cerebral infarction, unspecified: Secondary | ICD-10-CM

## 2021-10-09 DIAGNOSIS — I63411 Cerebral infarction due to embolism of right middle cerebral artery: Secondary | ICD-10-CM | POA: Diagnosis not present

## 2021-10-09 DIAGNOSIS — N1831 Chronic kidney disease, stage 3a: Secondary | ICD-10-CM | POA: Diagnosis not present

## 2021-10-09 DIAGNOSIS — I10 Essential (primary) hypertension: Secondary | ICD-10-CM

## 2021-10-09 DIAGNOSIS — I6389 Other cerebral infarction: Secondary | ICD-10-CM

## 2021-10-09 HISTORY — PX: LOOP RECORDER INSERTION: EP1214

## 2021-10-09 LAB — COMPREHENSIVE METABOLIC PANEL
ALT: 16 U/L (ref 0–44)
AST: 23 U/L (ref 15–41)
Albumin: 3.2 g/dL — ABNORMAL LOW (ref 3.5–5.0)
Alkaline Phosphatase: 47 U/L (ref 38–126)
Anion gap: 11 (ref 5–15)
BUN: 26 mg/dL — ABNORMAL HIGH (ref 8–23)
CO2: 25 mmol/L (ref 22–32)
Calcium: 9 mg/dL (ref 8.9–10.3)
Chloride: 101 mmol/L (ref 98–111)
Creatinine, Ser: 1.25 mg/dL — ABNORMAL HIGH (ref 0.44–1.00)
GFR, Estimated: 44 mL/min — ABNORMAL LOW (ref >60)
Glucose, Bld: 265 mg/dL — ABNORMAL HIGH (ref 70–99)
Potassium: 3 mmol/L — ABNORMAL LOW (ref 3.5–5.1)
Sodium: 137 mmol/L (ref 135–145)
Total Bilirubin: 0.5 mg/dL (ref 0.3–1.2)
Total Protein: 5.4 g/dL — ABNORMAL LOW (ref 6.5–8.1)

## 2021-10-09 LAB — LIPID PANEL
Cholesterol: 122 mg/dL (ref 0–200)
HDL: 68 mg/dL (ref >40)
LDL Cholesterol: 35 mg/dL (ref 0–99)
Total CHOL/HDL Ratio: 1.8 RATIO
Triglycerides: 95 mg/dL (ref <150)
VLDL: 19 mg/dL (ref 0–40)

## 2021-10-09 LAB — ECHOCARDIOGRAM COMPLETE
AR max vel: 2.2 cm2
AV Area VTI: 2.1 cm2
AV Area mean vel: 2.11 cm2
AV Mean grad: 3 mmHg
AV Peak grad: 6.4 mmHg
Ao pk vel: 1.26 m/s
Area-P 1/2: 3.6 cm2
Calc EF: 76.7 %
Height: 66 in
S' Lateral: 2.9 cm
Single Plane A2C EF: 80.5 %
Single Plane A4C EF: 71.4 %
Weight: 3065.28 oz

## 2021-10-09 LAB — HEMOGLOBIN A1C
Hgb A1c MFr Bld: 5.9 % — ABNORMAL HIGH (ref 4.8–5.6)
Mean Plasma Glucose: 122.63 mg/dL

## 2021-10-09 SURGERY — LOOP RECORDER INSERTION

## 2021-10-09 MED ORDER — SODIUM CHLORIDE 0.9 % IV SOLN: INTRAVENOUS | Status: AC

## 2021-10-09 MED ORDER — ASPIRIN EC 81 MG PO TBEC
81.0000 mg | DELAYED_RELEASE_TABLET | Freq: Every day | ORAL | Status: DC
  Administered 2021-10-09: 81 mg via ORAL
  Filled 2021-10-09: qty 1

## 2021-10-09 MED ORDER — ACETAMINOPHEN 160 MG/5ML PO SOLN: 650.0000 mg | ORAL | Status: DC | PRN

## 2021-10-09 MED ORDER — ALPRAZOLAM 0.5 MG PO TABS
0.5000 mg | ORAL_TABLET | Freq: Every evening | ORAL | Status: DC | PRN
  Administered 2021-10-09: 0.5 mg via ORAL
  Filled 2021-10-09: qty 1

## 2021-10-09 MED ORDER — ACETAMINOPHEN 650 MG RE SUPP: 650.0000 mg | RECTAL | Status: DC | PRN

## 2021-10-09 MED ORDER — ACETAMINOPHEN 325 MG PO TABS: 650.0000 mg | ORAL_TABLET | ORAL | Status: DC | PRN

## 2021-10-09 MED ORDER — HEPARIN SODIUM (PORCINE) 5000 UNIT/ML IJ SOLN
5000.0000 [IU] | Freq: Three times a day (TID) | INTRAMUSCULAR | Status: DC
  Administered 2021-10-09 (×2): 5000 [IU] via SUBCUTANEOUS
  Filled 2021-10-09 (×2): qty 1

## 2021-10-09 MED ORDER — STROKE: EARLY STAGES OF RECOVERY BOOK
Freq: Once | Status: AC
  Filled 2021-10-09: qty 1

## 2021-10-09 MED ORDER — CLOPIDOGREL BISULFATE 75 MG PO TABS
75.0000 mg | ORAL_TABLET | Freq: Every day | ORAL | Status: DC
  Administered 2021-10-09: 75 mg via ORAL
  Filled 2021-10-09: qty 1

## 2021-10-09 MED ORDER — LIDOCAINE-EPINEPHRINE 1 %-1:100000 IJ SOLN
INTRAMUSCULAR | Status: AC
  Filled 2021-10-09: qty 1

## 2021-10-09 MED ORDER — CITALOPRAM HYDROBROMIDE 20 MG PO TABS
20.0000 mg | ORAL_TABLET | Freq: Every day | ORAL | Status: DC
  Administered 2021-10-09: 20 mg via ORAL
  Filled 2021-10-09: qty 1

## 2021-10-09 MED ORDER — LIDOCAINE-EPINEPHRINE 1 %-1:100000 IJ SOLN
INTRAMUSCULAR | Status: DC | PRN
Start: 2021-10-09 — End: 2021-10-09
  Administered 2021-10-09: 10 mL

## 2021-10-09 MED ORDER — ASPIRIN 81 MG PO TBEC: 81.0000 mg | DELAYED_RELEASE_TABLET | Freq: Every day | ORAL | 0 refills | Status: AC

## 2021-10-09 SURGICAL SUPPLY — 2 items
PACK LOOP INSERTION (CUSTOM PROCEDURE TRAY) ×1 IMPLANT
SYSTEM MONITOR REVEAL LINQ II (Prosthesis & Implant Heart) ×1 IMPLANT

## 2021-10-09 NOTE — Assessment & Plan Note (Signed)
Stable

## 2021-10-09 NOTE — Assessment & Plan Note (Signed)
On repatha. Not on statins due to myalgias. ?

## 2021-10-09 NOTE — Progress Notes (Signed)
? ?  Echocardiogram ?2D Echocardiogram has been performed. ? ?Beryle Beams ?10/09/2021, 11:24 AM ?

## 2021-10-09 NOTE — Assessment & Plan Note (Addendum)
-   Recheck at follow up ?

## 2021-10-09 NOTE — Consult Note (Addendum)
? ? ?ELECTROPHYSIOLOGY CONSULT NOTE  ?Patient ID: Ebony [name] ?MRN: [id number], DOB/AGE: August 29, 1940  ? ?Admit date: 10/08/2021 ?Date of Consult: 10/09/2021 ? ?Primary Physician: Velna Hatchet, MD ?Primary Cardiologist: None  ?Primary Electrophysiologist: New to Dr. Lovena Le ?Reason for Consultation: Cryptogenic stroke; recommendations regarding Implantable Loop Recorder ?Insurance: [employer] ? ?History of Present Illness ?EP has been asked to evaluate Ebony [name] for placement of an implantable loop recorder to monitor for atrial fibrillation by Dr Erlinda Hong.  The patient was admitted on 10/08/2021 with left arm weakness.  Reports h/o TIA ? 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   ? ?She has undergone work up for stroke including: ?CT head demonstrated no acute intracranial abnormality. ?CT angio head and neck showed no large vessel occlusion. 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 ?Echo 3/22 with LVEF 60-65% ?Carotid Doppler 04/2020 with bilateral 1-39% ?LDL 35 ?HgbA1c 5.9 ?now on ASA and plavix ?Ongoing aggressive stroke risk factor management ?Therapy recommendations:  No OT follow up recommended ?Disposition:  Possibly home today per Dr. Erlinda Hong. ? ? ?The patient has been monitored on telemetry which has demonstrated sinus rhythm with no arrhythmias.  Inpatient stroke work-up will not require a TEE per Neurology.  ? ?Echocardiogram as above. Lab work is reviewed. ? ?Prior to admission, the patient denies chest pain, shortness of breath, dizziness, palpitations, or syncope.  She is recovering from his stroke with plans to return home  at discharge. ? ?Past Medical History:  ?Diagnosis Date  ? Arthritis   ? Cancer [facility])   ? BASAL CELL CA  ? Depression   ? GERD (gastroesophageal reflux disease)   ? TAKES TUMS PRN  ? Heart murmur   ? "childhood" - no follow up as an adult  ? Hypertension   ? Osteopenia   ?  Shortness of breath dyspnea   ? WITH EXERTION  ? Stroke [facility]   ? 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 Tendency toward bleeding easily ([place])   ?  ? ?Surgical History:  ?Past Surgical History:  ?Procedure Laterality Date  ? BUNIONECTOMY Right   ? CARDIAC CATHETERIZATION    ? CATARACT EXTRACTION Bilateral   ? KNEE ARTHROPLASTY Right 11/29/2015  ? Procedure: RIGHT TOTAL KNEE ARTHROPLASTY ;  Surgeon: Rod Can, MD;  Location: [facility];  Service: Orthopedics;  Laterality: Right;  ? KNEE ARTHROSCOPY Right 10/05/2013  ? Procedure: RIGHT KNEE ARTHROSCOPY WITH medial and lateral DEBRIDEMENT chrondroplasty;  Surgeon: [name], MD;  Location: [facility];  Service: Orthopedics;  Laterality: Right;  ? LUMBAR LAMINECTOMY/DECOMPRESSION MICRODISCECTOMY N/A 09/12/2021  ? Procedure: Lumbar decompression Lumbar three - Lumbar four and lumbar four-five;  Surgeon: Susa Day, MD;  Location: [place];  Service: Orthopedics;  Laterality: N/A;  ? MOHS SURGERY ON NOSE 01/2007, MOHS ON FACE 08/2012    ? TONSILLECTOMY    ?  ? ?Medications Prior to Admission  ?Medication Sig Dispense Refill Last Dose  ? acetaminophen (TYLENOL) 500 MG tablet Take 1,000 mg by mouth every 6 (six) hours as needed for headache (pain).   week ago  ? allopurinol (ZYLOPRIM) 100 MG tablet Take 100 mg by mouth every morning.   10/08/2021  ? ALPRAZolam (XANAX) 0.5 MG tablet Take 0.5 mg by mouth at bedtime as needed for anxiety or sleep.   0 few nights ago  ? citalopram (CELEXA) 20 MG tablet Take 20 mg by mouth every morning.  1 10/08/2021  ? clopidogrel (PLAVIX) 75 MG tablet Take 75 mg by mouth every morning.   10/08/2021  ? Evolocumab (REPATHA) 140 MG/ML SOSY Inject 140 mg into the skin every 14 (fourteen) days.   3/19 or 3/20  ? terbinafine (LAMISIL) 250 MG tablet Take 1 tablet (250 mg total) by mouth daily. (Patient taking differently: Take 250 mg by mouth at bedtime.) 30 tablet 0 10/07/2021  ? triamterene-hydrochlorothiazide (DYAZIDE) 37.5-25 MG capsule Take 1 capsule by  mouth every morning.  6 10/08/2021  ? docusate sodium (COLACE) 100 MG capsule Take 1 capsule (100 mg total) by mouth 2 (two) times daily as needed for mild constipation. (Patient not taking: Reported on 10/09/2021) 30 capsule 1 Not Taking  ? oxyCODONE (OXY IR/ROXICODONE) 5 MG immediate release tablet Take 1 tablet (5 mg total) by mouth every 4 (four) hours as needed for severe pain. (Patient not taking: Reported on 10/09/2021) 40 tablet 0 Not Taking  ? polyethylene glycol (MIRALAX / GLYCOLAX) 17 g packet Take 17 g by mouth daily. (Patient not taking: Reported on 10/09/2021) 14 each 0 Not Taking  ? ? ?Inpatient Medications:  ? aspirin EC  81 mg Oral Daily  ? citalopram  20 mg Oral Daily  ? clopidogrel  75 mg Oral Daily  ? heparin  5,000 Units Subcutaneous Q8H  ? sodium chloride flush  3 mL Intravenous Once  ? ? ?Allergies:  ?Allergies  ?Allergen Reactions  ? Other Anaphylaxis  ?  Muscle Relaxer-Unsure which one  ? Codeine Nausea And Vomiting  ? Statins Other (See Comments)  ?   myalgias  ? Sulfa Antibiotics Other (See Comments)  ?  Red streaks on nose- ? cellulitis  ? ? ?Social History  ? ?Socioeconomic History  ? Marital status: Single  ?  Spouse name: Not on file  ? Number of children: Not on file  ? Years of education: Not on file  ? Highest education level: Not on file  ?Occupational History  ? Not on file  ?Tobacco Use  ? Smoking status: Former  ?  Packs/day: 1.00  ?  Years: 10.00  ?  Pack years: 10.00  ?  Types: Cigarettes  ?  Quit date: 10/04/1971  ?  Years since quitting: 50.0  ? Smokeless tobacco: Never  ?Vaping Use  ? Vaping Use: Never used  ?Substance and Sexual Activity  ? Alcohol use: Yes  ?  Comment: OCCAS ALCOHOL  ? Drug use: No  ? Sexual activity: Not on file  ?Other Topics Concern  ? Not on file  ?Social History Narrative  ? Not on file  ? ?Social Determinants of Health  ? ?Financial Resource Strain: Not on file  ?Food Insecurity: Not on file  ?Transportation Needs: Not on file  ?Physical Activity: Not on  file  ?Stress: Not on file  ?Social Connections: Not on file  ?Intimate Partner Violence: Not on file  ?  ? ?No family history on file.  ? ? ?Review of Systems: ?All other systems reviewed and are otherwise negative except as noted above. ? ?Physical Exam: ?Vitals:  ? 10/09/21 0110 10/09/21 0400 10/09/21 0835 10/09/21 1211  ?BP: (!) 157/77 132/60 132/65 (!) 118/50  ?Pulse: 73 82 83 78  ?Resp: 17 (!) '24 20 18  '$ ?Temp: 98.5 ?F (36.9 ?C) 98.3 ?F (36.8 ?C) 97.8 ?F (36.6 ?C) 98 ?F (36.7 ?C)  ?TempSrc: Oral Oral Oral Oral  ?SpO2: 97% 95% 94% 93%  ?Weight: 86.9 kg     ?Height: 5'  6" (1.676 m)     ? ? ?GEN- The patient is well appearing, alert and oriented x 3 today.   ?Head- normocephalic, atraumatic ?Eyes-  Sclera clear, conjunctiva pink ?Ears- hearing intact ?Oropharynx- clear ?Neck- supple ?Lungs- Clear to ausculation bilaterally, normal work of breathing ?Heart- Regular rate and rhythm, no murmurs, rubs or gallops  ?GI- soft, NT, ND, + BS ?Extremities- no clubbing, cyanosis, or edema ?MS- no significant deformity or atrophy ?Skin- no rash or lesion ?Psych- euthymic mood, full affect ?Neuro- Mild weakness and paraesthesias in left hand ? ? ?Labs: ?  ?Lab Results  ?Component Value Date  ? WBC 8.4 10/08/2021  ? HGB 13.5 10/08/2021  ? HCT 40.4 10/08/2021  ? MCV 100.5 (H) 10/08/2021  ? PLT 224 10/08/2021  ?  ?Recent Labs  ?Lab 10/09/21 ?0325  ?NA 137  ?K 3.0*  ?CL 101  ?CO2 25  ?BUN 26*  ?CREATININE 1.25*  ?CALCIUM 9.0  ?PROT 5.4*  ?BILITOT 0.5  ?ALKPHOS 47  ?ALT 16  ?AST 23  ?GLUCOSE 265*  ? ?  ?Radiology/Studies: CT Angio Head W or Wo Contrast ? ?Result Date: 10/08/2021 ?CLINICAL DATA:  Stroke follow-up EXAM: CT ANGIOGRAPHY HEAD AND NECK TECHNIQUE: Multidetector CT imaging of the head and neck was performed using the standard protocol during bolus administration of intravenous contrast. Multiplanar CT image reconstructions and MIPs were obtained to evaluate the vascular anatomy. Carotid stenosis measurements (when  applicable) are obtained utilizing NASCET criteria, using the distal internal carotid diameter as the denominator. RADIATION DOSE REDUCTION: This exam was performed according to the departmental dose-optimization pro

## 2021-10-09 NOTE — TOC Transition Note (Signed)
Transition of Care (TOC) - CM/SW Discharge Note ? ? ?Patient Details  ?Name: Ebony [name] ?MRN: [id number] ?Date of Birth: 03-09-1941 ? ?Transition of Care (TOC) CM/SW Contact:  ?Benard Halsted, LCSW ?Phone Number: ?10/09/2021, 5:23 PM ? ? ?Clinical Narrative:    ?CSW spoke with patient regarding outpatient therapy recommendation. Patient's grandson at bedside. Patient declined therapy and stated she was provided exercises to do at home. No other needs identified at this time.  ? ? ?Final next level of care: Home/Self Care ?Barriers to Discharge: No Barriers Identified ? ? ?Patient Goals and CMS Choice ?  ?CMS Medicare.gov Compare Post Acute Care list provided to:: Patient ?Choice offered to / list presented to : Patient ? ?Discharge Placement ?  ?           ?  ?  ?  ?  ? ?Discharge Plan and Services ?  ?  ?           ?  ?  ?  ?  ?  ?  ?  ?  ?  ?  ? ?Social Determinants of Health (SDOH) Interventions ?  ? ? ?Readmission Risk Interventions ?   ? View : No data to display.  ?  ?  ?  ? ? ? ? ? ?

## 2021-10-09 NOTE — Care Management CC44 (Signed)
Condition Code 44 Documentation Completed ? ?Patient Details  ?Name: Ebony [name] ?MRN: [id number] ?Date of Birth: 25-Apr-1941 ? ? ?Condition Code 44 given:  Yes ?Patient signature on Condition Code 44 notice:  Yes ?Documentation of 2 MD's agreement:  Yes ?Code 44 added to claim:  Yes ? ? ? ?Benard Halsted, LCSW ?10/09/2021, 5:22 PM ? ?

## 2021-10-09 NOTE — Discharge Instructions (Signed)

## 2021-10-09 NOTE — Assessment & Plan Note (Signed)
Allow for permissive hypertension(SBP <220). ?

## 2021-10-09 NOTE — Consult Note (Addendum)
Neurology Consultation ? ?ATTENDING NOTE: ?I reviewed above note and agree with the assessment and plan. Pt was seen and examined.  ? ?81 year old female with history of CKD 3A, hypertension, hyperlipidemia, TIA admitted for left arm weakness and description consistent with "alien hand".  CT no acute abnormality.  CT head and neck unremarkable.  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  EF 60 to 65%, LE venous Doppler no DVT.,  A1c 5.9, LDL 35.  Creatinine 1.39-1.25. ? ?On exam, patient neurologically intact, symptom-free at this time.  Etiology for patient stroke consistent with embolic pattern, highly concerning for occult A-fib.  Recommend loop recorder interrogation to rule out A-fib as outpatient.  Continue aspirin 81 and Plavix 75 DAPT for 3 weeks and then Plavix alone.  Continue home [place].  PT/OT no recommendation. ? ?For detailed assessment and plan, please refer to above as I have made changes wherever appropriate.  ? ?Neurology will sign off. Please call with questions. Pt will follow up with stroke clinic NP at [facility] in about 4 weeks. Thanks for the consult. ? ? ?Rosalin Hawking, MD PhD ?Stroke Neurology ?10/09/2021 ?6:24 PM ? ? ? ?Reason for Consult: Stroke ?Referring Physician: Bridgett Larsson ? ?CC: left arm weakness ? ?History is obtained from: Patient and chart ? ?HPI: Ebony [name] is a 81 y.o. female with history of TIA, CKD stage IIIa, hypertension, reflux, history of spinal stenosis status post back surgery a few weeks ago presents to the ER today with left arm weakness starting around 8 AM on 3/21.  She was try to get changed in her bedroom after waking up and taking a shower and noticed that her left arm reached over her head and then she is staring at the palm of her left hand over her head.  She had not realized that her arm was over the top of her head at the time and she did not feel she had any control over it.  She stated that her arm was twisting in a with the range of motion.  She had to use  her right hand to bring down her left arm.  She was unable to get dressed without assistance but then went about her day around lunchtime she was holding her left and spilled her drink.  She was unable to hold cards in her left hand when she returned to play cards at her friend's house.  Her friends decided to take her to the ER for an evaluation.  She reports having a numbness, tingling, rubber like sensation in her arm since about 8 AM on 321.  This improved once she arrived to the ED in the evening.  She states that she is still having some "slowness" in her hand however she has control in her arm.  ? ?LKW: 1000 ?TNK given?: no, LKW outside window for TNKase ?IR Thrombectomy? No, No LVO  ? ?ROS: A complete ROS was performed and is negative except as noted in the HPI.  ? ?Past Medical History:  ?Diagnosis Date  ? Arthritis   ? Cancer [facility])   ? BASAL CELL CA  ? Depression   ? GERD (gastroesophageal reflux disease)   ? TAKES TUMS PRN  ? Heart murmur   ? "childhood" - no follow up as an adult  ? Hypertension   ? Osteopenia   ? Shortness of breath dyspnea   ? WITH EXERTION  ? Stroke [facility])   ? 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 Tendency toward bleeding  easily [facility])   ? ? ? ?No family history on file. ? ? ?Social History:  ? reports that she quit smoking about 50 years ago. Her smoking use included cigarettes. She has a 10.00 pack-year smoking history. She has never used smokeless tobacco. She reports current alcohol use. She reports that she does not use drugs. ? ?Medications ? ?Current Facility-Administered Medications:  ?  0.9 %  sodium chloride infusion, , Intravenous, Continuous, Kristopher Oppenheim, DO, Last Rate: 50 mL/hr at 10/09/21 0302, New Bag at 10/09/21 0302 ?  acetaminophen (TYLENOL) tablet 650 mg, 650 mg, Oral, Q4H PRN **OR** acetaminophen (TYLENOL) 160 MG/5ML solution 650 mg, 650 mg, Per Tube, Q4H PRN **OR** acetaminophen (TYLENOL) suppository 650 mg, 650 mg, Rectal, Q4H PRN, Kristopher Oppenheim, DO ?  ALPRAZolam Duanne Moron) tablet  0.5 mg, 0.5 mg, Oral, QHS PRN, Kristopher Oppenheim, DO, 0.5 mg at 10/09/21 1308 ?  aspirin EC tablet 81 mg, 81 mg, Oral, Daily, Kristopher Oppenheim, DO, 81 mg at 10/09/21 6578 ?  citalopram (CELEXA) tablet 20 mg, 20 mg, Oral, Daily, Kristopher Oppenheim, DO, 20 mg at 10/09/21 4696 ?  clopidogrel (PLAVIX) tablet 75 mg, 75 mg, Oral, Daily, Kristopher Oppenheim, DO, 75 mg at 10/09/21 2952 ?  heparin injection 5,000 Units, 5,000 Units, Subcutaneous, Q8H, Kristopher Oppenheim, DO, 5,000 Units at 10/09/21 8413 ?  sodium chloride flush (NS) 0.9 % injection 3 mL, 3 mL, Intravenous, Once, Kristopher Oppenheim, DO ? ?Exam: ?Current vital signs: ?BP 132/65 (BP Location: Right Arm)   Pulse 83   Temp 97.8 ?F (36.6 ?C) (Oral)   Resp 20   Ht '5\' 6"'$  (1.676 m)   Wt 86.9 kg   SpO2 94%   BMI 30.92 kg/m?  ?Vital signs in last 24 hours: ?Temp:  [97.8 ?F (36.6 ?C)-98.7 ?F (37.1 ?C)] 97.8 ?F (36.6 ?C) (03/22 2440) ?Pulse Rate:  [64-103] 83 (03/22 0835) ?Resp:  [17-27] 20 (03/22 0835) ?BP: (120-157)/(60-89) 132/65 (03/22 0835) ?SpO2:  [90 %-97 %] 94 % (03/22 0835) ?Weight:  [86.2 kg-86.9 kg] 86.9 kg (03/22 0110) ? ?GENERAL: Awake, alert, in no acute distress ?Psych: Affect appropriate for situation, patient is calm and cooperative with examination ?Head: Normocephalic and atraumatic, without obvious abnormality ?EENT: Normal conjunctivae, dry mucous membranes, no OP obstruction ?LUNGS: Normal respiratory effort. Non-labored breathing on room air ?CV: Regular rate and rhythm on telemetry ?ABDOMEN: Soft, non-tender, non-distended ?Extremities: warm, well perfused, without obvious deformity ? ?NEURO:  ?Mental Status: Awake, alert, and oriented to person, place, time, and situation. ?She is able to provide a clear and coherent history of present illness. ?Speech/Language: speech is clear.   ?Naming, repetition, fluency, and comprehension intact without aphasia  ?No neglect is noted ?Cranial Nerves:  ?II: PERRL 3 mm/brisk. visual fields full.  ?III, IV, VI: EOMI. Lid elevation symmetric and  full.  ?V: Sensation is intact to light touch and symmetrical to face. Blinks to threat. Moves jaw back and forth.  ?VII: Face is symmetric resting and smiling. Able to puff cheeks and raise eyebrows.  ?VIII: Hearing intact to voice ?IX, X: Palate elevation is symmetric. Phonation normal.  ?XI: Normal sternocleidomastoid and trapezius muscle strength ?XII: Tongue protrudes midline without fasciculations.   ?Motor: ?Tone and bulk are normal.  Proximal and distal strength is 5/5 in all extremities. ?Sensation: Intact to light touch bilaterally in all four extremities. No extinction to DSS present.  ?Coordination: HKS intact bilaterally. No pronator drift.  ?Rapid alternating hand movements slowed with left hand.  ?DTRs: 2+ throughout.  ?  Gait: Deferred ? ? ?Labs ?I have reviewed labs in epic and the results pertinent to this consultation are: ? ?CBC ?   ?Component Value Date/Time  ? WBC 8.4 10/08/2021 1936  ? RBC 4.02 10/08/2021 1936  ? HGB 13.5 10/08/2021 1936  ? HCT 40.4 10/08/2021 1936  ? PLT 224 10/08/2021 1936  ? MCV 100.5 (H) 10/08/2021 1936  ? MCH 33.6 10/08/2021 1936  ? MCHC 33.4 10/08/2021 1936  ? RDW 12.8 10/08/2021 1936  ? LYMPHSABS 2.1 10/08/2021 1936  ? MONOABS 1.2 (H) 10/08/2021 1936  ? EOSABS 0.3 10/08/2021 1936  ? BASOSABS 0.0 10/08/2021 1936  ? ? ?CMP  ?   ?Component Value Date/Time  ? NA 137 10/09/2021 0325  ? K 3.0 (L) 10/09/2021 0325  ? CL 101 10/09/2021 0325  ? CO2 25 10/09/2021 0325  ? GLUCOSE 265 (H) 10/09/2021 0325  ? BUN 26 (H) 10/09/2021 0325  ? CREATININE 1.25 (H) 10/09/2021 0325  ? CREATININE 1.12 (H) 07/25/2021 1459  ? CALCIUM 9.0 10/09/2021 0325  ? PROT 5.4 (L) 10/09/2021 0325  ? ALBUMIN 3.2 (L) 10/09/2021 0325  ? AST 23 10/09/2021 0325  ? ALT 16 10/09/2021 0325  ? ALKPHOS 47 10/09/2021 0325  ? BILITOT 0.5 10/09/2021 0325  ? GFRNONAA 44 (L) 10/09/2021 0325  ? GFRAA >60 11/30/2015 0402  ? ? ?Lipid Panel  ?   ?Component Value Date/Time  ? CHOL 122 10/09/2021 0325  ? TRIG 95 10/09/2021 0325   ? HDL 68 10/09/2021 0325  ? CHOLHDL 1.8 10/09/2021 0325  ? VLDL 19 10/09/2021 0325  ? [place] 35 10/09/2021 0325  ? ? ? ?Imaging ?I have reviewed the images obtained: ? ?CT head demonstrated no acute intracranial

## 2021-10-09 NOTE — Care Management Obs Status (Signed)
MEDICARE OBSERVATION STATUS NOTIFICATION ? ? ?Patient Details  ?Name: [name] ?MRN: [id number] ?Date of Birth: 12-31-1940 ? ? ?Medicare Observation Status Notification Given:  Yes ? ? ? ?Benard Halsted, LCSW ?10/09/2021, 5:22 PM ?

## 2021-10-09 NOTE — Progress Notes (Signed)
?  Transition of Care (TOC) Screening Note ? ? ?Patient Details  ?Name: Ebony [name] ?Date of Birth: 1941/06/23 ? ? ?Transition of Care (TOC) CM/SW Contact:    ?Cyndi Bender, RN ?Phone Number: ?10/09/2021, 8:57 AM ? ? ? ?Transition of Care Department [facility]) has reviewed patient and no TOC needs have been identified at this time. We will continue to monitor patient advancement through interdisciplinary progression rounds. If new patient transition needs arise, please place a TOC consult. ? ? ?

## 2021-10-09 NOTE — Subjective & Objective (Signed)
CC: left arm weakness ?HPI: ?81 yo WF with history of TIA, CKD stage IIIa, hypertension, reflux, history of spinal stenosis status post back surgery presents to the ER today with left arm weakness starting around 8 AM.  Patient states that she got out of bed this morning.  She got out of the shower.  She was try to get changed in her bedroom.  She noticed that her left arm had reached over her head and she was staring at the palm of her left hand over her head.  She did not realize that her arm was over the top of her head at that time.  She states that she did not have any control over it and her arm was twisting in a writhing motion.  She finally grabbed her left wrist with her right hand to bring it down.  She continue to hold it down.  She needed help to put her bra on her shirt.  She went about her day.  She states that when she went out to lunch later on that afternoon, she had trouble holding onto her glass and spilled her drink.  She then went to her friend's house to play cards.  She was unable to hold the cards in her left hand.  Her friends made her come to the ER for evaluation. ? ?Patient states that she has been having numbness and tingling in her rubberlike sensation of her skin since early this morning. ? ?Patient denies any slurred speech, any flaccid paralysis of her left arm, facial droop, difficulty walking.  Patient states that she has never had a seizure before.  She had a TIA in the past has been on Plavix for at least 10 to 15 years. ? ?On arrival to the ER temp 90.7 heart rate 103 blood pressure 120/88. ? ?Labs showed a sodium 140, BUN of 32, creatinine 1.39 ? ?White count 8.4, he 113.5, platelets 224. ? ?CT head demonstrated no acute intracranial abnormality. ?CT angio head and neck showed no large vessel occlusion. ? ?Telemetry stroke neurologist was consulted. ? ?Patient transferred to [facility] for further evaluation. ?

## 2021-10-09 NOTE — Plan of Care (Signed)
?  Problem: Education: ?Goal: Knowledge of disease or condition will improve ?Outcome: Progressing ?Goal: Knowledge of secondary prevention will improve (SELECT ALL) ?Outcome: Progressing ?Goal: Knowledge of patient specific risk factors will improve (INDIVIDUALIZE FOR PATIENT) ?Outcome: Progressing ?Goal: Individualized Educational Video(s) ?Outcome: Progressing ?  ?Problem: Coping: ?Goal: Will verbalize positive feelings about self ?Outcome: Progressing ?Goal: Will identify appropriate support needs ?Outcome: Progressing ?  ?Problem: Health Behavior/Discharge Planning: ?Goal: Ability to manage health-related needs will improve ?Outcome: Progressing ?  ?Problem: Self-Care: ?Goal: Ability to participate in self-care as condition permits will improve ?Outcome: Progressing ?Goal: Verbalization of feelings and concerns over difficulty with self-care will improve ?Outcome: Progressing ?Goal: Ability to communicate needs accurately will improve ?Outcome: Progressing ?  ?Problem: Nutrition: ?Goal: Risk of aspiration will decrease ?Outcome: Progressing ?Goal: Dietary intake will improve ?Outcome: Progressing ?  ?Problem: Intracerebral Hemorrhage Tissue Perfusion: ?Goal: Complications of Intracerebral Hemorrhage will be minimized ?Outcome: Progressing ?  ?Problem: Ischemic Stroke/TIA Tissue Perfusion: ?Goal: Complications of ischemic stroke/TIA will be minimized ?Outcome: Progressing ?  ?Problem: Spontaneous Subarachnoid Hemorrhage Tissue Perfusion: ?Goal: Complications of Spontaneous Subarachnoid Hemorrhage will be minimized ?Outcome: Progressing ?  ?Problem: Education: ?Goal: Knowledge of disease or condition will improve ?Outcome: Progressing ?Goal: Knowledge of secondary prevention will improve (SELECT ALL) ?Outcome: Progressing ?Goal: Knowledge of patient specific risk factors will improve (INDIVIDUALIZE FOR PATIENT) ?Outcome: Progressing ?Goal: Individualized Educational Video(s) ?Outcome: Progressing ?  ?Problem:  Coping: ?Goal: Will verbalize positive feelings about self ?Outcome: Progressing ?Goal: Will identify appropriate support needs ?Outcome: Progressing ?  ?Problem: Health Behavior/Discharge Planning: ?Goal: Ability to manage health-related needs will improve ?Outcome: Progressing ?  ?Problem: Self-Care: ?Goal: Ability to participate in self-care as condition permits will improve ?Outcome: Progressing ?Goal: Verbalization of feelings and concerns over difficulty with self-care will improve ?Outcome: Progressing ?Goal: Ability to communicate needs accurately will improve ?Outcome: Progressing ?  ?Problem: Nutrition: ?Goal: Risk of aspiration will decrease ?Outcome: Progressing ?Goal: Dietary intake will improve ?Outcome: Progressing ?  ?Problem: Intracerebral Hemorrhage Tissue Perfusion: ?Goal: Complications of Intracerebral Hemorrhage will be minimized ?Outcome: Progressing ?  ?Problem: Ischemic Stroke/TIA Tissue Perfusion: ?Goal: Complications of ischemic stroke/TIA will be minimized ?Outcome: Progressing ?  ?Problem: Spontaneous Subarachnoid Hemorrhage Tissue Perfusion: ?Goal: Complications of Spontaneous Subarachnoid Hemorrhage will be minimized ?Outcome: Progressing ?  ?

## 2021-10-09 NOTE — Discharge Summary (Signed)
?Physician Discharge Summary ?  ?Patient: Ebony [name] MRN: [id number] DOB: 03-09-1941  ?Admit date:     10/08/2021  ?Discharge date: 10/09/21  ?Discharge Physician: Patrecia Pour  ? ?PCP: Velna Hatchet, MD  ? ?Recommendations at discharge:  ?Follow up with cardiology as scheduled for wound recheck s/p ILR 10/09/2021 ?Follow up with PCP for HTN management ?Follow up with neurology in 1-2 months for stroke hospitalization follow up. ?Prescribed DAPT x3 weeks, then return to plavix '75mg'$  alone. ? ?Discharge Diagnoses: ?Principal Problem: ?  CVA (cerebral vascular accident) ([place]) ?Active Problems: ?  Chronic kidney disease, stage 3a ([place]) ?  Essential hypertension ?  Gastro-esophageal reflux disease without esophagitis ?  Hyperlipidemia ? ? ?Hospital Course: ?Ebony [name] is an 81 y.o. female with history of TIA, CKD stage IIIa, hypertension, reflux, history of spinal stenosis status post back surgery a few weeks ago presents to the ER today with left arm weakness starting around 8 AM on 3/21.  She was try to get changed in her bedroom after waking up and taking a shower and noticed that her left arm reached over her head and then she is staring at the palm of her left hand over her head.  She had not realized that her arm was over the top of her head at the time and she did not feel she had any control over it.  She stated that her arm was twisting in a with the range of motion.  She had to use her right hand to bring down her left arm.  She was unable to get dressed without assistance but then went about her day around lunchtime she was holding her left and spilled her drink.  She was unable to hold cards in her left hand when she returned to play cards at her friend's house.  Her friends decided to take her to the ER for an evaluation.  She reports having a numbness, tingling, rubber like sensation in her arm since about 8 AM on 321.  This improved once she arrived to the ED in the evening.  She states that she is  still having some "slowness" in her hand however she has control in her arm.  ? ?MRI shows an acute right parietal cortex infarct and small vessel ischemia and a small remote left cerebellar infarct. LDL is at goal on repatha, HbA1c 5.9%. Aspirin '81mg'$  was added to patient's PTA plavix '75mg'$ . PT and OT recommend outpatient PT as patient's deficit is mild. Due to concern for cardioembolic source not seen on transthoracic echocardiogram, a loop recorder was implanted prior to discharge.  ? ?Assessment and Plan: ?* CVA (cerebral vascular accident) ([place]) ?Acute right parietal cortical CVA, concerning for embolic source. No CES on echo. Also remote cerebellar infarct noted.  ?- LDL is 35, continue repatha (statin intolerant) ?- HbA1c 5.9%, PCP follow up recommended. No medication initiated for prediabetes.  ?- Follow up with neurology after discharge, monitor ILR for atrial fibrillation. Since none yet detected, will continue with DAPT. ? ?Hyperlipidemia ?On repatha. Not on statins due to myalgias. ? ?Gastro-esophageal reflux disease without esophagitis ?Stable. ? ?Essential hypertension ?Allow for permissive hypertension(SBP <220). ? ?Chronic kidney disease, stage 3a ([place]) ?- Recheck at follow up ? ?s/p back surgery: No longer taking pain medications. Wound is healed.  ?- Routine outpatient follow up.  ? ?Consultants: Neurology, Dr. Erlinda Hong. EP, Dr. Lovena Le ?Procedures performed: Loop recorder implantation 10/09/2021  ?Disposition: Home ?Diet recommendation:  ?Cardiac diet ?DISCHARGE MEDICATION: ?Allergies  as of 10/09/2021   ? ?   Reactions  ? Other Anaphylaxis  ? Muscle Relaxer-Unsure which one  ? Codeine Nausea And Vomiting  ? Statins Other (See Comments)  ?  myalgias  ? Sulfa Antibiotics Other (See Comments)  ? Red streaks on nose- ? cellulitis  ? ?  ? ?  ?Medication List  ?  ? ?STOP taking these medications   ? ?docusate sodium 100 MG capsule ?Commonly known as: Colace ?  ?oxyCODONE 5 MG immediate release tablet ?Commonly  known as: Oxy IR/ROXICODONE ?  ?polyethylene glycol 17 g packet ?Commonly known as: MIRALAX / GLYCOLAX ?  ? ?  ? ?TAKE these medications   ? ?acetaminophen 500 MG tablet ?Commonly known as: TYLENOL ?Take 1,000 mg by mouth every 6 (six) hours as needed for headache (pain). ?  ?allopurinol 100 MG tablet ?Commonly known as: ZYLOPRIM ?Take 100 mg by mouth every morning. ?  ?ALPRAZolam 0.5 MG tablet ?Commonly known as: Duanne Moron ?Take 0.5 mg by mouth at bedtime as needed for anxiety or sleep. ?  ?aspirin 81 MG EC tablet ?Take 1 tablet (81 mg total) by mouth daily for 21 days. ?Start taking on: October 10, 2021 ?  ?citalopram 20 MG tablet ?Commonly known as: CELEXA ?Take 20 mg by mouth every morning. ?  ?clopidogrel 75 MG tablet ?Commonly known as: PLAVIX ?Take 75 mg by mouth every morning. ?  ?Repatha 140 MG/ML [place] ?Generic drug: Evolocumab ?Inject 140 mg into the skin every 14 (fourteen) days. ?  ?terbinafine 250 MG tablet ?Commonly known as: LamISIL ?Take 1 tablet (250 mg total) by mouth daily. ?What changed: when to take this ?  ?triamterene-hydrochlorothiazide 37.5-25 MG capsule ?Commonly known as: DYAZIDE ?Take 1 capsule by mouth every morning. ?  ? ?  ? ? Follow-up Information   ? ? [facility] Follow up.   ?Why: on 4/5 at 10 am for post loop recorder wound check ?Contact information: ?[address] ?[place] [address] ?[phone] ? ?  ?  ? ? Velna Hatchet, MD. Schedule an appointment as soon as possible for a visit.   ?Specialty: Internal Medicine ?Contact information: ?[address] ?[place] [address] ?[phone] ? ? ?  ?  ? ? Garvin Fila, MD Follow up in 2 month(s).   ?Specialties: Neurology, Radiology ?Why: Stroke follow up ?Contact information: ?[address] ?[address] ?[place] [address] ?[phone] ? ? ?  ?  ? ?  ?  ? ?  ? ?Subjective: Still some awkwardness with the left hand and leg. No new deficits developing.  Would love to go home ASAP.  ? ?Discharge Exam: ?Filed Weights  ? 10/08/21 1920 10/09/21 0110  ?Weight: 86.2 kg 86.9 kg  ?Elderly, pleasant, interactive female in no distress ?Clear, nonlabored ?RRR, no MRG or edema ?Tele: NSR without atrial ectopy ?4+/5 strength in grip on left, normal, if a bit slowed FTN. ? ?Condition at discharge: stable ? ?The results of significant diagnostics from this hospitalization (including imaging, microbiology, ancillary and laboratory) are listed below for reference.  ? ?Imaging Studies: ?CT Angio Head W or Wo Contrast ? ?Result Date: 10/08/2021 ?CLINICAL DATA:  Stroke follow-up EXAM: CT ANGIOGRAPHY HEAD AND NECK TECHNIQUE: Multidetector CT imaging of the head and neck was performed using the standard protocol during bolus administration of intravenous contrast. Multiplanar CT image reconstructions and MIPs were obtained to evaluate the vascular anatomy. Carotid stenosis measurements (when applicable) are obtained utilizing NASCET criteria,  using the distal internal carotid diameter as the denominator. RADIATION DOSE REDUCTION: This exam was performed according to the departmental dose-optimization program which includes automated exposure control, adjustment of the mA and/or kV according to patient size and/or use of iterative reconstruction technique. CONTRAST:  42m OMNIPAQUE IOHEXOL 350 MG/ML SOLN COMPARISON:  None. FINDINGS: CTA NECK FINDINGS SKELETON: There is no bony spinal canal stenosis. No lytic or blastic lesion. OTHER NECK: Normal pharynx, larynx and major salivary glands. No cervical lymphadenopathy. Unremarkable thyroid gland. UPPER CHEST: No pneumothorax or pleural effusion. No nodules or masses. AORTIC ARCH: There is no calcific atherosclerosis of the aortic arch. There is no aneurysm, dissection or hemodynamically significant stenosis of the visualized portion of the aorta. Conventional 3 vessel aortic branching pattern. The visualized proximal subclavian arteries are  widely patent. RIGHT CAROTID SYSTEM: Normal without aneurysm, dissection or stenosis. LEFT CAROTID SYSTEM: Normal without aneurysm, dissection or stenosis. VERTEBRAL ARTERIES: Left dominant configuration.

## 2021-10-09 NOTE — Evaluation (Signed)
Physical Therapy Evaluation ?Patient Details ?Name: Ebony [name] ?MRN: [id number] ?DOB: 1941-04-25 ?Today's Date: 10/09/2021 ? ?History of Present Illness ? 81 y/o female admitted due to L arm weakness and sensation deficits. 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 PMH: HTN, basal cell cancer, TIA, osteopenia, heart murmur, lumbar decompressive sx 09/12/21.  ?Clinical Impression ? Pt admitted secondary to problem above with deficits below. Pt requiring supervision to min guard A for mobility tasks this session for safety. Mild instability noted when performing DGI tasks. Also noted mild functional weakness in LLE. Feel pt would benefit from outpatient PT to address current deficits. Will defer further skilled PT needs to outpatient setting. No further acute skilled PT needs. Will sign off. If needs change, please re-consult.  ?   ? ?Recommendations for follow up therapy are one component of a multi-disciplinary discharge planning process, led by the attending physician.  Recommendations may be updated based on patient status, additional functional criteria and insurance authorization. ? ?Follow Up Recommendations Outpatient PT (once cleared by surgeon) ? ?  ?Assistance Recommended at Discharge Intermittent Supervision/Assistance  ?Patient can return home with the following ? Assist for transportation;Assistance with cooking/housework ? ?  ?Equipment Recommendations None recommended by PT  ?Recommendations for Other Services ?    ?  ?Functional Status Assessment Patient has had a recent decline in their functional status and demonstrates the ability to make significant improvements in function in a reasonable and predictable amount of time.  ? ?  ?Precautions / Restrictions Precautions ?Precautions: Fall;Back ?Precaution Comments: Recent back sx 2/23; no brace needed per chart. ?Restrictions ?Weight Bearing Restrictions: No  ? ?   ? ?Mobility ? Bed Mobility ?Overal bed mobility: Modified Independent ?  ?  ?  ?  ?  ?  ?  ?  ? ?Transfers ?Overall transfer level: Needs assistance ?Equipment used: None ?Transfers: Sit to/from Stand ?Sit to Stand: Supervision ?  ?  ?  ?  ?  ?General transfer comment: Supervision for safety. ?  ? ?Ambulation/Gait ?Ambulation/Gait assistance: Supervision, Min guard ?Gait Distance (Feet): 120 Feet ?Assistive device: None ?Gait Pattern/deviations: Step-through pattern, Decreased stride length ?Gait velocity: Decreased ?  ?  ?General Gait Details: Mild instability with DGI tasks noted. Requiring up to min guard A for safety. ? ?Stairs ?  ?  ?  ?  ?  ? ?Wheelchair Mobility ?  ? ?Modified Rankin (Stroke Patients Only) ?  ? ?  ? ?Balance Overall balance assessment: Mild deficits observed, not formally tested ?  ?  ?  ?  ?  ?  ?  ?  ?  ?  ?  ?  ?  ?  ?  ?Standardized Balance Assessment ?Standardized Balance Assessment : Dynamic Gait Index ?  ?Dynamic Gait Index ?Level Surface: Mild Impairment ?Change in Gait Speed: Normal ?Gait with Horizontal Head Turns: Normal ?Gait with Vertical Head Turns: Mild Impairment ?Gait and Pivot Turn: Mild Impairment ?Step Over Obstacle: Mild Impairment ?Step Around Obstacles: Mild Impairment ?   ? ? ? ?Pertinent Vitals/Pain    ? ? ?Home Living Family/patient expects to be discharged to:: Private residence ?Living Arrangements: Other relatives (grandson) ?Available Help at Discharge: Family ?Type of Home: House ?Home Access: Level entry ?  ?  ?  ?Home Layout: One level ?Home Equipment: Rollator (4 wheels);Rolling Walker (2 wheels) ?   ?  ?Prior Function Prior Level of Function : Independent/Modified Independent;Driving ?  ?  ?  ?  ?  ?  ?  Mobility Comments: using rollator at times due to pain prior to back surgery. no use of AD since back surgery. pt also denies any falls since sx ?ADLs Comments: Independent with ADLs, IADLs, driving. since back sx, grandson has been carrying in groceries,  etc ?  ? ? ?Hand Dominance  ? Dominant Hand: Right ? ?  ?Extremity/Trunk Assessment  ? Upper Extremity Assessment ?Upper Extremity Assessment: Defer to OT evaluation ?  ? ?Lower Extremity Assessment ?Lower Extremity Assessment: Generalized weakness;LLE deficits/detail ?LLE Deficits / Details: functional weakness noted in LLE with slight trendelenberg ?  ? ?Cervical / Trunk Assessment ?Cervical / Trunk Assessment: Back Surgery  ?Communication  ? Communication: No difficulties  ?Cognition Arousal/Alertness: Awake/alert ?Behavior During Therapy: [facility] for tasks assessed/performed ?Overall Cognitive Status: Within Functional Limits for tasks assessed ?  ?  ?  ?  ?  ?  ?  ?  ?  ?  ?  ?  ?  ?  ?  ?  ?General Comments: appears WFL though pt did not recall back precautions nor seeing therapy s/p back sx ?  ?  ? ?  ?General Comments General comments (skin integrity, edema, etc.): Grandson present ? ?  ?Exercises    ? ?Assessment/Plan  ?  ?PT Assessment All further PT needs can be met in the next venue of care  ?PT Problem List Decreased balance;Decreased strength ? ?   ?  ?PT Treatment Interventions     ? ?PT Goals (Current goals can be found in the Care Plan section)  ?Acute Rehab PT Goals ?Patient Stated Goal: to go home ?PT Goal Formulation: With patient ?Time For Goal Achievement: 10/09/21 ?Potential to Achieve Goals: Good ? ?  ?Frequency   ?  ? ? ?Co-evaluation   ?  ?  ?  ?  ? ? ?  ?AM-PAC PT "6 Clicks" Mobility  ?Outcome Measure Help needed turning from your back to your side while in a flat bed without using bedrails?: None ?Help needed moving from lying on your back to sitting on the side of a flat bed without using bedrails?: None ?Help needed moving to and from a bed to a chair (including a wheelchair)?: None ?Help needed standing up from a chair using your arms (e.g., wheelchair or bedside chair)?: None ?Help needed to walk in hospital room?: A Little ?Help needed climbing 3-5 steps with a railing? : A Little ?6  Click Score: 22 ? ?  ?End of Session Equipment Utilized During Treatment: Gait belt ?Activity Tolerance: Patient tolerated treatment well ?Patient left: in bed;with call bell/phone within reach;with family/visitor present ?Nurse Communication: Mobility status ?PT Visit Diagnosis: Unsteadiness on feet (R26.81) ?  ? ?Time: 7034-0352 ?PT Time Calculation (min) (ACUTE ONLY): 17 min ? ? ?Charges:   PT Evaluation ?$PT Eval Low Complexity: 1 Low ?  ?  ?   ? ? ?Reuel Derby, PT, DPT  ?Acute Rehabilitation Services  ?Pager: [phone] ?Office: [phone] ? ? ?[place] ?10/09/2021, 2:58 PM ? ?

## 2021-10-09 NOTE — Progress Notes (Signed)
Bilateral lower extremity venous duplex completed. ?Refer to "CV Proc" under chart review to view preliminary results. ? ?10/09/2021 10:16 AM ?Kelby Aline., MHA, RVT, RDCS, RDMS   ?

## 2021-10-09 NOTE — Evaluation (Signed)
Occupational Therapy Evaluation ?Patient Details ?Name: Ebony [name] ?MRN: [id number] ?DOB: 20-May-1941 ?Today's Date: 10/09/2021 ? ? ?History of Present Illness 81 y/o female admitted due to L arm weakness and sensation deficits. 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 PMH: HTN, basal cell cancer, TIA, osteopenia, heart murmur, lumbar decompressive sx 09/12/21.  ? ?Clinical Impression ?  ?PTA, pt lives with grandson and reports Independence in ADLs, IADLs and mobility without use of AD since recent back surgery. Pt presents now with reported improving symptoms since yesterday though mild L UE deficits in sensation/coordination still noted. Pt received going back to bed after independent bathroom mobility, denies any difficulty with this task. BUE strength 4+/5 and symmetrical. Educated re: fine motor coordination tasks and safety precautions for sensation deficits with handouts provided. See UE Assessment section for details. Anticipate good return of L UE function without need for OT follow-up at DC at this time. Will follow acutely for L UE sensation/coordination improvements.  ?   ? ?Recommendations for follow up therapy are one component of a multi-disciplinary discharge planning process, led by the attending physician.  Recommendations may be updated based on patient status, additional functional criteria and insurance authorization.  ? ?Follow Up Recommendations ? No OT follow up  ?  ?Assistance Recommended at Discharge PRN  ?Patient can return home with the following   ? ?  ?Functional Status Assessment ? Patient has had a recent decline in their functional status and demonstrates the ability to make significant improvements in function in a reasonable and predictable amount of time.  ?Equipment Recommendations ? None recommended by OT  ?  ?Recommendations for Other Services   ? ? ?  ?Precautions / Restrictions  Precautions ?Precautions: Fall;Back ?Precaution Comments: Recent back sx 2/23; no brace needed per chart. ?Restrictions ?Weight Bearing Restrictions: No  ? ?  ? ?Mobility Bed Mobility ?Overal bed mobility: Modified Independent ?  ?  ?  ?  ?  ?  ?General bed mobility comments: returning to bed after bathroom mobility on entry ?  ? ?Transfers ?  ?  ?  ?  ?  ?  ?  ?  ?  ?General transfer comment: received getting back into bed after bathroom mobility ?  ? ?  ?Balance Overall balance assessment: No apparent balance deficits (not formally assessed) ?  ?  ?  ?  ?  ?  ?  ?  ?  ?  ?  ?  ?  ?  ?  ?  ?  ?  ?   ? ?ADL either performed or assessed with clinical judgement  ? ?ADL Overall ADL's : Needs assistance/impaired ?Eating/Feeding: Independent;Sitting ?  ?Grooming: Independent;Standing ?  ?Upper Body Bathing: Independent;Sitting ?  ?Lower Body Bathing: Sit to/from stand;Sitting/lateral leans;Modified independent ?  ?Upper Body Dressing : Independent;Sitting ?  ?Lower Body Dressing: Sit to/from stand;Sitting/lateral leans;Modified independent ?  ?Toilet Transfer: Ambulation;Independent ?Toilet Transfer Details (indicate cue type and reason): exiting bathroom without AD and without assist on entry to return to bed. Per RN, they were present for pt getting to bathroom without safety concerns noted ?Toileting- Clothing Manipulation and Hygiene: Modified independent;Sit to/from stand ?Toileting - Clothing Manipulation Details (indicate cue type and reason): exiting bathroom without assist on entry ?  ?  ?  ?General ADL Comments: Pt with L UE sensation/coordination deficits though improving since yesterday. Educated re: sensation precautions and fine motor coordination activities to improve UE  function. Pt's grandson present, able to assist as needed at home  ? ? ? ?Vision Baseline Vision/History: 1 Wears glasses ?Ability to See in Adequate Light: 0 Adequate ?Patient Visual Report: No change from baseline ?Vision Assessment?: No  apparent visual deficits  ?   ?Perception   ?  ?Praxis   ?  ? ?Pertinent Vitals/Pain Pain Assessment ?Pain Assessment: No/denies pain  ? ? ? ?Hand Dominance Right ?  ?Extremity/Trunk Assessment Upper Extremity Assessment ?Upper Extremity Assessment: LUE deficits/detail ?LUE Deficits / Details: reports sensation deficits still present (pins and needles; "like it was asleep but waking up") and minor coordination deficits d/t this. Pt reports prior to coming to hospital, difficulty managing bra, pants buttons, holding phone. Currently, pt able to hold phone, open small containers and oppose digits easily. Strength symmetrical 4+/5. Provided fine motor coordination activities handout, as well as sensation/feeling loss handout and education to maximize safety and return of function ?LUE Sensation: decreased light touch ?LUE Coordination: decreased fine motor ?  ?Lower Extremity Assessment ?Lower Extremity Assessment: Defer to PT evaluation ?  ?Cervical / Trunk Assessment ?Cervical / Trunk Assessment: Normal;Back Surgery ?  ?Communication Communication ?Communication: No difficulties ?  ?Cognition Arousal/Alertness: Awake/alert ?Behavior During Therapy: [facility] for tasks assessed/performed ?Overall Cognitive Status: Within Functional Limits for tasks assessed ?  ?  ?  ?  ?  ?  ?  ?  ?  ?  ?  ?  ?  ?  ?  ?  ?General Comments: appears WFL though pt did not recall back precautions nor seeing therapy s/p back sx ?  ?  ?General Comments  Grandson present and supportive. ? ?  ?Exercises   ?  ?Shoulder Instructions    ? ? ?Home Living Family/patient expects to be discharged to:: Private residence ?Living Arrangements: Other relatives (grandson) ?Available Help at Discharge: Family ?Type of Home: House ?Home Access: Level entry ?  ?  ?Home Layout: One level ?  ?  ?Bathroom Shower/Tub: Walk-in shower ?  ?Bathroom Toilet: Handicapped height ?  ?  ?Home Equipment: Rollator (4 wheels);Rolling Walker (2 wheels) ?  ?  ?  ? ?  ?Prior  Functioning/Environment Prior Level of Function : Independent/Modified Independent;Driving ?  ?  ?  ?  ?  ?  ?Mobility Comments: using rollator at times due to pain prior to back surgery. no use of AD since back surgery. pt also denies any falls since sx ?ADLs Comments: Independent with ADLs, IADLs, driving. since back sx, grandson has been carrying in groceries, etc ?  ? ?  ?  ?OT Problem List: Decreased coordination;Impaired sensation ?  ?   ?OT Treatment/Interventions: Self-care/ADL training;Therapeutic exercise;DME and/or AE instruction;Therapeutic activities;Patient/family education;Neuromuscular education  ?  ?OT Goals(Current goals can be found in the care plan section) Acute Rehab OT Goals ?Patient Stated Goal: for L UE to return to normal ?OT Goal Formulation: With patient ?Time For Goal Achievement: 10/23/21 ?Potential to Achieve Goals: Good  ?OT Frequency: Min 2X/week ?  ? ?Co-evaluation   ?  ?  ?  ?  ? ?  ?AM-PAC OT "6 Clicks" Daily Activity     ?Outcome Measure Help from another person eating meals?: None ?Help from another person taking care of personal grooming?: None ?Help from another person toileting, which includes using toliet, bedpan, or urinal?: None ?Help from another person bathing (including washing, rinsing, drying)?: None ?Help from another person to put on and taking off regular upper body clothing?: None ?Help from another person  to put on and taking off regular lower body clothing?: None ?6 Click Score: 24 ?  ?End of Session Nurse Communication: Mobility status ? ?Activity Tolerance: Patient tolerated treatment well ?Patient left: in bed;with call bell/phone within reach;with family/visitor present ? ?OT Visit Diagnosis: Other (comment);Muscle weakness (generalized) (M62.81)  ?              ?Time: 8242-3536 ?OT Time Calculation (min): 16 min ?Charges:  OT General Charges ?$OT Visit: 1 Visit ?OT Evaluation ?$OT Eval Low Complexity: 1 Low ? ?Malachy Chamber, OTR/L ?Acute Rehab Services ?Office:  [phone]  ? ?Layla Maw ?10/09/2021, 9:06 AM ?

## 2021-10-09 NOTE — Assessment & Plan Note (Addendum)
Acute right parietal cortical CVA, concerning for embolic source. No CES on echo. Also remote cerebellar infarct noted.  ?- LDL is 35, continue repatha (statin intolerant) ?- HbA1c 5.9%, PCP follow up recommended. No medication initiated for prediabetes.  ?- Follow up with neurology after discharge, monitor ILR for atrial fibrillation. Since none yet detected, will continue with DAPT. ?

## 2021-10-09 NOTE — H&P (Addendum)
?History and Physical  ? ? ?Ebony [name] UQJ:[id number] DOB: March 25, 1941 DOA: 10/08/2021 ? ?DOS: the patient was seen and examined on 10/08/2021 ? ?PCP: Velna Hatchet, MD  ? ?Patient coming from: Home ? ?I have personally briefly reviewed patient's old medical records in [place] ? ?CC: left arm weakness ?HPI: ?81 yo WF with history of TIA, CKD stage IIIa, hypertension, reflux, history of spinal stenosis status post back surgery presents to the ER today with left arm weakness starting around 8 AM.  Patient states that she got out of bed this morning.  She got out of the shower.  She was try to get changed in her bedroom.  She noticed that her left arm had reached over her head and she was staring at the palm of her left hand over her head.  She did not realize that her arm was over the top of her head at that time.  She states that she did not have any control over it and her arm was twisting in a writhing motion.  She finally grabbed her left wrist with her right hand to bring it down.  She continue to hold it down.  She needed help to put her bra on her shirt.  She went about her day.  She states that when she went out to lunch later on that afternoon, she had trouble holding onto her glass and spilled her drink.  She then went to her friend's house to play cards.  She was unable to hold the cards in her left hand.  Her friends made her come to the ER for evaluation. ? ?Patient states that she has been having numbness and tingling in her rubberlike sensation of her skin since early this morning. ? ?Patient denies any slurred speech, any flaccid paralysis of her left arm, facial droop, difficulty walking.  Patient states that she has never had a seizure before.  She had a TIA in the past has been on Plavix for at least 10 to 15 years. ? ?On arrival to the ER temp 90.7 heart rate 103 blood pressure 120/88. ? ?Labs showed a sodium 140, BUN of 32, creatinine 1.39 ? ?White count 8.4, he 113.5, platelets  224. ? ?CT head demonstrated no acute intracranial abnormality. ?CT angio head and neck showed no large vessel occlusion. ? ?Telemetry stroke neurologist was consulted. ? ?Patient transferred to [facility] for further evaluation.  ? ?ED Course: CT head negative, CTA head/neck negative for LVO ? ?Review of Systems:  ?Review of Systems  ?Constitutional: Negative.   ?HENT: Negative.    ?Eyes: Negative.   ?Respiratory: Negative.    ?Cardiovascular: Negative.   ?Gastrointestinal: Negative.   ?Genitourinary: Negative.   ?Musculoskeletal: Negative.   ?Skin: Negative.   ?Neurological:  Positive for tingling.  ?     Chorea of the left upper extremity.  ?Endo/Heme/Allergies: Negative.   ?Psychiatric/Behavioral: Negative.    ?All other systems reviewed and are negative. ? ?Past Medical History:  ?Diagnosis Date  ? Arthritis   ? Cancer [facility])   ? BASAL CELL CA  ? Depression   ? GERD (gastroesophageal reflux disease)   ? TAKES TUMS PRN  ? Heart murmur   ? "childhood" - no follow up as an adult  ? Hypertension   ? Osteopenia   ? Shortness of breath dyspnea   ? WITH EXERTION  ? Stroke [facility])   ? 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 Tendency toward bleeding easily ([place])   ? ? ?  Past Surgical History:  ?Procedure Laterality Date  ? BUNIONECTOMY Right   ? CARDIAC CATHETERIZATION    ? CATARACT EXTRACTION Bilateral   ? KNEE ARTHROPLASTY Right 11/29/2015  ? Procedure: RIGHT TOTAL KNEE ARTHROPLASTY ;  Surgeon: Rod Can, MD;  Location: [facility];  Service: Orthopedics;  Laterality: Right;  ? KNEE ARTHROSCOPY Right 10/05/2013  ? Procedure: RIGHT KNEE ARTHROSCOPY WITH medial and lateral DEBRIDEMENT chrondroplasty;  Surgeon: [name], MD;  Location: [facility] OR[facility];  Service: Orthopedics;  Laterality: Right;  ? LUMBAR LAMINECTOMY/DECOMPRESSION MICRODISCECTOMY N/A 09/12/2021  ? Procedure: Lumbar decompression Lumbar three - Lumbar four and lumbar four-five;  Surgeon: Susa Day, MD;  Location: [place];  Service: Orthopedics;  Laterality: N/A;  ? MOHS  SURGERY ON NOSE 01/2007, MOHS ON FACE 08/2012    ? TONSILLECTOMY    ? ? ? reports that she quit smoking about 50 years ago. Her smoking use included cigarettes. She has a 10.00 pack-year smoking history. She has never used smokeless tobacco. She reports current alcohol use. She reports that she does not use drugs. ? ?Allergies  ?Allergen Reactions  ? Other Anaphylaxis  ?  Muscle Relaxer-Unsure which one  ? Codeine Nausea And Vomiting  ? Statins   ?  Other reaction(s): myalgias  ? Sulfa Antibiotics Other (See Comments)  ?  Red streaks on nose- ? cellulitis  ? ? ?No family history on file. ? ?Prior to Admission medications   ?Medication Sig Start Date End Date Taking? Authorizing Provider  ?clopidogrel (PLAVIX) 75 MG tablet Take 75 mg by mouth daily.   Yes [provider]  ?allopurinol (ZYLOPRIM) 100 MG tablet Take 200 mg by mouth daily. 11/23/18   [provider]  ?ALPRAZolam Duanne Moron) 0.5 MG tablet Take 0.5 mg by mouth at bedtime as needed for anxiety or sleep.  07/19/14   [provider]  ?citalopram (CELEXA) 20 MG tablet Take 20 mg by mouth daily. 11/07/15   [provider]  ?docusate sodium (COLACE) 100 MG capsule Take 1 capsule (100 mg total) by mouth 2 (two) times daily as needed for mild constipation. 09/12/21   Susa Day, MD  ?morphine (MSIR) 15 MG tablet Take 15 mg by mouth 3 (three) times daily as needed for moderate pain. 01/14/19   [provider]  ?oxyCODONE (OXY IR/ROXICODONE) 5 MG immediate release tablet Take 1 tablet (5 mg total) by mouth every 4 (four) hours as needed for severe pain. 09/12/21   Susa Day, MD  ?polyethylene glycol (MIRALAX / GLYCOLAX) 17 g packet Take 17 g by mouth daily. 09/12/21   Susa Day, MD  ?REPATHA SURECLICK 496 MG/ML SOAJ Inject 140 mg into the skin every 14 (fourteen) days. 11/26/20   [provider]  ?terbinafine (LAMISIL) 250 MG tablet Take 1 tablet (250 mg total) by mouth daily. ?Patient taking differently: Take  250 mg by mouth at bedtime. 07/30/21   Hyatt, Max T, DPM  ?triamterene-hydrochlorothiazide (DYAZIDE) 37.5-25 MG capsule Take 1 capsule by mouth daily. 11/07/15   [provider]  ? ? ?Physical Exam: ?Vitals:  ? 10/08/21 2200 10/08/21 2330 10/09/21 0000 10/09/21 0110  ?BP: 136/65 (!) 148/79 (!) 146/71 (!) 157/77  ?Pulse: 67 64 70 73  ?Resp: (!) 22 (!) 24 (!) 27 17  ?Temp:    98.5 ?F (36.9 ?C)  ?TempSrc:    Oral  ?SpO2: 97% 95% 96% 97%  ?Weight:    86.9 kg  ?Height:    '5\' 6"'$  (1.676 m)  ? ? ?Physical  Exam ?Vitals and nursing note reviewed.  ?Constitutional:   ?   General: She is not in acute distress. ?   Appearance: Normal appearance. She is obese. She is not ill-appearing, toxic-appearing or diaphoretic.  ?HENT:  ?   Head: Normocephalic and atraumatic.  ?   Nose: Nose normal. No rhinorrhea.  ?Cardiovascular:  ?   Rate and Rhythm: Normal rate and regular rhythm.  ?   Pulses: Normal pulses.  ?Pulmonary:  ?   Effort: Pulmonary effort is normal. No respiratory distress.  ?   Breath sounds: Normal breath sounds. No wheezing or rales.  ?Abdominal:  ?   General: Abdomen is protuberant. Bowel sounds are normal. There is no distension.  ?   Palpations: Abdomen is soft.  ?   Tenderness: There is no abdominal tenderness. There is no guarding.  ?Musculoskeletal:  ?   Right lower leg: No edema.  ?   Left lower leg: No edema.  ?Skin: ?   General: Skin is warm and dry.  ?   Capillary Refill: Capillary refill takes less than 2 seconds.  ?Neurological:  ?   Mental Status: She is alert and oriented to person, place, and time.  ?   Motor: Pronator drift present.  ?   Coordination: Romberg sign positive. Finger-Nose-Finger Test abnormal. Heel to Shin Test normal.  ?   Comments: Left side abnormal finger-to-nose ?+pronator drift left arm ? ?Normal hand grip right and left hand ?Normal bicep strength right and left  ?  ? ?Labs on Admission: I have personally reviewed following labs and imaging studies ? ?CBC: ?Recent Labs  ?Lab  10/08/21 ?1936  ?WBC 8.4  ?NEUTROABS 4.8  ?HGB 13.5  ?HCT 40.4  ?MCV 100.5*  ?PLT 224  ? ?Basic Metabolic Panel: ?Recent Labs  ?Lab 10/08/21 ?1936  ?NA 140  ?K 3.5  ?CL 103  ?CO2 22  ?GLUCOSE 130*  ?BUN 32*  ?CREA

## 2021-10-10 ENCOUNTER — Encounter (HOSPITAL_COMMUNITY): Payer: Self-pay | Admitting: Internal Medicine

## 2021-10-23 ENCOUNTER — Ambulatory Visit (INDEPENDENT_AMBULATORY_CARE_PROVIDER_SITE_OTHER): Payer: Medicare PPO

## 2021-10-23 DIAGNOSIS — I63411 Cerebral infarction due to embolism of right middle cerebral artery: Secondary | ICD-10-CM

## 2021-10-23 LAB — CUP PACEART INCLINIC DEVICE CHECK
Date Time Interrogation Session: 20230405102158
Implantable Pulse Generator Implant Date: 20230322

## 2021-10-23 NOTE — Patient Instructions (Signed)
? ?  After Your Pacemaker ? ? ?Monitor your pacemaker site for redness, swelling, and drainage. Call the device clinic at [phone] if you experience these symptoms or fever/chills. ? ?

## 2021-10-23 NOTE — Progress Notes (Signed)
ILR wound check in clinic. Steri strips removed. Wound well healed. Home monitor transmitting nightly. No episodes. Questions answered.  

## 2021-10-24 ENCOUNTER — Other Ambulatory Visit: Payer: Self-pay

## 2021-10-24 NOTE — Patient Outreach (Signed)
Received a red flag Emmi stroke notification for Ms. Ebony [name] . ?I have assigned Jon Billings, RN to call for follow up and determine if there are any Case Management needs.  ?  ?Arville Care, CBCS, CMAA ?[place] Management Assistant ?[place] Management ?[phone]   ?

## 2021-10-24 NOTE — Patient Outreach (Signed)
[facility]) Care Management ? ?10/24/2021 ? ?Colleen Can ?1941-07-19 ?[id number] ? ? ?EMMI- Stroke ?RED ON EMMI ALERT ?Day # 13 ?Date: 10/24/21 ?Red Alert Reason:  ?Martin Majestic to follow-up appointment?   No  ? ? ?Outreach attempt: No answer. HIPAA compliant voice message left.   ? ? ?Plan: ?RN CM will outreach within 4 business days. ? ?Jone Baseman, RN, MSN ?[facility] Care Management ?Care Management Coordinator ?Direct Line [phone] ?Toll Free: [phone]  ?Fax: [phone] ? ?

## 2021-10-28 ENCOUNTER — Other Ambulatory Visit: Payer: Self-pay

## 2021-10-28 NOTE — Patient Outreach (Signed)
[place] [facility]) Care Management ? ?10/28/2021 ? ?Colleen Can ?1941/04/10 ?[id number] ? ? ?EMMI- Stroke ?RED ON EMMI ALERT ?Day # 13 ?Date: 10/24/21 ?Red Alert Reason:  ?Martin Majestic to follow-up appointment?   No  ?  ?  ?Outreach attempt: Female answered stating patient could not talk right now.  CM advised to call another time.   ?  ?Plan: ?RN CM will outreach within 4 business days. ?  ?Jone Baseman, RN, MSN ?[facility] Care Management ?Care Management Coordinator ?Direct Line [phone] ?Toll Free: [phone]  ?Fax: [phone] ? ?

## 2021-10-29 ENCOUNTER — Other Ambulatory Visit: Payer: Medicare PPO | Admitting: Surgical

## 2021-10-31 ENCOUNTER — Other Ambulatory Visit: Payer: Self-pay

## 2021-10-31 NOTE — Patient Outreach (Signed)
[facility]) Care Management ? ?10/31/2021 ? ?Colleen Can ?1940-07-22 ?[id number] ? ? ?EMMI- Stroke ?RED ON EMMI ALERT ?Day # 13 ?Date: 10/24/21 ?Red Alert Reason:  ?Martin Majestic to follow-up appointment?   No  ?  ?  ?Outreach attempt: Female answered stating patient not available.  Identified as [facility] case manager for return call. ?  ?Plan: ?RN CM will outreach attempt again in the month of April. ? ?Jone Baseman, RN, MSN ?[facility] Care Management ?Care Management Coordinator ?Direct Line [phone] ?Toll Free: [phone]  ?Fax: [phone] ? ?

## 2021-11-01 ENCOUNTER — Ambulatory Visit (INDEPENDENT_AMBULATORY_CARE_PROVIDER_SITE_OTHER): Payer: Self-pay | Admitting: Surgical

## 2021-11-01 DIAGNOSIS — Z411 Encounter for cosmetic surgery: Secondary | ICD-10-CM

## 2021-11-01 NOTE — Progress Notes (Signed)
Preoperative Dx: Hyperpigmentation, sun damage, facial vessels ? ?Postoperative Dx:  same ? ?Procedure: laser to face ? ?Anesthesia: none ? ?Description of Procedure:  ?Risks and complications were explained to the patient. Consent was confirmed and signed. Time out was called and all information was confirmed to be correct. The area  area was prepped with alcohol and wiped dry. The BBL 560 nm laser was set at 7 J/cm?, the rectangular handpiece was used, the face was lasered excluding the forehead and nose.  The BBL 560 nanometers laser was set at 8 J/cm?, the square handpiece was used in the left nose and forehead were lasered.  The BBL 515 nm laser was set at 7 J/cm?, the rectangular handpiece was used, the face was lasered excluding the forehead and nose.  The BBL 515 nm laser was set at 8 J/cm? and the forehead and nose were lasered with the square handpiece.  ? ?The 4 mm spot treatment handpiece was then used, the laser was set at facial vessels.  Facial vessel on the right nose was lasered.  Patient tolerated this well. ? ?Patient tolerated the entire procedure well and there were no complications.  Recommend following up in 4 to 6 weeks.  Patient was counseled on postoperative care. ? ? ?

## 2021-11-04 DIAGNOSIS — F330 Major depressive disorder, recurrent, mild: Secondary | ICD-10-CM | POA: Diagnosis not present

## 2021-11-04 DIAGNOSIS — N1831 Chronic kidney disease, stage 3a: Secondary | ICD-10-CM | POA: Diagnosis not present

## 2021-11-04 DIAGNOSIS — I129 Hypertensive chronic kidney disease with stage 1 through stage 4 chronic kidney disease, or unspecified chronic kidney disease: Secondary | ICD-10-CM | POA: Diagnosis not present

## 2021-11-04 DIAGNOSIS — F419 Anxiety disorder, unspecified: Secondary | ICD-10-CM | POA: Diagnosis not present

## 2021-11-04 DIAGNOSIS — E785 Hyperlipidemia, unspecified: Secondary | ICD-10-CM | POA: Diagnosis not present

## 2021-11-04 DIAGNOSIS — I6523 Occlusion and stenosis of bilateral carotid arteries: Secondary | ICD-10-CM | POA: Diagnosis not present

## 2021-11-04 DIAGNOSIS — Z23 Encounter for immunization: Secondary | ICD-10-CM | POA: Diagnosis not present

## 2021-11-04 DIAGNOSIS — R0600 Dyspnea, unspecified: Secondary | ICD-10-CM | POA: Diagnosis not present

## 2021-11-04 DIAGNOSIS — R5383 Other fatigue: Secondary | ICD-10-CM | POA: Diagnosis not present

## 2021-11-05 ENCOUNTER — Other Ambulatory Visit: Payer: Self-pay

## 2021-11-05 DIAGNOSIS — D692 Other nonthrombocytopenic purpura: Secondary | ICD-10-CM | POA: Diagnosis not present

## 2021-11-05 DIAGNOSIS — D1801 Hemangioma of skin and subcutaneous tissue: Secondary | ICD-10-CM | POA: Diagnosis not present

## 2021-11-05 DIAGNOSIS — L570 Actinic keratosis: Secondary | ICD-10-CM | POA: Diagnosis not present

## 2021-11-05 DIAGNOSIS — L304 Erythema intertrigo: Secondary | ICD-10-CM | POA: Diagnosis not present

## 2021-11-05 DIAGNOSIS — L820 Inflamed seborrheic keratosis: Secondary | ICD-10-CM | POA: Diagnosis not present

## 2021-11-05 DIAGNOSIS — L821 Other seborrheic keratosis: Secondary | ICD-10-CM | POA: Diagnosis not present

## 2021-11-05 DIAGNOSIS — Z85828 Personal history of other malignant neoplasm of skin: Secondary | ICD-10-CM | POA: Diagnosis not present

## 2021-11-05 NOTE — Patient Outreach (Signed)
[facility] [facility]) Care Management ? ?11/05/2021 ? ?Colleen Can ?1940/08/20 ?[id number] ? ? ?EMMI- Stroke ?RED ON EMMI ALERT ?Day # 13 ?Date: 10/24/21 ?Red Alert Reason:  ?Martin Majestic to follow-up appointment?   No  ?  ?  ?Outreach attempt: Return call to patient.  Addressed red alert. Patient saw PCP on 11-04-21.  Patient needing neurology follow up information. Patient agreeable to receive information via my chart from CM.  CM briefly reviewed [facility] services and is agreeable to CM to call back for assessment.   ? ?Plan: RN CM will outreach within 4 business days.   ? ?Jone Baseman, RN, MSN ?[facility] Care Management ?Care Management Coordinator ?Direct Line [phone] ?Toll Free: [phone]  ?Fax: [phone] ? ? ?

## 2021-11-07 ENCOUNTER — Other Ambulatory Visit: Payer: Self-pay

## 2021-11-07 DIAGNOSIS — Z961 Presence of intraocular lens: Secondary | ICD-10-CM | POA: Diagnosis not present

## 2021-11-07 DIAGNOSIS — H524 Presbyopia: Secondary | ICD-10-CM | POA: Diagnosis not present

## 2021-11-07 DIAGNOSIS — H04123 Dry eye syndrome of bilateral lacrimal glands: Secondary | ICD-10-CM | POA: Diagnosis not present

## 2021-11-07 NOTE — Patient Outreach (Signed)
?[place] [facility]) Care Management ?Telephonic RN Care Manager Note ? ? ?11/07/2021 ?Name:  Ebony [name] MRN:  [id number] DOB:  05/06/1941 ? ?Summary: ?Telephone call to patient for initial disease management assessment. Patient reports she is doing good.  Patient lives in the home with family and friend at this time. Patient is independent with care and still drives.   ? ?Patient history of TIA, CKD stage IIIa, hypertension, reflux, history of spinal stenosis status post back surgery.  Patient had stroke like symptoms about 1 month ago.  She had a loop recorder placed during that time.  Patient reports doing well since then. She reports she she will be getting back to her exercise at [facility] where she walks the track.  Patient reports that her blood pressure has been doing well.  She admits to not checking it at home but at MD appointment it was 130/80.  Discussed hypertension management and importance of checking blood pressure periodically.   ? ?Discussed [facility] services with patient. She agrees to disease management outreach ongoing. ? ? ? ?Recommendations/Changes made from today's visit: ?Recommended self blood pressure checks weekly whether at home or drug store and record.  ? ?Subjective: ?Ebony [name] is an 81 y.o. year old female who is a primary patient of Velna Hatchet, MD. The care management team was consulted for assistance with care management and/or care coordination needs.   ? ?Telephonic RN Care Manager completed Telephone Visit today. ? ?Objective:  ? ?Medications Reviewed Today   ? ? Reviewed by Jon Billings, RN (Case Manager) on 11/07/21 at 80  Med List Status: <None>  ? ?Medication Order Taking? Sig Documenting Provider Last Dose Status Informant  ?acetaminophen (TYLENOL) 500 MG tablet [id number] Yes Take 1,000 mg by mouth every 6 (six) hours as needed for headache (pain). [provider] Taking Active Self, Multiple Informants  ?allopurinol (ZYLOPRIM) 100 MG tablet  [id number] Yes Take 100 mg by mouth every morning. [provider] Taking Active Self, Multiple Informants  ?ALPRAZolam (XANAX) 0.5 MG tablet [id number] Yes Take 0.5 mg by mouth at bedtime as needed for anxiety or sleep.  [provider] Taking Active Self, Multiple Informants  ?         ?Med Note Tinnie Gens   Wed Nov 14, 2015  9:06 AM)     ? ?  ?citalopram (CELEXA) 20 MG tablet [id number] Yes Take 20 mg by mouth every morning. [provider] Taking Active Self, Multiple Informants  ?         ?Med Note Tinnie Gens   Wed Nov 14, 2015  9:06 AM)      ? ?  ?clopidogrel (PLAVIX) 75 MG tablet [id number] Yes Take 75 mg by mouth every morning. [provider] Taking Active Self, Multiple Informants  ?Evolocumab (REPATHA) 140 MG/ML SOSY [id number] Yes Inject 140 mg into the skin every 14 (fourteen) days. [provider] Taking Active Self, Multiple Informants  ?terbinafine (LAMISIL) 250 MG tablet [id number] Yes Take 1 tablet (250 mg total) by mouth daily.  ?Patient taking differently: Take 250 mg by mouth at bedtime.  ? Hyatt, Max T, DPM Taking Active Self, Multiple Informants  ?triamterene-hydrochlorothiazide (DYAZIDE) 37.5-25 MG capsule [id number] Yes Take 1 capsule by mouth every morning. [provider] Taking Active Self, Multiple Informants  ?         ?Med Note Tinnie Gens   Wed Nov 14, 2015  9:08 AM)     ? ?  ? ?  ?  ? ?  ? ? ? ?  SDOH:  (Social Determinants of Health) assessments and interventions performed:  ? ? ? ?Care Plan ? ?Review of patient past medical history, allergies, medications, health status, including review of consultants reports, laboratory and other test data, was performed as part of comprehensive evaluation for care management services.  ? ?Care Plan : RN CM Plan of Care  ?Updates made by Jon Billings, RN since 11/07/2021 12:00 AM  ?  ? ?Problem: Chronic Disease Management and Care Coordination Needs Hypertension   ?Priority: High  ?   ? ?Long-Range Goal: Development of Plan of Care for Management of Hypertension   ?Start Date: 11/07/2021  ?Expected End Date: 07/20/2022  ?Priority: High  ?Note:   ?Current Barriers:  ?Chronic Disease Management support and education needs related to HTN  ? ?RNCM Clinical Goal(s):  ?Patient will verbalize understanding of plan for management of HTN as evidenced by Blood pressure reports less than 140/80  through collaboration with RN Care manager, provider, and care team.  ? ?Interventions: ?Education and support related to HTN ?Inter-disciplinary care team collaboration (see longitudinal plan of care) ?Evaluation of current treatment plan related to  self management and patient's adherence to plan as established by provider ? ? ?Hypertension Interventions:  (Status:  New goal.) Long Term Goal ?Last practice recorded BP readings:  ?BP Readings from Last 3 Encounters:  ?10/09/21 126/61  ?09/12/21 137/75  ?09/03/21 114/87  ?Most recent eGFR/CrCl: No results found for: EGFR  No components found for: CRCL ? ?Evaluation of current treatment plan related to hypertension self management and patient's adherence to plan as established by provider ?Provided education to patient re: stroke prevention, s/s of heart attack and stroke ?Reviewed medications with patient and discussed importance of compliance ?Discussed plans with patient for ongoing care management follow up and provided patient with direct contact information for care management team ? ?Patient Goals/Self-Care Activities: Hypertension ?Take all medications as prescribed ?Attend all scheduled provider appointments ?check blood pressure weekly ?write blood pressure results in a log or diary ?call doctor for signs and symptoms of high blood pressure ?take medications for blood pressure exactly as prescribed ? ?Follow Up Plan:  Telephone follow up appointment with care management team member scheduled for:  May ?The patient has been provided with contact information  for the care management team and has been advised to call with any health related questions or concerns.  ? ?  ? ? ? ?Plan:  Telephone follow up appointment with care management team member scheduled for:  May ?The patient has been provided with contact information for the care management team and has been advised to call with any health related questions or concerns.  ?RN CM will provide ongoing education and support to patient through phone calls.   ?RN CM will send welcome packet with consent to patient.   ?RN CM will send initial barriers letter, assessment, and care plan to primary care physician.   ?Patient agrees to care plan and follow up.    ? ?Jone Baseman, RN, MSN ?[facility] Care Management ?Care Management Coordinator ?Direct Line [phone] ?Toll Free: [phone]  ?Fax: [phone] ? ? ?

## 2021-11-07 NOTE — Patient Instructions (Signed)
Patient Goals/Self-Care Activities: Hypertension ?Take all medications as prescribed ?Attend all scheduled provider appointments ?check blood pressure weekly ?write blood pressure results in a log or diary ?call doctor for signs and symptoms of high blood pressure ?take medications for blood pressure exactly as prescribed ?

## 2021-11-11 ENCOUNTER — Ambulatory Visit (INDEPENDENT_AMBULATORY_CARE_PROVIDER_SITE_OTHER): Payer: Medicare PPO

## 2021-11-11 DIAGNOSIS — I63411 Cerebral infarction due to embolism of right middle cerebral artery: Secondary | ICD-10-CM

## 2021-11-12 ENCOUNTER — Ambulatory Visit: Payer: Medicare PPO | Admitting: Podiatry

## 2021-11-12 DIAGNOSIS — Z79899 Other long term (current) drug therapy: Secondary | ICD-10-CM

## 2021-11-12 DIAGNOSIS — M25511 Pain in right shoulder: Secondary | ICD-10-CM | POA: Diagnosis not present

## 2021-11-12 LAB — CUP PACEART REMOTE DEVICE CHECK
Date Time Interrogation Session: 20230424183026
Implantable Pulse Generator Implant Date: 20230322

## 2021-11-12 MED ORDER — TERBINAFINE HCL 250 MG PO TABS: ORAL_TABLET | ORAL | 0 refills | Status: DC

## 2021-11-12 NOTE — Progress Notes (Signed)
She presents today for follow-up of her Lamisil therapy.  She states that they have grown out by about half I really have not seen a whole lot of extra movement.  She denies fever chills nausea vomiting muscle aches pains calf pain back pain chest pain shortness of breath rashes or itching. ? ?Objective: Toenails have grown out hallux nails by about 50%. ? ?Assessment: Resolving onychomycosis. ? ?Plan: Provided her with a 30 more tablets she will take 1 tablet every other day of Lamisil 250 mg tablets.  I will follow-up with her in 3 months. ?

## 2021-11-18 NOTE — Progress Notes (Signed)
?[facility] ?[address] ?[place]. [place] [address] ?[phone] ? ?     HOSPITAL FOLLOW UP NOTE ? ?Ms. Ebony [name] ?Date of Birth:  Jul 30, 1940 ?Medical Record Number:  [id number]  ? ?Reason for Referral:  hospital stroke follow up ? ? ? ?SUBJECTIVE: ? ? ?CHIEF COMPLAINT:  ?Chief Complaint  ?Patient presents with  ? Follow-up  ?  Rm 1, alone. Here to f/u from recent hospital visit. Pt reports having vertigo for the last 2 days. Pt reports loss of feeling in L hand.   ? ? ?HPI:  ? ?Ebony [name] is a 81 y.o. who  has a past medical history of Arthritis, Cancer ([place]), Depression, GERD (gastroesophageal reflux disease), Heart murmur, Hypertension, Osteopenia, Shortness of breath dyspnea, Stroke ([place]), and Tendency toward bleeding easily ([place]).  Patient presented on 10/08/2021 with left sided arm weakness, numbness and tingling. She woke that morning and able to take a shower. While getting dressed she had difficulty using left arm. Symptoms seemed to improve until later in the day when she had trouble holding her drink and hold cards with left hand while at a friends home. 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 LDL is at goal on Repatha, HbA1c 5.9%. Aspirin '81mg'$  was added to patient's PTA plavix '75mg'$ . PT recommend outpatient therapy as patient's deficit is mild. OT no recommendations for outpatient. Due to concern for cardioembolic source not seen on transthoracic echocardiogram, a loop recorder was implanted prior to discharge.". Personally reviewed hospitalization pertinent progress notes, lab work and imaging.  Evaluated by Dr Erlinda Hong.  ? ?Since being home, she feels that she is doing fine. She continues to note numbness of left 4th and 5th digits. Numbness if not limiting. She did not work with PT/OT outpatient. She does not feel she needs to. She continues to do exercises at home from inpatient PT/OT.  She will occasionally drop something she is holding with the left hand. She has discontinued asa and now continued Plavix only. She has appt with Dr Percival Spanish on 5/22. She was seen by PCP the beginning of April. She has a long standing history of vertigo. She has more symptoms over the past two days. Symptoms usually resolve with rest. She is walking about a mile 1-3 times a week.  ? ? ?PERTINENT IMAGING/LABS ? ?CT no acute abnormality.   ?CT head and neck unremarkable.   ?MRI showed small linear right parietal infarct.   ?EF 60 to 65%  ?LE venous Doppler no DVT. ? ? ?A1C ?Lab Results  ?Component Value Date  ? HGBA1C 5.9 (H) 10/09/2021  ? ? ?Lipid Panel  ?   ?Component Value Date/Time  ? CHOL 122 10/09/2021 0325  ? TRIG 95 10/09/2021 0325  ? HDL 68 10/09/2021 0325  ? CHOLHDL 1.8 10/09/2021 0325  ? VLDL 19 10/09/2021 0325  ? [place] 35 10/09/2021 0325  ? ? ? ? ?ROS:   ?14 system review of systems performed and negative with exception of those listed in HPI ? ?PMH:  ?Past Medical History:  ?Diagnosis Date  ? Arthritis   ? Cancer [facility])   ? BASAL CELL CA  ? Depression   ? GERD (gastroesophageal reflux disease)   ? TAKES TUMS PRN  ? Heart murmur   ? "childhood" - no follow up as an adult  ? Hypertension   ? Osteopenia   ? Shortness of breath dyspnea   ?  WITH EXERTION  ? Stroke [facility])   ? 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 Tendency toward bleeding easily ([place])   ? ? ?PSH:  ?Past Surgical History:  ?Procedure Laterality Date  ? BUNIONECTOMY Right   ? CARDIAC CATHETERIZATION    ? CATARACT EXTRACTION Bilateral   ? KNEE ARTHROPLASTY Right 11/29/2015  ? Procedure: RIGHT TOTAL KNEE ARTHROPLASTY ;  Surgeon: Rod Can, MD;  Location: [facility];  Service: Orthopedics;  Laterality: Right;  ? KNEE ARTHROSCOPY Right 10/05/2013  ? Procedure: RIGHT KNEE ARTHROSCOPY WITH medial and lateral DEBRIDEMENT chrondroplasty;  Surgeon: [name], MD;  Location: [facility];  Service: Orthopedics;  Laterality: Right;  ? LOOP RECORDER INSERTION N/A  10/09/2021  ? Procedure: LOOP RECORDER INSERTION;  Surgeon: Evans Lance, MD;  Location: [place] [facility];  Service: Cardiovascular;  Laterality: N/A;  ? LUMBAR LAMINECTOMY/DECOMPRESSION MICRODISCECTOMY N/A 09/12/2021  ? Procedure: Lumbar decompression Lumbar three - Lumbar four and lumbar four-five;  Surgeon: Susa Day, MD;  Location: [place];  Service: Orthopedics;  Laterality: N/A;  ? MOHS SURGERY ON NOSE 01/2007, MOHS ON FACE 08/2012    ? TONSILLECTOMY    ? ? ?Social History:  ?Social History  ? ?Socioeconomic History  ? Marital status: Single  ?  Spouse name: Not on file  ? Number of children: Not on file  ? Years of education: Not on file  ? Highest education level: Not on file  ?Occupational History  ? Not on file  ?Tobacco Use  ? Smoking status: Former  ?  Packs/day: 1.00  ?  Years: 10.00  ?  Pack years: 10.00  ?  Types: Cigarettes  ?  Quit date: 10/04/1971  ?  Years since quitting: 50.1  ? Smokeless tobacco: Never  ?Vaping Use  ? Vaping Use: Never used  ?Substance and Sexual Activity  ? Alcohol use: Yes  ?  Comment: OCCAS ALCOHOL  ? Drug use: No  ? Sexual activity: Not on file  ?Other Topics Concern  ? Not on file  ?Social History Narrative  ? Not on file  ? ?Social Determinants of Health  ? ?Financial Resource Strain: Not on file  ?Food Insecurity: No Food Insecurity  ? Worried About Charity fundraiser in the Last Year: Never true  ? Ran Out of Food in the Last Year: Never true  ?Transportation Needs: No Transportation Needs  ? Lack of Transportation (Medical): No  ? Lack of Transportation (Non-Medical): No  ?Physical Activity: Not on file  ?Stress: Not on file  ?Social Connections: Not on file  ?Intimate Partner Violence: Not on file  ? ? ?Family History: History reviewed. No pertinent family history. ? ?Medications:   ?Current Outpatient Medications on File Prior to Visit  ?Medication Sig Dispense Refill  ? acetaminophen (TYLENOL) 500 MG tablet Take 1,000 mg by mouth every 6 (six) hours as needed for  headache (pain).    ? allopurinol (ZYLOPRIM) 100 MG tablet Take 100 mg by mouth every morning.    ? ALPRAZolam (XANAX) 0.5 MG tablet Take 0.5 mg by mouth at bedtime as needed for anxiety or sleep.   0  ? citalopram (CELEXA) 20 MG tablet Take 20 mg by mouth every morning.  1  ? clopidogrel (PLAVIX) 75 MG tablet Take 75 mg by mouth every morning.    ? Evolocumab (REPATHA) 140 MG/ML SOSY Inject 140 mg into the skin every 14 (fourteen) days.    ? terbinafine (LAMISIL) 250 MG tablet Take 1  tablet (250 mg total) by mouth daily. (Patient taking differently: Take 250 mg by mouth at bedtime.) 30 tablet 0  ? terbinafine (LAMISIL) 250 MG tablet 1 every other day 30 tablet 0  ? triamterene-hydrochlorothiazide (DYAZIDE) 37.5-25 MG capsule Take 1 capsule by mouth every morning.  6  ? ?No current facility-administered medications on file prior to visit.  ? ? ?Allergies:   ?Allergies  ?Allergen Reactions  ? Other Anaphylaxis  ?  Muscle Relaxer-Unsure which one  ? Codeine Nausea And Vomiting  ? Statins Other (See Comments)  ?   myalgias  ? Sulfa Antibiotics Other (See Comments)  ?  Red streaks on nose- ? cellulitis  ? ? ? ? ?OBJECTIVE: ? ?Physical Exam ? ?Vitals:  ? 11/19/21 1253  ?BP: (!) 160/93  ?Pulse: 67  ?Weight: 195 lb 8 oz (88.7 kg)  ?Height: '5\' 6"'$  (1.676 m)  ? ?Body mass index is 31.55 kg/m?[name] ?No results found. ? ?   ? View : No data to display.  ?  ?  ?  ?  ? ?General: well developed, well nourished, seated, in no evident distress ?Head: head normocephalic and atraumatic.   ?Neck: supple with no carotid or supraclavicular bruits ?Cardiovascular: regular rate and rhythm, no murmurs ?Musculoskeletal: no deformity ?Skin:  no rash/petichiae ?Vascular:  Normal pulses all extremities ?  ?Neurologic Exam ?Mental Status: Awake and fully alert.  Fluent speech and language.  Oriented to place and time. Recent and remote memory intact. Attention span, concentration and fund of knowledge appropriate. Mood and affect appropriate.   ?Cranial Nerves: Fundoscopic exam reveals sharp disc margins. Pupils equal, briskly reactive to light. Extraocular movements full without nystagmus. Visual fields full to confrontation. Hearing intact. Facial

## 2021-11-18 NOTE — Patient Instructions (Signed)
Below is our plan: ? ?Stroke: small linear right parietal infarct, consistent with embolic pattern : Residual deficit: none. Continue clopidogrel 75 mg daily  and Repatha  for secondary stroke prevention.  Discussed secondary stroke prevention measures and importance of close PCP follow up for aggressive stroke risk factor management. I have gone over the pathophysiology of stroke, warning signs and symptoms, risk factors and their management in some detail with instructions to go to the closest emergency room for symptoms of concern. ?HTN: BP goal <130/90. Elevated, today. 160/93. Patient reports normal at home. Continue monitoring at home. Continue Diazide per PCP.  ?HLD: LDL goal <70. Recent LDL 35. Continue Repatha.  ?DMII: A1c goal<7.0. Recent A1c 5.9. prediabetes, continue close follow up with PCP. Monitor intake of carbohydrates.  ?Cardiac monitoring: loop recorder interrogation to rule out afib. Follow up with cardiology 12/09/2021.  ?Obesity: continue healthy weight management habits with low carb diet and regular exercise.  ? ?Please make sure you are staying well hydrated. I recommend 50-60 ounces daily. Well balanced diet and regular exercise encouraged. Consistent sleep schedule with 6-8 hours recommended.  ? ?Please continue follow up with care team as directed.  ? ?Follow up with me in 6 months ? ?You may receive a survey regarding today's visit. I encourage you to leave honest feed back as I do use this information to improve patient care. Thank you for seeing me today!  ? ? ?

## 2021-11-19 ENCOUNTER — Encounter: Payer: Self-pay | Admitting: Family Medicine

## 2021-11-19 ENCOUNTER — Ambulatory Visit: Payer: Medicare PPO | Admitting: Family Medicine

## 2021-11-19 VITALS — BP 160/93 | HR 67 | Ht 66.0 in | Wt 195.5 lb

## 2021-11-19 DIAGNOSIS — I63411 Cerebral infarction due to embolism of right middle cerebral artery: Secondary | ICD-10-CM

## 2021-11-21 NOTE — Progress Notes (Signed)
I agree with the above plan 

## 2021-11-26 NOTE — Progress Notes (Signed)
Carelink Summary Report / Loop Recorder 

## 2021-12-08 ENCOUNTER — Encounter: Payer: Self-pay | Admitting: Cardiology

## 2021-12-08 NOTE — Progress Notes (Signed)
Cardiology Office Note   Date:  12/09/2021   ID:  ANUJA MANKA, DOB 07/09/1941, MRN [id number]  PCP:  Velna Hatchet, MD  Cardiologist:   None Referring:  Velna Hatchet, MD  No chief complaint on file.     History of Present Illness: Ebony [name] is a 81 y.o. female who presents for follow up of a CVA.  She was in the hospital in March for this.  I reviewed these records for this visit.   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  TTE did not discover an embolic source.  She had an implanted monitor.   She has had no evidence of atrial fib.    Of note looking at the echocardiogram results she had normal left ventricular function and no significant valvular abnormalities.  She has not had any prior cardiac work-up.  She gets around relatively well and does some walking a mile 3 times a week.  She goes to the gym for this.The patient denies any new symptoms such as chest discomfort, neck or arm discomfort. There has been no new shortness of breath, PND or orthopnea. There have been no reported palpitations, presyncope or syncope.     Past Medical History:  Diagnosis Date   Arthritis    Cancer ([place])    BASAL CELL CA   Depression    GERD (gastroesophageal reflux disease)    TAKES TUMS PRN   Hypertension    Osteopenia    Stroke ([place])    TIA 2004- PT IS ON PLAVIX    Past Surgical History:  Procedure Laterality Date   BUNIONECTOMY Right    CARDIAC CATHETERIZATION     CATARACT EXTRACTION Bilateral    KNEE ARTHROPLASTY Right 11/29/2015   Procedure: RIGHT TOTAL KNEE ARTHROPLASTY ;  Surgeon: Rod Can, MD;  Location: [facility];  Service: Orthopedics;  Laterality: Right;   KNEE ARTHROSCOPY Right 10/05/2013   Procedure: RIGHT KNEE ARTHROSCOPY WITH medial and lateral DEBRIDEMENT chrondroplasty;  Surgeon: [name], MD;  Location: [facility];  Service: Orthopedics;  Laterality: Right;   LOOP  RECORDER INSERTION N/A 10/09/2021   Procedure: LOOP RECORDER INSERTION;  Surgeon: Evans Lance, MD;  Location: [facility];  Service: Cardiovascular;  Laterality: N/A;   LUMBAR LAMINECTOMY/DECOMPRESSION MICRODISCECTOMY N/A 09/12/2021   Procedure: Lumbar decompression Lumbar three - Lumbar four and lumbar four-five;  Surgeon: Susa Day, MD;  Location: [facility];  Service: Orthopedics;  Laterality: N/A;   MOHS SURGERY ON NOSE 01/2007, MOHS ON FACE 08/2012     TONSILLECTOMY       Current Outpatient Medications  Medication Sig Dispense Refill   acetaminophen (TYLENOL) 500 MG tablet Take 1,000 mg by mouth every 6 (six) hours as needed for headache (pain).     allopurinol (ZYLOPRIM) 100 MG tablet Take 100 mg by mouth every morning.     ALPRAZolam (XANAX) 0.5 MG tablet Take 0.5 mg by mouth at bedtime as needed for anxiety or sleep.   0   citalopram (CELEXA) 20 MG tablet Take 20 mg by mouth every morning.  1   clopidogrel (PLAVIX) 75 MG tablet Take 75 mg by mouth every morning.     Evolocumab (REPATHA) 140 MG/ML SOSY Inject 140 mg into the skin every 14 (fourteen) days.     terbinafine (LAMISIL) 250 MG tablet Take 1 tablet (250 mg total) by mouth daily. (Patient taking differently: Take 250 mg  by mouth daily as needed.) 30 tablet 0   terbinafine (LAMISIL) 250 MG tablet 1 every other day 30 tablet 0   triamterene-hydrochlorothiazide (DYAZIDE) 37.5-25 MG capsule Take 1 capsule by mouth every morning.  6   meclizine (ANTIVERT) 25 MG tablet Take by mouth.     ondansetron (ZOFRAN) 4 MG tablet      No current facility-administered medications for this visit.    Allergies:   Other, Codeine, Statins, and Sulfa antibiotics    Social History:  The patient  reports that she quit smoking about 50 years ago. Her smoking use included cigarettes. She has a 10.00 pack-year smoking history. She has never used smokeless tobacco. She reports current alcohol use. She reports that she does not use drugs.    Family History:  The patient's family history is not on file.    ROS:  Please see the history of present illness.   Otherwise, review of systems are positive for none.   All other systems are reviewed and negative.    PHYSICAL EXAM: VS:  BP (!) 144/90   Pulse 87   Ht '5\' 6"'$  (1.676 m)   Wt 194 lb 12.8 oz (88.4 kg)   SpO2 94%   BMI 31.44 kg/m  , BMI Body mass index is 31.44 kg/m. GENERAL:  Well appearing HEENT:  Pupils equal round and reactive, fundi not visualized, oral mucosa unremarkable NECK:  No jugular venous distention, waveform within normal limits, carotid upstroke brisk and symmetric, no bruits, no thyromegaly LYMPHATICS:  No cervical, inguinal adenopathy LUNGS:  Clear to auscultation bilaterally BACK:  No CVA tenderness CHEST:  Unremarkable HEART:  PMI not displaced or sustained,S1 and S2 within normal limits, no S3, no S4, no clicks, no rubs, no murmurs ABD:  Flat, positive bowel sounds normal in frequency in pitch, no bruits, no rebound, no guarding, no midline pulsatile mass, no hepatomegaly, no splenomegaly EXT:  2 plus pulses throughout, no edema, no cyanosis no clubbing SKIN:  No rashes no nodules NEURO:  Cranial nerves II through XII grossly intact, motor grossly intact throughout PSYCH:  Cognitively intact, oriented to person place and time    EKG:  EKG is ordered today. The ekg ordered today demonstrates sinus rhythm, rate 87, left axis deviation, left anterior fascicular block, poor anterior R wave progression, no acute ST-T wave changes.   Recent Labs: 10/08/2021: Hemoglobin 13.5; Platelets 224 10/09/2021: ALT 16; BUN 26; Creatinine, Ser 1.25; Potassium 3.0; Sodium 137    Lipid Panel    Component Value Date/Time   CHOL 122 10/09/2021 0325   TRIG 95 10/09/2021 0325   HDL 68 10/09/2021 0325   CHOLHDL 1.8 10/09/2021 0325   VLDL 19 10/09/2021 0325   LDLCALC 35 10/09/2021 0325      Wt Readings from Last 3 Encounters:  12/09/21 194 lb 12.8 oz (88.4  kg)  11/19/21 195 lb 8 oz (88.7 kg)  10/09/21 191 lb 9.3 oz (86.9 kg)      Other studies Reviewed: Additional studies/ records that were reviewed today include: Hospital records, device interrogation. Review of the above records demonstrates:  Please see elsewhere in the note.     ASSESSMENT AND PLAN:  CVA:   She is taking Plavix.  There has been no evidence of atrial fibrillation.  She has minimal residual she said left hand weakness.  No change in therapy and we talked about the device and need for continued monitoring.  Dyslipdemia: LDL is 35 with an HDL of 68.  No change in therapy.  HTN: We gave her written instructions to get a monitor and to keep a blood pressure diary and she might need further adjustment to her meds.  CKD IIIa: She has a mildly increased creatinine and I reviewed this with her and emphasized the fact that this is controlled with with blood pressure control and lipid management.  Current medicines are reviewed at length with the patient today.  The patient does not have concerns regarding medicines.  The following changes have been made:  no change  Labs/ tests ordered today include: None  Orders Placed This Encounter  Procedures   EKG 12-Lead     Disposition:   FU with me in 12 month.      Signed, Minus Breeding, MD  12/09/2021 11:29 AM    [facility]

## 2021-12-09 ENCOUNTER — Ambulatory Visit: Payer: Medicare PPO | Admitting: Cardiology

## 2021-12-09 ENCOUNTER — Encounter: Payer: Self-pay | Admitting: Cardiology

## 2021-12-09 VITALS — BP 144/90 | HR 87 | Ht 66.0 in | Wt 194.8 lb

## 2021-12-09 DIAGNOSIS — I639 Cerebral infarction, unspecified: Secondary | ICD-10-CM | POA: Diagnosis not present

## 2021-12-09 NOTE — Patient Instructions (Signed)
Medication Instructions:  Your physician recommends that you continue on your current medications as directed. Please refer to the Current Medication list given to you today.  *If you need a refill on your cardiac medications before your next appointment, please call your pharmacy*  Follow-Up: At [facility], you and your health needs are our priority.  As part of our continuing mission to provide you with exceptional heart care, we have created designated Provider Care Teams.  These Care Teams include your primary Cardiologist (physician) and Advanced Practice Providers (APPs -  Physician Assistants and Nurse Practitioners) who all work together to provide you with the care you need, when you need it.  We recommend signing up for the patient portal called "MyChart".  Sign up information is provided on this After Visit Summary.  MyChart is used to connect with patients for Virtual Visits (Telemedicine).  Patients are able to view lab/test results, encounter notes, upcoming appointments, etc.  Non-urgent messages can be sent to your provider as well.   To learn more about what you can do with MyChart, go to [email or web address].    Your next appointment:   12 month(s)  The format for your next appointment:   In Person  Provider:   Dr. Percival Spanish  Important Information About Sugar

## 2021-12-12 ENCOUNTER — Other Ambulatory Visit: Payer: Self-pay

## 2021-12-12 NOTE — Patient Instructions (Signed)
Patient Goals/Self-Care Activities: Hypertension Take all medications as prescribed Attend all scheduled provider appointments check blood pressure 3 times per week write blood pressure results in a log or diary call doctor for signs and symptoms of high blood pressure take medications for blood pressure exactly as prescribed

## 2021-12-12 NOTE — Patient Outreach (Signed)
[facility]) Care Management  12/12/2021  TAMEYAH KOCH [birth year] [id number]   Telephone call to patient for disease management follow up. Patient reports doing well.  Hypertension management discussed.  No concerns.    Care Plan : RN CM Plan of Care  Updates made by Jon Billings, RN since 12/12/2021 12:00 AM     Problem: Chronic Disease Management and Care Coordination Needs Hypertension   Priority: High     Long-Range Goal: Development of Plan of Care for Management of Hypertension   Start Date: 11/07/2021  Expected End Date: 07/20/2022  This Visit's Progress: On track  Priority: High  Note:   Current Barriers:  Chronic Disease Management support and education needs related to HTN   RNCM Clinical Goal(s):  Patient will verbalize understanding of plan for management of HTN as evidenced by Blood pressure reports less than 140/80  through collaboration with RN Care manager, provider, and care team.   Interventions: Education and support related to HTN Inter-disciplinary care team collaboration (see longitudinal plan of care) Evaluation of current treatment plan related to  self management and patient's adherence to plan as established by provider   Hypertension Interventions:  (Status:  Goal on track:  Yes.) Long Term Goal Last practice recorded BP readings:  BP Readings from Last 3 Encounters:  10/09/21 126/61  09/12/21 137/75  09/03/21 114/87  Most recent eGFR/CrCl: No results found for: EGFR  No components found for: CRCL  Evaluation of current treatment plan related to hypertension self management and patient's adherence to plan as established by provider Provided education to patient re: stroke prevention, s/s of heart attack and stroke Reviewed medications with patient and discussed importance of compliance Discussed plans with patient for ongoing care management follow up and provided patient with direct contact information for care management  team  11/07/21 Initial assessment with patient.  No problems with increased blood pressure. Patient not checking presently.  Encouraged patient to check blood pressure at home or drug store and record.  Discussed hypertension management.   12/12/21 Patient reports doing well. Just got back from vacation.  Patient reports seeing physician on yesterday and he also advised her to purchase a blood pressure cuff to monitor her pressures three times a week. Patient reports she will be getting one. She reports that her blood pressure at the MD office was 144/90.  Encouraged activity and low salt diet and importance of monitoring her blood pressure.  No concerns.    Patient Goals/Self-Care Activities: Hypertension Take all medications as prescribed Attend all scheduled provider appointments check blood pressure 3 times per week write blood pressure results in a log or diary call doctor for signs and symptoms of high blood pressure take medications for blood pressure exactly as prescribed  Follow Up Plan:  Telephone follow up appointment with care management team member scheduled for:  July The patient has been provided with contact information for the care management team and has been advised to call with any health related questions or concerns.      Plan: Follow-up: Patient agrees to Care Plan and Follow-up. Follow-up in July.   Jone Baseman, RN, MSN [facility] Care Management Care Management Coordinator Direct Line [phone] Toll Free: [phone]  Fax: [phone]

## 2021-12-16 LAB — CUP PACEART REMOTE DEVICE CHECK
Date Time Interrogation Session: 20230527182810
Implantable Pulse Generator Implant Date: 20230322

## 2021-12-17 ENCOUNTER — Ambulatory Visit (INDEPENDENT_AMBULATORY_CARE_PROVIDER_SITE_OTHER): Payer: Medicare PPO

## 2021-12-17 ENCOUNTER — Other Ambulatory Visit: Payer: Self-pay | Admitting: Internal Medicine

## 2021-12-17 DIAGNOSIS — I639 Cerebral infarction, unspecified: Secondary | ICD-10-CM

## 2021-12-17 DIAGNOSIS — Z1231 Encounter for screening mammogram for malignant neoplasm of breast: Secondary | ICD-10-CM

## 2021-12-27 ENCOUNTER — Ambulatory Visit
Admission: RE | Admit: 2021-12-27 | Discharge: 2021-12-27 | Disposition: A | Discharge status: Home / Self Care | Payer: Medicare PPO | Source: Ambulatory Visit | Attending: Internal Medicine | Admitting: Internal Medicine

## 2021-12-27 DIAGNOSIS — Z1231 Encounter for screening mammogram for malignant neoplasm of breast: Secondary | ICD-10-CM

## 2021-12-30 NOTE — Progress Notes (Signed)
Carelink Summary Report / Loop Recorder 

## 2021-12-31 ENCOUNTER — Ambulatory Visit (INDEPENDENT_AMBULATORY_CARE_PROVIDER_SITE_OTHER): Payer: Self-pay | Admitting: Plastic Surgery

## 2021-12-31 DIAGNOSIS — Z719 Counseling, unspecified: Secondary | ICD-10-CM

## 2021-12-31 NOTE — Progress Notes (Signed)
Preoperative Dx: Hyperpigmentation of face  Postoperative Dx:  same  Procedure: laser to face  Anesthesia: none  Description of Procedure:  Risks and complications were explained to the patient. Consent was confirmed and signed. Eye protection was placed. Time out was called and all information was confirmed to be correct. The area  area was prepped with alcohol and wiped dry. The BBL laser was set at 590 nm and 515 nm at 6 J/cm2. The face was lasered. The patient tolerated the procedure well and there were no complications. The patient is to follow up in 4 weeks.

## 2022-01-20 ENCOUNTER — Ambulatory Visit (INDEPENDENT_AMBULATORY_CARE_PROVIDER_SITE_OTHER): Payer: Medicare PPO

## 2022-01-20 DIAGNOSIS — I639 Cerebral infarction, unspecified: Secondary | ICD-10-CM

## 2022-01-21 LAB — CUP PACEART REMOTE DEVICE CHECK
Date Time Interrogation Session: 20230702231146
Implantable Pulse Generator Implant Date: 20230322

## 2022-02-04 ENCOUNTER — Encounter: Payer: Self-pay | Admitting: Plastic Surgery

## 2022-02-04 ENCOUNTER — Ambulatory Visit (INDEPENDENT_AMBULATORY_CARE_PROVIDER_SITE_OTHER): Payer: Self-pay | Admitting: Plastic Surgery

## 2022-02-04 DIAGNOSIS — Z719 Counseling, unspecified: Secondary | ICD-10-CM

## 2022-02-04 NOTE — Progress Notes (Signed)
Preoperative Dx: hyperpigmentation of face  Postoperative Dx:  same  Procedure: laser to face   Anesthesia: none  Description of Procedure:  Risks and complications were explained to the patient. Consent was confirmed and signed. Eye protection was placed. Time out was called and all information was confirmed to be correct. The area  area was prepped with alcohol and wiped dry. The BBL laser was set at 560 nm and 515 nm at 6 J/cm2. The face was lasered. The patient tolerated the procedure well and there were no complications. The patient is to follow up in 4 weeks.

## 2022-02-05 DIAGNOSIS — F419 Anxiety disorder, unspecified: Secondary | ICD-10-CM | POA: Diagnosis not present

## 2022-02-05 DIAGNOSIS — B351 Tinea unguium: Secondary | ICD-10-CM | POA: Diagnosis not present

## 2022-02-05 DIAGNOSIS — D693 Immune thrombocytopenic purpura: Secondary | ICD-10-CM | POA: Diagnosis not present

## 2022-02-05 DIAGNOSIS — E669 Obesity, unspecified: Secondary | ICD-10-CM | POA: Diagnosis not present

## 2022-02-05 DIAGNOSIS — E785 Hyperlipidemia, unspecified: Secondary | ICD-10-CM | POA: Diagnosis not present

## 2022-02-05 DIAGNOSIS — I10 Essential (primary) hypertension: Secondary | ICD-10-CM | POA: Diagnosis not present

## 2022-02-05 DIAGNOSIS — F330 Major depressive disorder, recurrent, mild: Secondary | ICD-10-CM | POA: Diagnosis not present

## 2022-02-05 DIAGNOSIS — G319 Degenerative disease of nervous system, unspecified: Secondary | ICD-10-CM | POA: Diagnosis not present

## 2022-02-05 DIAGNOSIS — G4700 Insomnia, unspecified: Secondary | ICD-10-CM | POA: Diagnosis not present

## 2022-02-06 ENCOUNTER — Other Ambulatory Visit: Payer: Self-pay

## 2022-02-06 NOTE — Patient Outreach (Signed)
[facility]) Care Management  02/06/2022  Ebony [name] May 04, 1941 [id number]   Telephone call to patient for disease management follow up.   No answer.  HIPAA compliant voice message left.    Plan: If no return call, RN CM will attempt patient again August.    Altha Sweitzer J Orvill Coulthard, RN, MSN [place] Management Care Management Coordinator Direct Line [phone] Toll Free: [phone]  Fax: [phone]

## 2022-02-10 DIAGNOSIS — Z1211 Encounter for screening for malignant neoplasm of colon: Secondary | ICD-10-CM | POA: Diagnosis not present

## 2022-02-10 NOTE — Progress Notes (Signed)
Carelink Summary Report / Loop Recorder 

## 2022-02-11 ENCOUNTER — Encounter: Payer: Self-pay | Admitting: Podiatry

## 2022-02-11 ENCOUNTER — Ambulatory Visit: Payer: Medicare PPO | Admitting: Podiatry

## 2022-02-11 DIAGNOSIS — Z79899 Other long term (current) drug therapy: Secondary | ICD-10-CM

## 2022-02-11 DIAGNOSIS — L603 Nail dystrophy: Secondary | ICD-10-CM

## 2022-02-11 MED ORDER — TERBINAFINE HCL 250 MG PO TABS: 250.0000 mg | ORAL_TABLET | Freq: Every day | ORAL | 0 refills | Status: DC

## 2022-02-11 NOTE — Progress Notes (Signed)
She presents today for follow-up of her nail fungus.  She is completed her first every other day dose of Lamisil.  States she has been taking it for 60 days and she has been off of it for 2 to 4 weeks at this point.  She states that they seem to be getting better and she is concerned that we have not done a test on the liver or the kidneys recently so she would like to have 1 of those done.  Objective: Vital signs stable she alert and orient x3 pulse strong palpable her toenails have grown out by about 85 almost 90% at this point.  They look very good.  Assessment: Well-healing onychomycosis long-term therapy with Lamisil.  Plan: I would like to go ahead and follow-up with her again in about 3 months I am going to send her for complete metabolic panel I will follow-up with her should that come back abnormal.  The requisition was given her today with the address.

## 2022-02-12 LAB — COMPREHENSIVE METABOLIC PANEL
AG Ratio: 1.6 (calc) (ref 1.0–2.5)
ALT: 12 U/L (ref 6–29)
AST: 16 U/L (ref 10–35)
Albumin: 4 g/dL (ref 3.6–5.1)
Alkaline phosphatase (APISO): 57 U/L (ref 37–153)
BUN/Creatinine Ratio: 23 (calc) — ABNORMAL HIGH (ref 6–22)
BUN: 24 mg/dL (ref 7–25)
CO2: 24 mmol/L (ref 20–32)
Calcium: 9.3 mg/dL (ref 8.6–10.4)
Chloride: 102 mmol/L (ref 98–110)
Creat: 1.03 mg/dL — ABNORMAL HIGH (ref 0.60–0.95)
Globulin: 2.5 g/dL (calc) (ref 1.9–3.7)
Glucose, Bld: 184 mg/dL — ABNORMAL HIGH (ref 65–139)
Potassium: 3.5 mmol/L (ref 3.5–5.3)
Sodium: 141 mmol/L (ref 135–146)
Total Bilirubin: 0.3 mg/dL (ref 0.2–1.2)
Total Protein: 6.5 g/dL (ref 6.1–8.1)

## 2022-02-13 ENCOUNTER — Telehealth: Payer: Self-pay | Admitting: *Deleted

## 2022-02-13 NOTE — Telephone Encounter (Signed)
-----   Message from Garrel Ridgel, [place] sent at 02/13/2022  6:47 AM EDT ----- Blood work looks good.

## 2022-02-24 ENCOUNTER — Ambulatory Visit (INDEPENDENT_AMBULATORY_CARE_PROVIDER_SITE_OTHER): Payer: Medicare PPO

## 2022-02-24 DIAGNOSIS — I639 Cerebral infarction, unspecified: Secondary | ICD-10-CM

## 2022-02-24 LAB — CUP PACEART REMOTE DEVICE CHECK
Date Time Interrogation Session: 20230804231252
Implantable Pulse Generator Implant Date: 20230322

## 2022-03-05 ENCOUNTER — Other Ambulatory Visit: Payer: Self-pay

## 2022-03-05 NOTE — Patient Outreach (Signed)
[facility]) Care Management  03/05/2022  Ebony [name] [birth year] [id number]   Telephone call to patient for disease management follow up.   No answer.  HIPAA compliant voice message left.    Plan: If no return call, RN CM will attempt patient again in August.  Rion Catala J Ollin Hochmuth, RN, MSN [place] Management Care Management Coordinator Direct Line [phone] Toll Free: [phone]  Fax: [phone]

## 2022-03-10 DIAGNOSIS — M545 Low back pain, unspecified: Secondary | ICD-10-CM | POA: Diagnosis not present

## 2022-03-10 DIAGNOSIS — M25511 Pain in right shoulder: Secondary | ICD-10-CM | POA: Diagnosis not present

## 2022-03-10 DIAGNOSIS — M5459 Other low back pain: Secondary | ICD-10-CM | POA: Insufficient documentation

## 2022-03-11 ENCOUNTER — Other Ambulatory Visit: Payer: Self-pay

## 2022-03-11 NOTE — Patient Outreach (Signed)
[facility]) Care Management  03/11/2022  Ebony [name] [birth year] [id number]   Multiple attempts to maintain contact with patient without success. No response from letter mailed to patient.    Plan: RN CM will close case.   Jone Baseman, RN, MSN [facility] Care Management Care Management Coordinator Direct Line [phone] Toll Free: [phone]  Fax: [phone]

## 2022-03-14 DIAGNOSIS — M545 Low back pain, unspecified: Secondary | ICD-10-CM | POA: Diagnosis not present

## 2022-03-28 DIAGNOSIS — M545 Low back pain, unspecified: Secondary | ICD-10-CM | POA: Diagnosis not present

## 2022-03-31 ENCOUNTER — Ambulatory Visit (INDEPENDENT_AMBULATORY_CARE_PROVIDER_SITE_OTHER): Payer: Medicare PPO

## 2022-03-31 DIAGNOSIS — I639 Cerebral infarction, unspecified: Secondary | ICD-10-CM

## 2022-04-01 LAB — CUP PACEART REMOTE DEVICE CHECK
Date Time Interrogation Session: 20230906232030
Implantable Pulse Generator Implant Date: 20230322

## 2022-04-01 NOTE — Progress Notes (Signed)
Carelink Summary Report / Loop Recorder 

## 2022-04-04 DIAGNOSIS — M545 Low back pain, unspecified: Secondary | ICD-10-CM | POA: Diagnosis not present

## 2022-04-11 DIAGNOSIS — M545 Low back pain, unspecified: Secondary | ICD-10-CM | POA: Diagnosis not present

## 2022-04-17 NOTE — Progress Notes (Signed)
Carelink Summary Report / Loop Recorder 

## 2022-04-23 DIAGNOSIS — N9089 Other specified noninflammatory disorders of vulva and perineum: Secondary | ICD-10-CM | POA: Diagnosis not present

## 2022-04-23 DIAGNOSIS — Z01419 Encounter for gynecological examination (general) (routine) without abnormal findings: Secondary | ICD-10-CM | POA: Diagnosis not present

## 2022-04-23 DIAGNOSIS — Z6830 Body mass index (BMI) 30.0-30.9, adult: Secondary | ICD-10-CM | POA: Diagnosis not present

## 2022-04-28 DIAGNOSIS — E785 Hyperlipidemia, unspecified: Secondary | ICD-10-CM | POA: Diagnosis not present

## 2022-04-28 DIAGNOSIS — M109 Gout, unspecified: Secondary | ICD-10-CM | POA: Diagnosis not present

## 2022-04-28 DIAGNOSIS — I10 Essential (primary) hypertension: Secondary | ICD-10-CM | POA: Diagnosis not present

## 2022-04-28 DIAGNOSIS — R7989 Other specified abnormal findings of blood chemistry: Secondary | ICD-10-CM | POA: Diagnosis not present

## 2022-04-28 DIAGNOSIS — F419 Anxiety disorder, unspecified: Secondary | ICD-10-CM | POA: Diagnosis not present

## 2022-04-28 DIAGNOSIS — E559 Vitamin D deficiency, unspecified: Secondary | ICD-10-CM | POA: Diagnosis not present

## 2022-05-01 LAB — CUP PACEART REMOTE DEVICE CHECK
Date Time Interrogation Session: 20231009231630
Implantable Pulse Generator Implant Date: 20230322

## 2022-05-05 ENCOUNTER — Ambulatory Visit (INDEPENDENT_AMBULATORY_CARE_PROVIDER_SITE_OTHER): Payer: Medicare PPO

## 2022-05-05 DIAGNOSIS — I639 Cerebral infarction, unspecified: Secondary | ICD-10-CM | POA: Diagnosis not present

## 2022-05-05 DIAGNOSIS — R0600 Dyspnea, unspecified: Secondary | ICD-10-CM | POA: Diagnosis not present

## 2022-05-05 DIAGNOSIS — F419 Anxiety disorder, unspecified: Secondary | ICD-10-CM | POA: Diagnosis not present

## 2022-05-05 DIAGNOSIS — F330 Major depressive disorder, recurrent, mild: Secondary | ICD-10-CM | POA: Diagnosis not present

## 2022-05-05 DIAGNOSIS — E785 Hyperlipidemia, unspecified: Secondary | ICD-10-CM | POA: Diagnosis not present

## 2022-05-05 DIAGNOSIS — I129 Hypertensive chronic kidney disease with stage 1 through stage 4 chronic kidney disease, or unspecified chronic kidney disease: Secondary | ICD-10-CM | POA: Diagnosis not present

## 2022-05-05 DIAGNOSIS — I10 Essential (primary) hypertension: Secondary | ICD-10-CM | POA: Diagnosis not present

## 2022-05-05 DIAGNOSIS — Z23 Encounter for immunization: Secondary | ICD-10-CM | POA: Diagnosis not present

## 2022-05-05 DIAGNOSIS — N1831 Chronic kidney disease, stage 3a: Secondary | ICD-10-CM | POA: Diagnosis not present

## 2022-05-05 DIAGNOSIS — Z8673 Personal history of transient ischemic attack (TIA), and cerebral infarction without residual deficits: Secondary | ICD-10-CM | POA: Diagnosis not present

## 2022-05-05 DIAGNOSIS — I6523 Occlusion and stenosis of bilateral carotid arteries: Secondary | ICD-10-CM | POA: Diagnosis not present

## 2022-05-05 DIAGNOSIS — Z0000 Encounter for general adult medical examination without abnormal findings: Secondary | ICD-10-CM | POA: Diagnosis not present

## 2022-05-12 DIAGNOSIS — L814 Other melanin hyperpigmentation: Secondary | ICD-10-CM | POA: Diagnosis not present

## 2022-05-12 DIAGNOSIS — D485 Neoplasm of uncertain behavior of skin: Secondary | ICD-10-CM | POA: Diagnosis not present

## 2022-05-12 DIAGNOSIS — Z85828 Personal history of other malignant neoplasm of skin: Secondary | ICD-10-CM | POA: Diagnosis not present

## 2022-05-12 DIAGNOSIS — L570 Actinic keratosis: Secondary | ICD-10-CM | POA: Diagnosis not present

## 2022-05-12 DIAGNOSIS — C44612 Basal cell carcinoma of skin of right upper limb, including shoulder: Secondary | ICD-10-CM | POA: Diagnosis not present

## 2022-05-12 DIAGNOSIS — L821 Other seborrheic keratosis: Secondary | ICD-10-CM | POA: Diagnosis not present

## 2022-05-12 DIAGNOSIS — C44519 Basal cell carcinoma of skin of other part of trunk: Secondary | ICD-10-CM | POA: Diagnosis not present

## 2022-05-12 DIAGNOSIS — L298 Other pruritus: Secondary | ICD-10-CM | POA: Diagnosis not present

## 2022-05-19 DIAGNOSIS — M25562 Pain in left knee: Secondary | ICD-10-CM | POA: Diagnosis not present

## 2022-05-28 ENCOUNTER — Ambulatory Visit: Payer: Medicare PPO | Admitting: Family Medicine

## 2022-05-29 NOTE — Progress Notes (Signed)
Carelink Summary Report / Loop Recorder 

## 2022-06-04 DIAGNOSIS — Z1152 Encounter for screening for COVID-19: Secondary | ICD-10-CM | POA: Diagnosis not present

## 2022-06-04 DIAGNOSIS — R0981 Nasal congestion: Secondary | ICD-10-CM | POA: Diagnosis not present

## 2022-06-04 DIAGNOSIS — J209 Acute bronchitis, unspecified: Secondary | ICD-10-CM | POA: Diagnosis not present

## 2022-06-04 DIAGNOSIS — J302 Other seasonal allergic rhinitis: Secondary | ICD-10-CM | POA: Diagnosis not present

## 2022-06-04 DIAGNOSIS — Z87891 Personal history of nicotine dependence: Secondary | ICD-10-CM | POA: Diagnosis not present

## 2022-06-04 DIAGNOSIS — R5383 Other fatigue: Secondary | ICD-10-CM | POA: Diagnosis not present

## 2022-06-05 NOTE — Patient Instructions (Incomplete)

## 2022-06-05 NOTE — Progress Notes (Deleted)
[facility] [address] [place]. [place] [address] [phone]       [place] TYRONZA HAPPE Date of Birth:  1941/04/07 Medical Record Number:  [id number]   Reason for Referral:  hospital stroke follow up    SUBJECTIVE:   CHIEF COMPLAINT:  No chief complaint on file.   HPI:   Ebony [name] is a 81 y.o. who  has a past medical history of Arthritis, Cancer ([place]), Depression, GERD (gastroesophageal reflux disease), Hypertension, Osteopenia, and Stroke ([place]).  Patient presented on 10/08/2021 with left sided arm weakness, numbness and tingling. She woke that morning and able to take a shower. While getting dressed she had difficulty using left arm. Symptoms seemed to improve until later in the day when she had trouble holding her drink and hold cards with left hand while at a friends home. 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 LDL is at goal on Repatha, HbA1c 5.9%. Aspirin '81mg'$  was added to patient's PTA plavix '75mg'$ . PT recommend outpatient therapy as patient's deficit is mild. OT no recommendations for outpatient. Due to concern for cardioembolic source not seen on transthoracic echocardiogram, a loop recorder was implanted prior to discharge.". Personally reviewed hospitalization pertinent progress notes, lab work and imaging.  Evaluated by Dr Erlinda Hong.   Since being home, she feels that she is doing fine. She continues to note numbness of left 4th and 5th digits. Numbness if not limiting. She did not work with PT/OT outpatient. She does not feel she needs to. She continues to do exercises at home from inpatient PT/OT. She will occasionally drop something she is holding with the left hand. She has discontinued asa and now continued Plavix only. She has appt with Dr Percival Spanish on 5/22. She was seen by PCP the beginning of April. She has a long standing history of  vertigo. She has more symptoms over the past two days. Symptoms usually resolve with rest. She is walking about a mile 1-3 times a week.   UPDATE 06/09/2022 ALL: Ebony [name] returns for follow up for CVA.    PERTINENT IMAGING/LABS  CT no acute abnormality.   CT head and neck unremarkable.   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   EF 60 to 65%  LE venous Doppler no DVT.   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)    TIA 2004- PT IS ON PLAVIX    PSH:  Past Surgical History:  Procedure Laterality Date   BUNIONECTOMY Right    CARDIAC CATHETERIZATION     CATARACT EXTRACTION Bilateral    KNEE ARTHROPLASTY Right 11/29/2015   Procedure: RIGHT TOTAL KNEE ARTHROPLASTY ;  Surgeon: Rod Can, MD;  Location: [facility];  Service: Orthopedics;  Laterality: Right;   KNEE ARTHROSCOPY Right 10/05/2013   Procedure: RIGHT KNEE ARTHROSCOPY WITH medial and lateral DEBRIDEMENT chrondroplasty;  Surgeon: [name], MD;  Location: [facility];  Service: Orthopedics;  Laterality: Right;   LOOP RECORDER INSERTION N/A 10/09/2021   Procedure: LOOP RECORDER INSERTION;  Surgeon: Evans Lance, MD;  Location: [facility];  Service: Cardiovascular;  Laterality: N/A;   LUMBAR LAMINECTOMY/DECOMPRESSION MICRODISCECTOMY N/A 09/12/2021   Procedure: Lumbar decompression Lumbar three - Lumbar four and lumbar four-five;  Surgeon: Susa Day, MD;   Location: [facility];  Service: Orthopedics;  Laterality: N/A;   MOHS SURGERY ON NOSE 01/2007, MOHS ON FACE 08/2012     TONSILLECTOMY      Social History:  Social History   Socioeconomic History   Marital status: Single    Spouse name: Not on file   Number of children: Not on file   Years of education: Not on file   Highest education level: Not on file  Occupational History   Not on file  Tobacco Use   Smoking status: Former    Packs/day: 1.00    Years: 10.00    Total pack years: 10.00    Types: Cigarettes    Quit date: 10/04/1971    Years since quitting: 50.7   Smokeless tobacco: Never  Vaping Use   Vaping Use: Never used  Substance and Sexual Activity   Alcohol use: Yes    Comment: OCCAS ALCOHOL   Drug use: No   Sexual activity: Not on file  Other Topics Concern   Not on file  Social History Narrative   Not on file   Social Determinants of Health   Financial Resource Strain: Not on file  Food Insecurity: No Food Insecurity (11/07/2021)   Hunger Vital Sign    Worried About Running Out of Food in the Last Year: Never true    Ran Out of Food in the Last Year: Never true  Transportation Needs: No Transportation Needs (11/07/2021)   PRAPARE - Hydrologist (Medical): No    Lack of Transportation (Non-Medical): No  Physical Activity: Not on file  Stress: Not on file  Social Connections: Not on file  Intimate Partner Violence: Not on file    Family History:  Family History  Problem Relation Age of Onset   Breast cancer Sister     Medications:   Current Outpatient Medications on File Prior to Visit  Medication Sig Dispense Refill   acetaminophen (TYLENOL) 500 MG tablet Take 1,000 mg by mouth every 6 (six) hours as needed for headache (pain).     allopurinol (ZYLOPRIM) 100 MG tablet Take 100 mg by mouth every morning.     ALPRAZolam (XANAX) 0.5 MG tablet Take 0.5 mg by mouth at bedtime as needed for anxiety or sleep.   0   citalopram  (CELEXA) 20 MG tablet Take 20 mg by mouth every morning.  1   clopidogrel (PLAVIX) 75 MG tablet Take 75 mg by mouth every morning.     Evolocumab (REPATHA) 140 MG/ML SOSY Inject 140 mg into the skin every 14 (fourteen) days.     meclizine (ANTIVERT) 25 MG tablet Take by mouth.     ondansetron (ZOFRAN) 4 MG tablet      terbinafine (LAMISIL) 250 MG tablet Take 1 tablet (250 mg total) by mouth daily. 30 tablet 0   triamterene-hydrochlorothiazide (DYAZIDE) 37.5-25 MG capsule  Take 1 capsule by mouth every morning.  6   No current facility-administered medications on file prior to visit.    Allergies:   Allergies  Allergen Reactions   Other Anaphylaxis    Muscle Relaxer-Unsure which one   Codeine Nausea And Vomiting   Statins Other (See Comments)     myalgias   Sulfa Antibiotics Other (See Comments)    Red streaks on nose- ? cellulitis      OBJECTIVE:  Physical Exam  There were no vitals filed for this visit.  There is no height or weight on file to calculate BMI. No results found.      No data to display           General: well developed, well nourished, seated, in no evident distress Head: head normocephalic and atraumatic.   Neck: supple with no carotid or supraclavicular bruits Cardiovascular: regular rate and rhythm, no murmurs Musculoskeletal: no deformity Skin:  no rash/petichiae Vascular:  Normal pulses all extremities   Neurologic Exam Mental Status: Awake and fully alert.  Fluent speech and language.  Oriented to place and time. Recent and remote memory intact. Attention span, concentration and fund of knowledge appropriate. Mood and affect appropriate.  Cranial Nerves: Fundoscopic exam reveals sharp disc margins. Pupils equal, briskly reactive to light. Extraocular movements full without nystagmus. Visual fields full to confrontation. Hearing intact. Facial sensation intact. Face, tongue, palate moves normally and symmetrically.  Motor: Normal bulk and tone.  Normal strength in all tested extremity muscles with exception of left hip flexion 4+/5 (post back surgery)  Sensory.: intact to touch , pinprick , position and vibratory sensation. No abnormal sensory changes reported with exam  Coordination: Rapid alternating movements normal in all extremities. Finger-to-nose and heel-to-shin performed accurately bilaterally. Gait and Station: Arises from chair without difficulty. Stance is normal. Gait demonstrates normal stride length and balance with no assistive device. Tandem walk and heel toe unsteady.  Reflexes: 1+ and symmetric with exception of left patellar which was absent (post back surgery)    NIHSS  0 Modified Rankin  0    ASSESSMENT: Ebony [name] is a 81 y.o. year old female presented to the ER 10/08/2021 with left arm weakness . Vascular risk factors include HTN, HLD, CHD 3A.    PLAN:  Stroke: small linear right parietal infarct, consistent with embolic pattern : Residual deficit: numbness of left 4th and 5th digit, neuro exam intact. Continue clopidogrel 75 mg daily  and Repatha  for secondary stroke prevention.  Discussed secondary stroke prevention measures and importance of close PCP follow up for aggressive stroke risk factor management. I have gone over the pathophysiology of stroke, warning signs and symptoms, risk factors and their management in some detail with instructions to go to the closest emergency room for symptoms of concern. HTN: BP goal <130/90. Elevated, today. 160/93. Patient reports normal at home. Continue monitoring at home. Continue Diazide per PCP.  HLD: LDL goal <70. Recent LDL 35. Continue Repatha.  DMII: A1c goal<7.0. Recent A1c 5.9. prediabetes, continue close follow up with PCP. Monitor intake of carbohydrates.  Cardiac monitoring: loop recorder interrogation to rule out afib. Follow up with cardiology 12/09/2021.  Obesity: continue healthy weight management habits with low carb diet and regular exercise.     Follow up in 6 months or call earlier if needed   CC:  [facility] provider: Dr. Leonie Man PCP: Velna Hatchet, MD    I spent 45 minutes of face-to-face and non-face-to-face time with patient.  patient.  This included previsit chart review including review of recent hospitalization, lab review, study review, order entry, electronic health record documentation, patient education regarding recent stroke including etiology, secondary stroke prevention measures and importance of managing stroke risk factors, residual deficits and typical recovery time and answered all other questions to patient satisfaction   Kearia Yin, FNP-C  [facility] [address] [place], [place] [address]  Phone [phone] Fax [phone] Note: This document was prepared with digital dictation and possible smart phrase technology. Any transcriptional errors that result from this process are unintentional.       

## 2022-06-09 ENCOUNTER — Telehealth: Payer: Self-pay | Admitting: Family Medicine

## 2022-06-09 ENCOUNTER — Ambulatory Visit: Payer: Medicare PPO | Admitting: Family Medicine

## 2022-06-09 ENCOUNTER — Ambulatory Visit (INDEPENDENT_AMBULATORY_CARE_PROVIDER_SITE_OTHER): Payer: Medicare PPO

## 2022-06-09 DIAGNOSIS — I639 Cerebral infarction, unspecified: Secondary | ICD-10-CM | POA: Diagnosis not present

## 2022-06-09 DIAGNOSIS — I63411 Cerebral infarction due to embolism of right middle cerebral artery: Secondary | ICD-10-CM

## 2022-06-09 NOTE — Telephone Encounter (Signed)
Pt cancelled appt due to having bronchitis.

## 2022-06-09 NOTE — Telephone Encounter (Signed)
Noted  

## 2022-06-10 LAB — CUP PACEART REMOTE DEVICE CHECK
Date Time Interrogation Session: 20231119231728
Implantable Pulse Generator Implant Date: 20230322

## 2022-07-08 IMAGING — CR DG LUMBAR SPINE 2-3V
3 series · 3 of 3 positions shown · non-contrast
Comparison: None.

CLINICAL DATA: Preop lateral views for localization

EXAM:
LUMBAR SPINE - 2-3 VIEW

[lateral (1 of 3)]
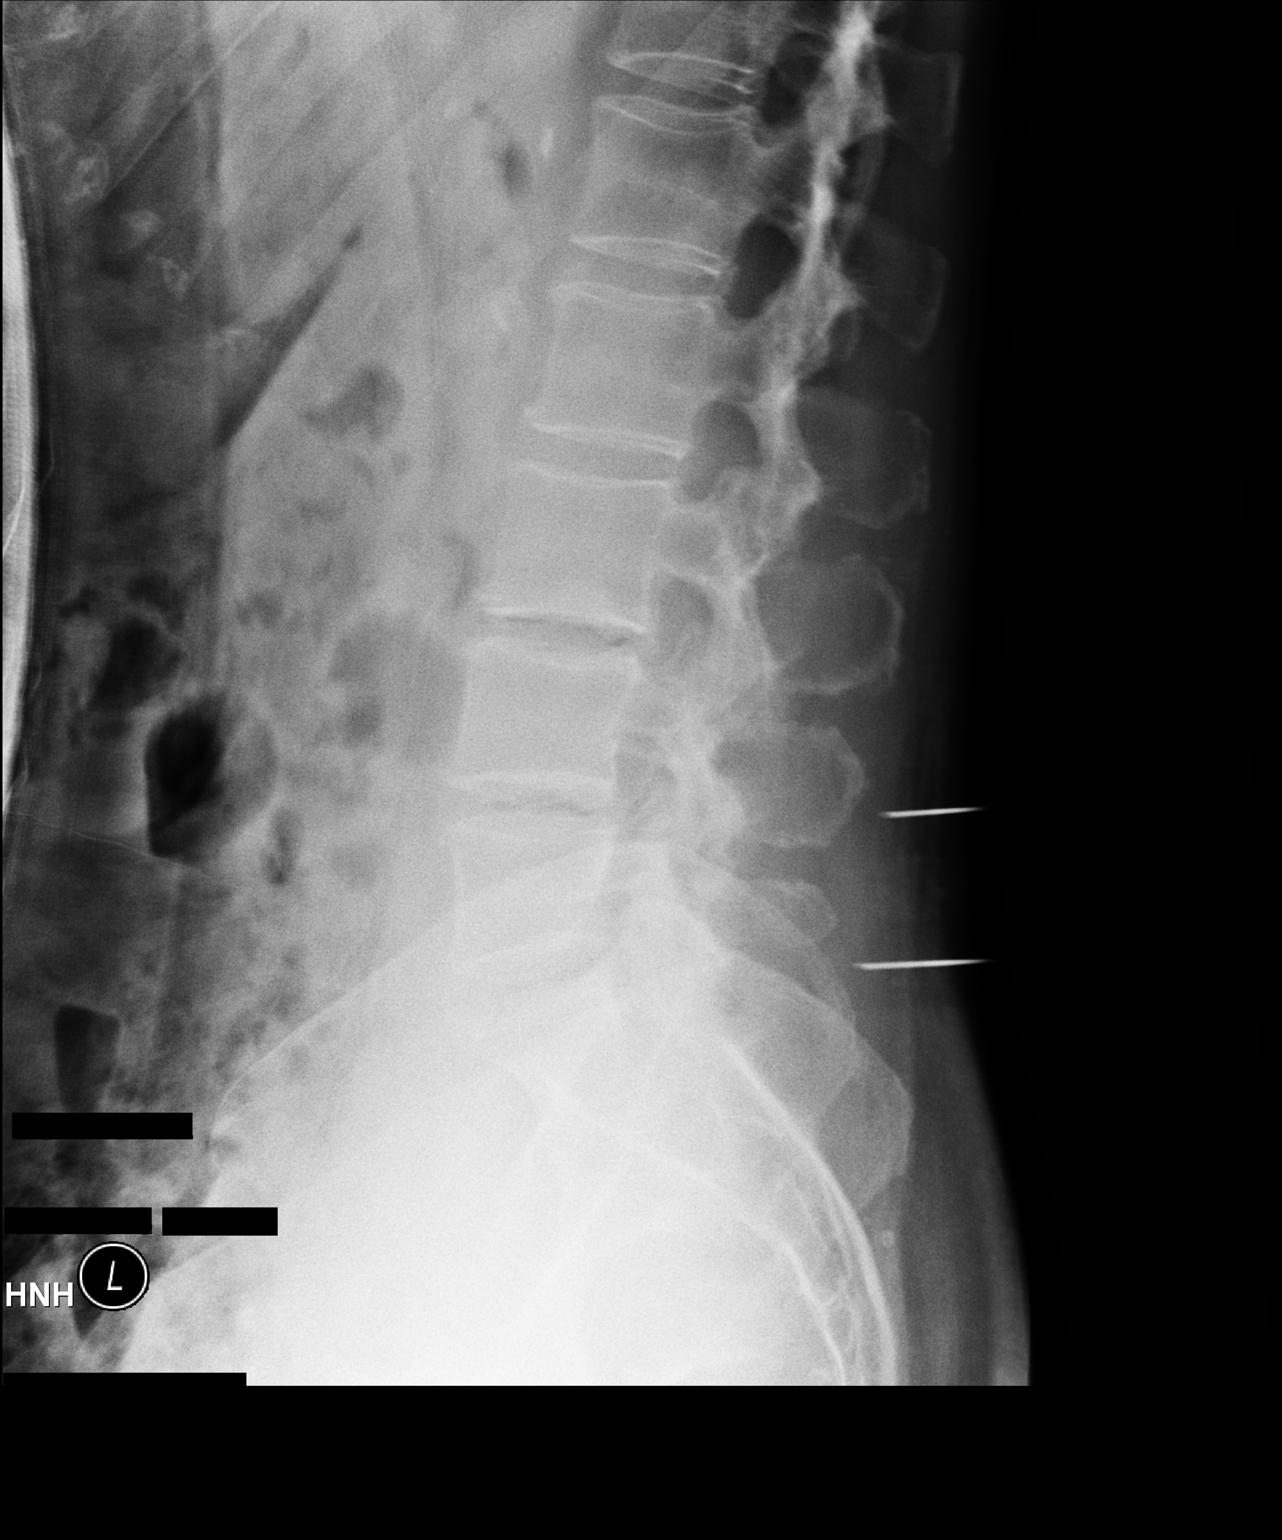

[lateral (2 of 3)]
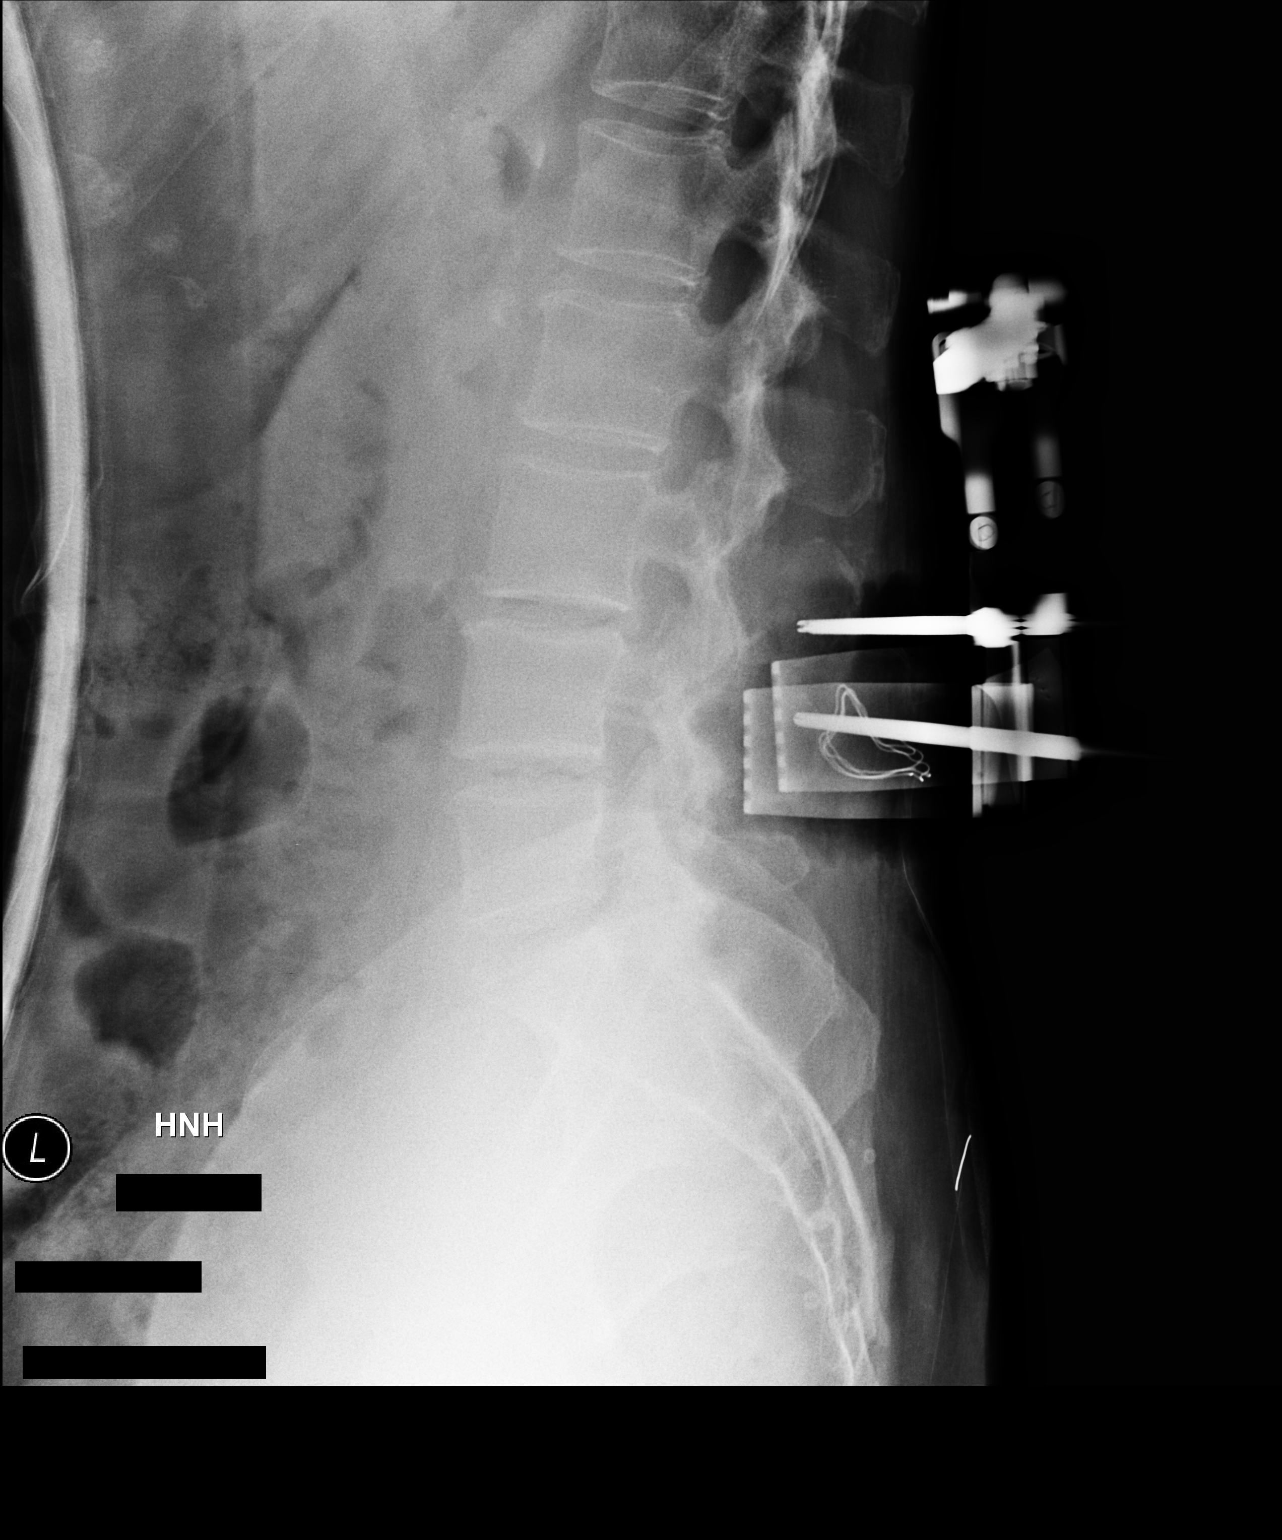

[lateral (3 of 3)]
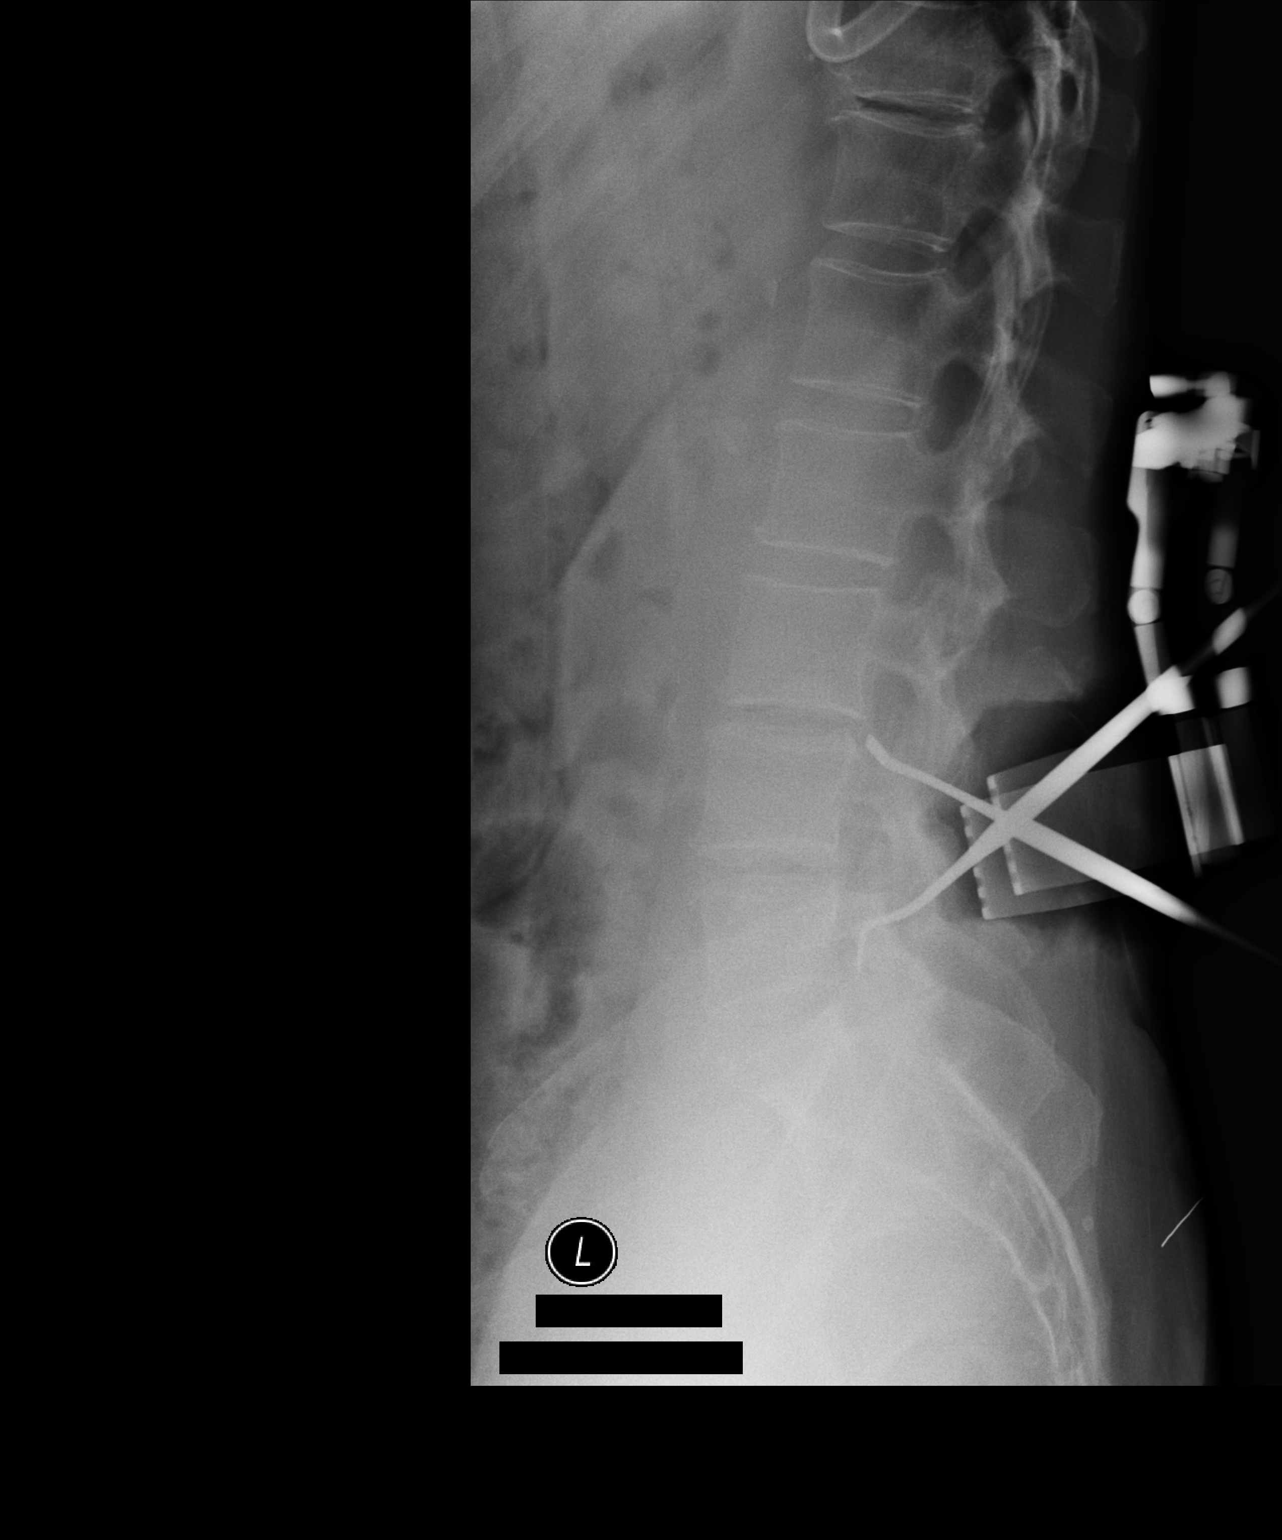

[3 of 3 positions shown; findings below may reference images not displayed]

FINDINGS: Surgical markers at L4 and L5 spinous processes. Moderate multilevel
degenerative disc disease of the lumbar spine.
IMPRESSION: Preop lateral views for localization.

## 2022-07-10 DIAGNOSIS — M25552 Pain in left hip: Secondary | ICD-10-CM | POA: Insufficient documentation

## 2022-07-15 ENCOUNTER — Ambulatory Visit (INDEPENDENT_AMBULATORY_CARE_PROVIDER_SITE_OTHER): Payer: Medicare PPO

## 2022-07-15 DIAGNOSIS — I639 Cerebral infarction, unspecified: Secondary | ICD-10-CM

## 2022-07-16 LAB — CUP PACEART REMOTE DEVICE CHECK
Date Time Interrogation Session: 20231222230316
Implantable Pulse Generator Implant Date: 20230322

## 2022-07-17 NOTE — Progress Notes (Signed)
Carelink Summary Report / Loop Recorder 

## 2022-08-11 NOTE — Progress Notes (Signed)
[facility] Summary Report / Loop Recorder

## 2022-08-14 LAB — CUP PACEART REMOTE DEVICE CHECK
Date Time Interrogation Session: 20240124232307
Implantable Pulse Generator Implant Date: 20230322

## 2022-09-16 ENCOUNTER — Ambulatory Visit: Payer: Medicare PPO

## 2022-09-16 DIAGNOSIS — I639 Cerebral infarction, unspecified: Secondary | ICD-10-CM

## 2022-09-16 LAB — CUP PACEART REMOTE DEVICE CHECK
Date Time Interrogation Session: 20240226232028
Implantable Pulse Generator Implant Date: 20230322

## 2022-09-19 DIAGNOSIS — M25552 Pain in left hip: Secondary | ICD-10-CM | POA: Diagnosis not present

## 2022-09-19 DIAGNOSIS — M25562 Pain in left knee: Secondary | ICD-10-CM | POA: Diagnosis not present

## 2022-09-19 DIAGNOSIS — M545 Low back pain, unspecified: Secondary | ICD-10-CM | POA: Diagnosis not present

## 2022-10-14 DIAGNOSIS — M25562 Pain in left knee: Secondary | ICD-10-CM | POA: Diagnosis not present

## 2022-10-20 ENCOUNTER — Ambulatory Visit (INDEPENDENT_AMBULATORY_CARE_PROVIDER_SITE_OTHER): Payer: Medicare PPO

## 2022-10-20 DIAGNOSIS — I639 Cerebral infarction, unspecified: Secondary | ICD-10-CM

## 2022-10-21 DIAGNOSIS — L509 Urticaria, unspecified: Secondary | ICD-10-CM | POA: Diagnosis not present

## 2022-10-21 DIAGNOSIS — Z8673 Personal history of transient ischemic attack (TIA), and cerebral infarction without residual deficits: Secondary | ICD-10-CM | POA: Diagnosis not present

## 2022-10-21 DIAGNOSIS — I6523 Occlusion and stenosis of bilateral carotid arteries: Secondary | ICD-10-CM | POA: Diagnosis not present

## 2022-10-21 DIAGNOSIS — G720 Drug-induced myopathy: Secondary | ICD-10-CM | POA: Diagnosis not present

## 2022-10-21 DIAGNOSIS — E785 Hyperlipidemia, unspecified: Secondary | ICD-10-CM | POA: Diagnosis not present

## 2022-10-21 LAB — CUP PACEART REMOTE DEVICE CHECK
Date Time Interrogation Session: 20240331232423
Implantable Pulse Generator Implant Date: 20230322

## 2022-10-22 IMAGING — MG MM DIGITAL SCREENING BILAT W/ TOMO AND CAD
8 series · 8 of 24 positions shown · non-contrast
Comparison: Previous exam(s).

CLINICAL DATA: Screening.

EXAM:
DIGITAL SCREENING BILATERAL MAMMOGRAM WITH TOMOSYNTHESIS AND CAD
TECHNIQUE: Bilateral screening digital craniocaudal and mediolateral oblique
mammograms were obtained. Bilateral screening digital breast
tomosynthesis was performed. The images were evaluated with
computer-aided detection.

[R MLO synth-2D]
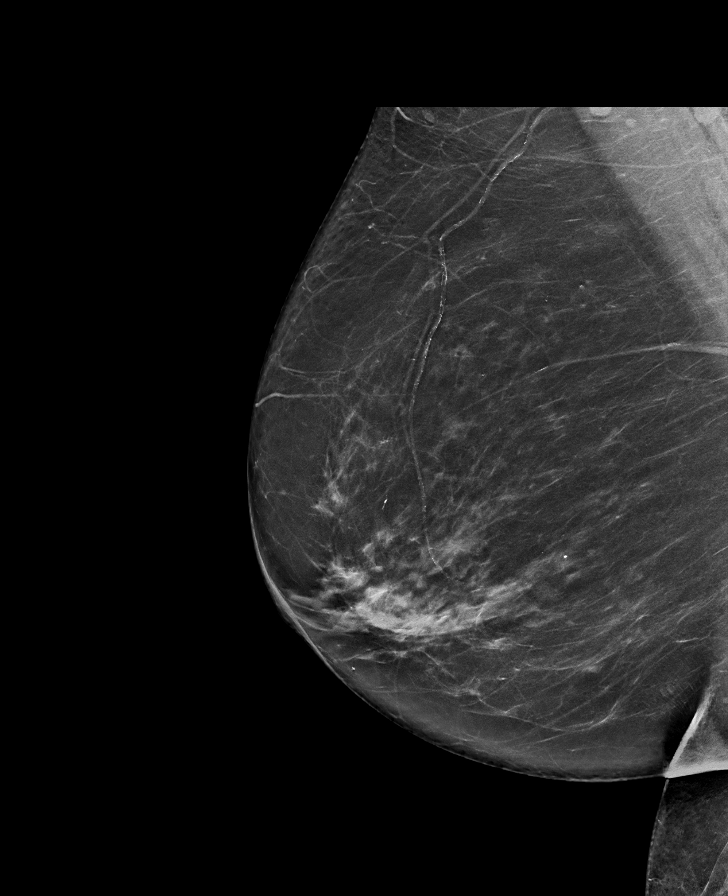

[L CC synth-2D]
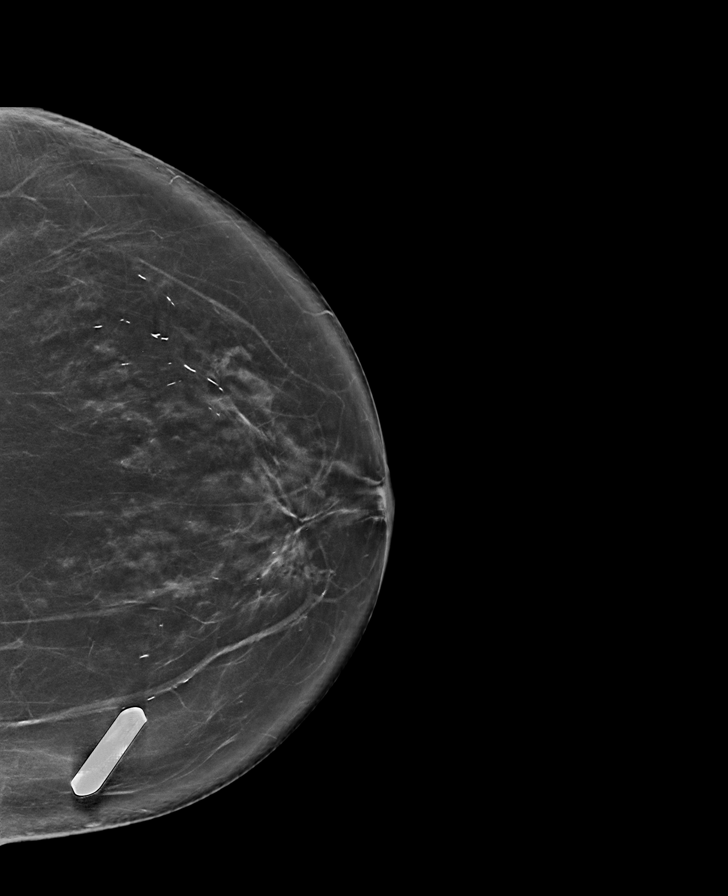

[L MLO synth-2D]
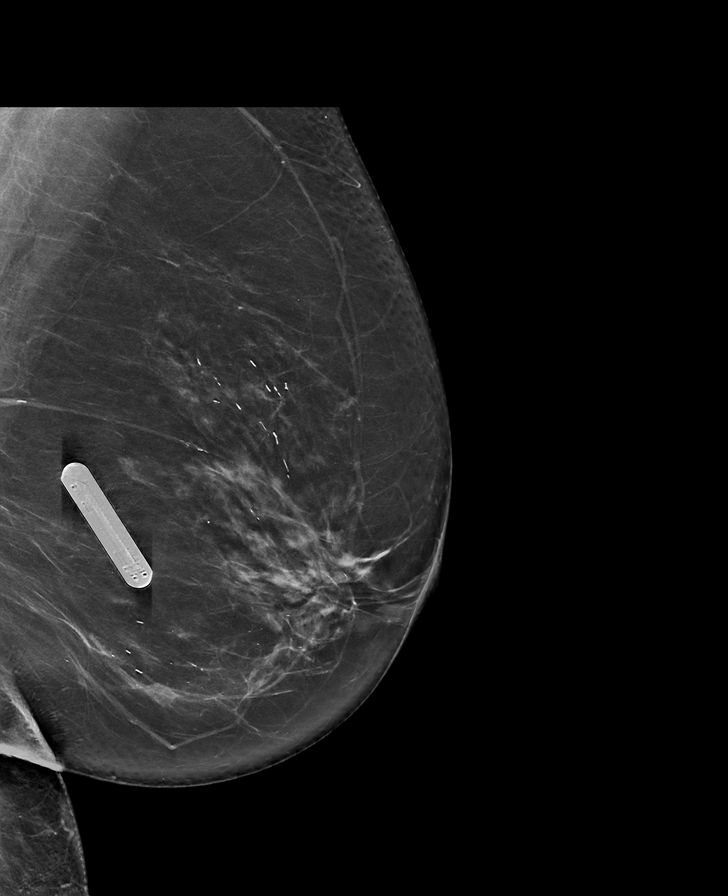

[R CC synth-2D]
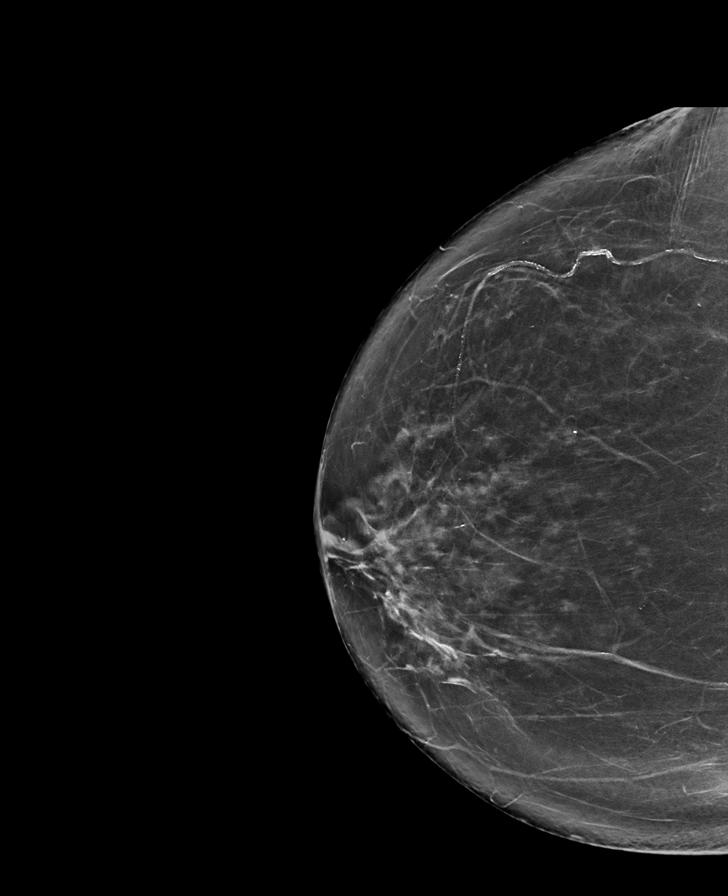

[L MLO tomo · tomo slice 41/80.0]
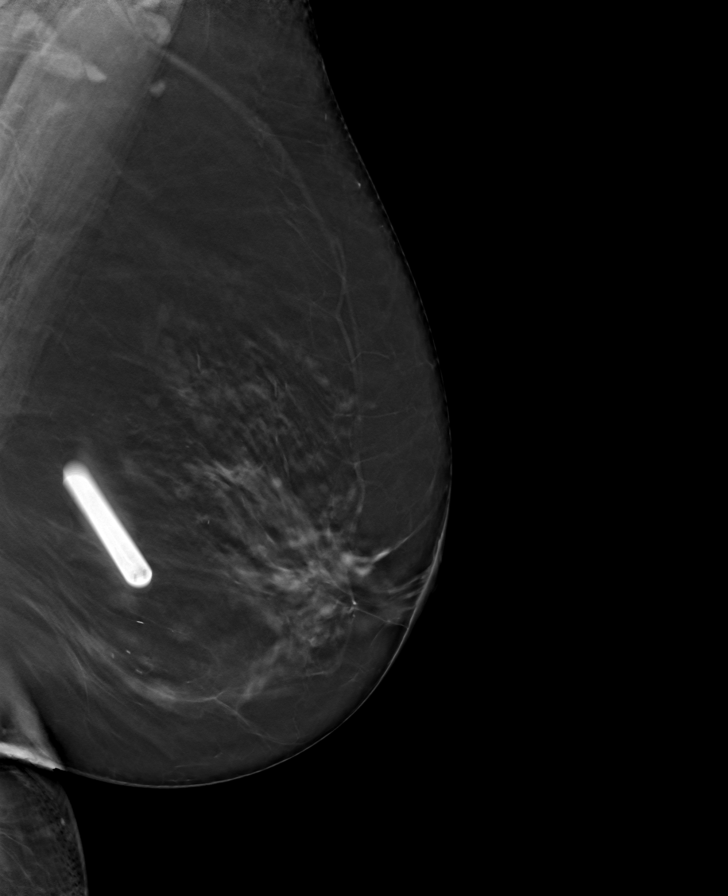

[L CC tomo · tomo slice 41/80.0]
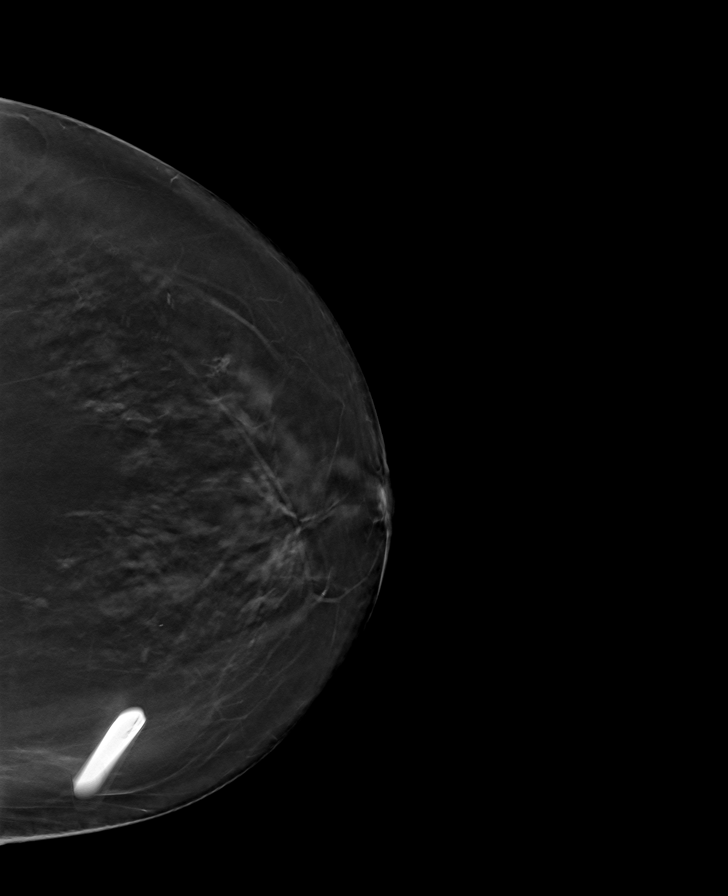

[R CC tomo · tomo slice 39/77.0]
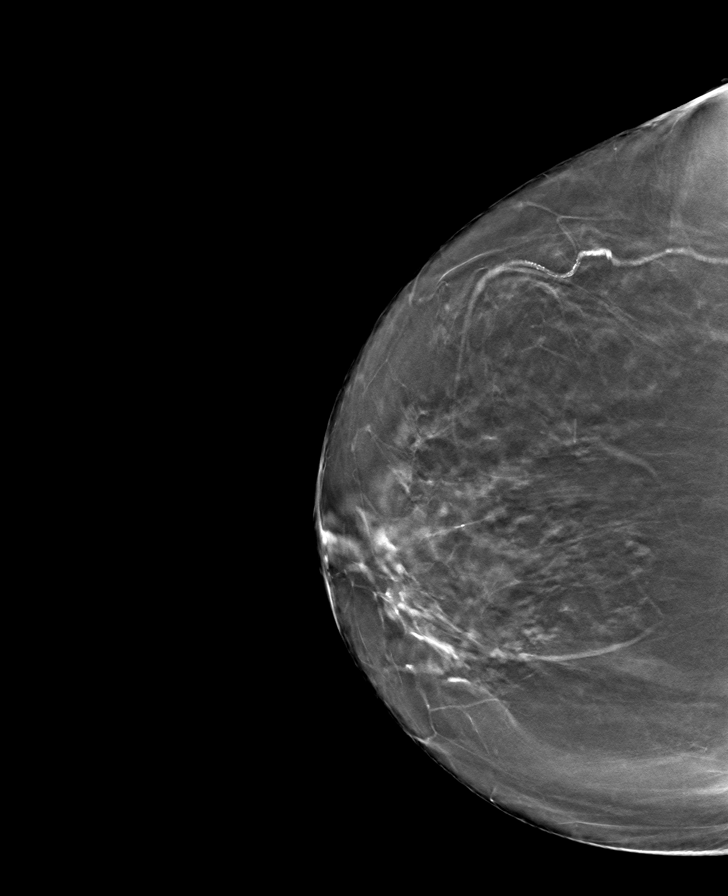

[R MLO tomo · tomo slice 41/82.0]
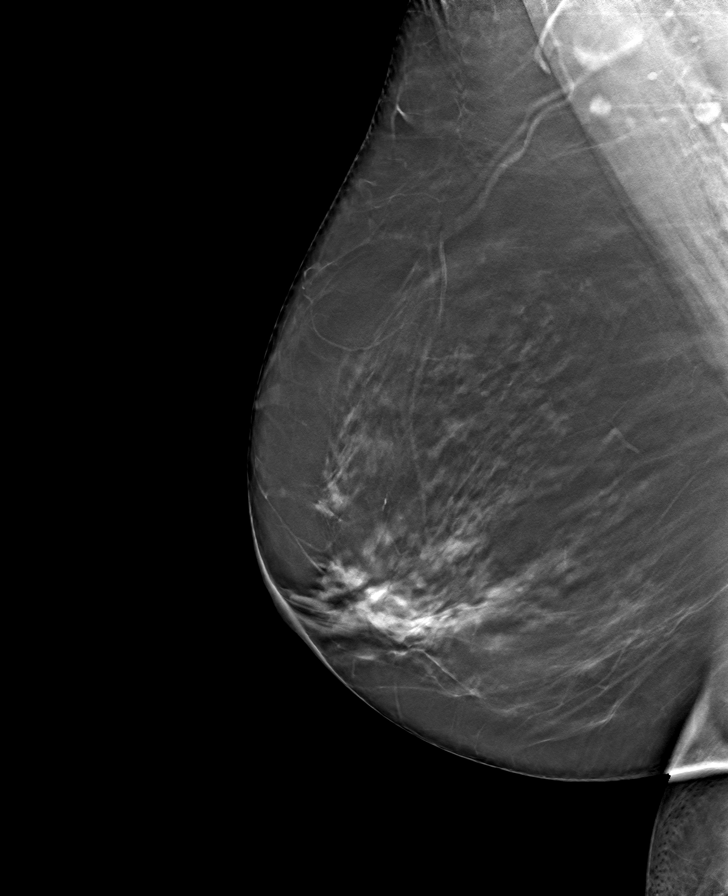

[8 of 24 positions shown; findings below may reference images not displayed]

ACR Breast Density Category b: There are scattered areas of
fibroglandular density.
FINDINGS: There are no findings suspicious for malignancy.
IMPRESSION: No mammographic evidence of malignancy. A result letter of this
screening mammogram will be mailed directly to the patient.

RECOMMENDATION:
Screening mammogram in one year. (Code:51-O-LD2)

BI-RADS CATEGORY  1: Negative.

## 2022-10-22 NOTE — Progress Notes (Signed)
Carelink Summary Report / Loop Recorder 

## 2022-11-11 DIAGNOSIS — D0371 Melanoma in situ of right lower limb, including hip: Secondary | ICD-10-CM | POA: Diagnosis not present

## 2022-11-11 DIAGNOSIS — L570 Actinic keratosis: Secondary | ICD-10-CM | POA: Diagnosis not present

## 2022-11-11 DIAGNOSIS — Z85828 Personal history of other malignant neoplasm of skin: Secondary | ICD-10-CM | POA: Diagnosis not present

## 2022-11-11 DIAGNOSIS — D692 Other nonthrombocytopenic purpura: Secondary | ICD-10-CM | POA: Diagnosis not present

## 2022-11-11 DIAGNOSIS — L820 Inflamed seborrheic keratosis: Secondary | ICD-10-CM | POA: Diagnosis not present

## 2022-11-11 DIAGNOSIS — L821 Other seborrheic keratosis: Secondary | ICD-10-CM | POA: Diagnosis not present

## 2022-11-14 ENCOUNTER — Other Ambulatory Visit: Payer: Self-pay | Admitting: Podiatry

## 2022-11-17 ENCOUNTER — Encounter (HOSPITAL_COMMUNITY): Payer: Self-pay | Admitting: Neurology

## 2022-11-17 ENCOUNTER — Inpatient Hospital Stay (HOSPITAL_COMMUNITY)
Admission: EM | Admit: 2022-11-17 | Discharge: 2022-11-19 | DRG: 063 | Disposition: A | Discharge status: Home / Self Care | Payer: Medicare PPO | Attending: Neurology | Admitting: Neurology

## 2022-11-17 ENCOUNTER — Emergency Department (HOSPITAL_COMMUNITY): Payer: Medicare PPO

## 2022-11-17 ENCOUNTER — Inpatient Hospital Stay (HOSPITAL_COMMUNITY): Payer: Medicare PPO

## 2022-11-17 ENCOUNTER — Other Ambulatory Visit: Payer: Self-pay

## 2022-11-17 DIAGNOSIS — E785 Hyperlipidemia, unspecified: Secondary | ICD-10-CM | POA: Diagnosis present

## 2022-11-17 DIAGNOSIS — Z96651 Presence of right artificial knee joint: Secondary | ICD-10-CM | POA: Diagnosis present

## 2022-11-17 DIAGNOSIS — K219 Gastro-esophageal reflux disease without esophagitis: Secondary | ICD-10-CM | POA: Diagnosis present

## 2022-11-17 DIAGNOSIS — M25552 Pain in left hip: Secondary | ICD-10-CM | POA: Diagnosis not present

## 2022-11-17 DIAGNOSIS — N1831 Chronic kidney disease, stage 3a: Secondary | ICD-10-CM | POA: Diagnosis present

## 2022-11-17 DIAGNOSIS — R4701 Aphasia: Secondary | ICD-10-CM | POA: Diagnosis not present

## 2022-11-17 DIAGNOSIS — Z882 Allergy status to sulfonamides status: Secondary | ICD-10-CM

## 2022-11-17 DIAGNOSIS — Z79899 Other long term (current) drug therapy: Secondary | ICD-10-CM | POA: Diagnosis not present

## 2022-11-17 DIAGNOSIS — Z87891 Personal history of nicotine dependence: Secondary | ICD-10-CM | POA: Diagnosis not present

## 2022-11-17 DIAGNOSIS — I63232 Cerebral infarction due to unspecified occlusion or stenosis of left carotid arteries: Secondary | ICD-10-CM | POA: Diagnosis not present

## 2022-11-17 DIAGNOSIS — Z8673 Personal history of transient ischemic attack (TIA), and cerebral infarction without residual deficits: Secondary | ICD-10-CM

## 2022-11-17 DIAGNOSIS — M791 Myalgia, unspecified site: Secondary | ICD-10-CM | POA: Diagnosis not present

## 2022-11-17 DIAGNOSIS — M549 Dorsalgia, unspecified: Secondary | ICD-10-CM | POA: Diagnosis present

## 2022-11-17 DIAGNOSIS — Z85828 Personal history of other malignant neoplasm of skin: Secondary | ICD-10-CM | POA: Diagnosis not present

## 2022-11-17 DIAGNOSIS — E669 Obesity, unspecified: Secondary | ICD-10-CM | POA: Diagnosis not present

## 2022-11-17 DIAGNOSIS — Z6832 Body mass index (BMI) 32.0-32.9, adult: Secondary | ICD-10-CM

## 2022-11-17 DIAGNOSIS — R7303 Prediabetes: Secondary | ICD-10-CM | POA: Diagnosis present

## 2022-11-17 DIAGNOSIS — Z885 Allergy status to narcotic agent status: Secondary | ICD-10-CM | POA: Diagnosis not present

## 2022-11-17 DIAGNOSIS — E876 Hypokalemia: Secondary | ICD-10-CM | POA: Diagnosis present

## 2022-11-17 DIAGNOSIS — Z7902 Long term (current) use of antithrombotics/antiplatelets: Secondary | ICD-10-CM

## 2022-11-17 DIAGNOSIS — R29818 Other symptoms and signs involving the nervous system: Secondary | ICD-10-CM | POA: Diagnosis not present

## 2022-11-17 DIAGNOSIS — I129 Hypertensive chronic kidney disease with stage 1 through stage 4 chronic kidney disease, or unspecified chronic kidney disease: Secondary | ICD-10-CM | POA: Diagnosis present

## 2022-11-17 DIAGNOSIS — R000 Tachycardia, unspecified: Secondary | ICD-10-CM | POA: Diagnosis not present

## 2022-11-17 DIAGNOSIS — Z888 Allergy status to other drugs, medicaments and biological substances status: Secondary | ICD-10-CM

## 2022-11-17 DIAGNOSIS — I639 Cerebral infarction, unspecified: Principal | ICD-10-CM | POA: Diagnosis present

## 2022-11-17 DIAGNOSIS — R29704 NIHSS score 4: Secondary | ICD-10-CM | POA: Diagnosis not present

## 2022-11-17 DIAGNOSIS — M545 Low back pain, unspecified: Secondary | ICD-10-CM | POA: Diagnosis not present

## 2022-11-17 DIAGNOSIS — I351 Nonrheumatic aortic (valve) insufficiency: Secondary | ICD-10-CM | POA: Diagnosis not present

## 2022-11-17 DIAGNOSIS — R0902 Hypoxemia: Secondary | ICD-10-CM | POA: Diagnosis not present

## 2022-11-17 DIAGNOSIS — G8929 Other chronic pain: Secondary | ICD-10-CM | POA: Diagnosis present

## 2022-11-17 DIAGNOSIS — I6522 Occlusion and stenosis of left carotid artery: Secondary | ICD-10-CM | POA: Diagnosis not present

## 2022-11-17 DIAGNOSIS — M8580 Other specified disorders of bone density and structure, unspecified site: Secondary | ICD-10-CM | POA: Diagnosis present

## 2022-11-17 DIAGNOSIS — M1711 Unilateral primary osteoarthritis, right knee: Secondary | ICD-10-CM | POA: Diagnosis present

## 2022-11-17 DIAGNOSIS — I10 Essential (primary) hypertension: Secondary | ICD-10-CM | POA: Diagnosis not present

## 2022-11-17 LAB — I-STAT CHEM 8, ED
BUN: 22 mg/dL (ref 8–23)
Calcium, Ion: 1.15 mmol/L (ref 1.15–1.40)
Chloride: 102 mmol/L (ref 98–111)
Creatinine, Ser: 1 mg/dL (ref 0.44–1.00)
Glucose, Bld: 201 mg/dL — ABNORMAL HIGH (ref 70–99)
HCT: 42 % (ref 36.0–46.0)
Hemoglobin: 14.3 g/dL (ref 12.0–15.0)
Potassium: 2.8 mmol/L — ABNORMAL LOW (ref 3.5–5.1)
Sodium: 139 mmol/L (ref 135–145)
TCO2: 23 mmol/L (ref 22–32)

## 2022-11-17 LAB — COMPREHENSIVE METABOLIC PANEL
ALT: 18 U/L (ref 0–44)
AST: 24 U/L (ref 15–41)
Albumin: 3.5 g/dL (ref 3.5–5.0)
Alkaline Phosphatase: 51 U/L (ref 38–126)
Anion gap: 17 — ABNORMAL HIGH (ref 5–15)
BUN: 21 mg/dL (ref 8–23)
CO2: 21 mmol/L — ABNORMAL LOW (ref 22–32)
Calcium: 9.6 mg/dL (ref 8.9–10.3)
Chloride: 99 mmol/L (ref 98–111)
Creatinine, Ser: 1.13 mg/dL — ABNORMAL HIGH (ref 0.44–1.00)
GFR, Estimated: 49 mL/min — ABNORMAL LOW (ref >60)
Glucose, Bld: 203 mg/dL — ABNORMAL HIGH (ref 70–99)
Potassium: 2.8 mmol/L — ABNORMAL LOW (ref 3.5–5.1)
Sodium: 137 mmol/L (ref 135–145)
Total Bilirubin: 0.4 mg/dL (ref 0.3–1.2)
Total Protein: 6.4 g/dL — ABNORMAL LOW (ref 6.5–8.1)

## 2022-11-17 LAB — CBC
HCT: 40.7 % (ref 36.0–46.0)
Hemoglobin: 14.4 g/dL (ref 12.0–15.0)
MCH: 34.8 pg — ABNORMAL HIGH (ref 26.0–34.0)
MCHC: 35.4 g/dL (ref 30.0–36.0)
MCV: 98.3 fL (ref 80.0–100.0)
Platelets: 265 10*3/uL (ref 150–400)
RBC: 4.14 MIL/uL (ref 3.87–5.11)
RDW: 12.5 % (ref 11.5–15.5)
WBC: 6 10*3/uL (ref 4.0–10.5)
nRBC: 0 % (ref 0.0–0.2)

## 2022-11-17 LAB — PROTIME-INR
INR: 1.1 (ref 0.8–1.2)
Prothrombin Time: 13.7 seconds (ref 11.4–15.2)

## 2022-11-17 LAB — CBG MONITORING, ED
Glucose-Capillary: 201 mg/dL — ABNORMAL HIGH (ref 70–99)
Glucose-Capillary: 164 mg/dL — ABNORMAL HIGH (ref 70–99)

## 2022-11-17 LAB — DIFFERENTIAL
Abs Immature Granulocytes: 0.06 10*3/uL (ref 0.00–0.07)
Basophils Absolute: 0 10*3/uL (ref 0.0–0.1)
Basophils Relative: 1 %
Eosinophils Absolute: 0.1 10*3/uL (ref 0.0–0.5)
Eosinophils Relative: 2 %
Immature Granulocytes: 1 %
Lymphocytes Relative: 21 %
Lymphs Abs: 1.3 10*3/uL (ref 0.7–4.0)
Monocytes Absolute: 0.7 10*3/uL (ref 0.1–1.0)
Monocytes Relative: 11 %
Neutro Abs: 3.8 10*3/uL (ref 1.7–7.7)
Neutrophils Relative %: 64 %

## 2022-11-17 LAB — APTT: aPTT: 28 seconds (ref 24–36)

## 2022-11-17 LAB — ETHANOL: Alcohol, Ethyl (B): <10 mg/dL (ref <10)

## 2022-11-17 MED ORDER — IOHEXOL 350 MG/ML SOLN
75.0000 mL | Freq: Once | INTRAVENOUS | Status: AC | PRN
  Administered 2022-11-17: 75 mL via INTRAVENOUS

## 2022-11-17 MED ORDER — LABETALOL HCL 5 MG/ML IV SOLN: 10.0000 mg | INTRAVENOUS | Status: DC | PRN

## 2022-11-17 MED ORDER — ACETAMINOPHEN 650 MG RE SUPP: 650.0000 mg | RECTAL | Status: DC | PRN

## 2022-11-17 MED ORDER — ACETAMINOPHEN 325 MG PO TABS: 650.0000 mg | ORAL_TABLET | ORAL | Status: DC | PRN

## 2022-11-17 MED ORDER — SODIUM CHLORIDE 0.9% FLUSH: 3.0000 mL | Freq: Once | INTRAVENOUS | Status: DC

## 2022-11-17 MED ORDER — STROKE: EARLY STAGES OF RECOVERY BOOK: Freq: Once | Status: AC

## 2022-11-17 MED ORDER — HYDRALAZINE HCL 20 MG/ML IJ SOLN: 5.0000 mg | INTRAMUSCULAR | Status: DC | PRN

## 2022-11-17 MED ORDER — SENNOSIDES-DOCUSATE SODIUM 8.6-50 MG PO TABS: 1.0000 | ORAL_TABLET | Freq: Every evening | ORAL | Status: DC | PRN

## 2022-11-17 MED ORDER — SODIUM CHLORIDE 0.9 % IV SOLN: INTRAVENOUS | Status: AC

## 2022-11-17 MED ORDER — PANTOPRAZOLE SODIUM 40 MG IV SOLR
40.0000 mg | Freq: Every day | INTRAVENOUS | Status: DC
  Administered 2022-11-17: 40 mg via INTRAVENOUS
  Filled 2022-11-17: qty 10

## 2022-11-17 MED ORDER — ACETAMINOPHEN 160 MG/5ML PO SOLN: 650.0000 mg | ORAL | Status: DC | PRN

## 2022-11-17 MED ORDER — TENECTEPLASE FOR STROKE
0.2500 mg/kg | PACK | Freq: Once | INTRAVENOUS | Status: AC
  Administered 2022-11-17: 23 mg via INTRAVENOUS
  Filled 2022-11-17: qty 10

## 2022-11-17 MED ORDER — CHLORHEXIDINE GLUCONATE CLOTH 2 % EX PADS
6.0000 | MEDICATED_PAD | Freq: Every day | CUTANEOUS | Status: DC
  Administered 2022-11-18: 6 via TOPICAL

## 2022-11-17 NOTE — Progress Notes (Signed)
PHARMACIST CODE STROKE RESPONSE  Notified to mix TNK at 2159 by Dr. Iver Nestle TNK preparation completed at 2202  TNK dose = 23 mg IV over 5 seconds  Issues/delays encountered (if applicable): N/A  Cathie Hoops 11/17/22 10:02 PM

## 2022-11-17 NOTE — H&P (Signed)
Neurology H&P  CC: Loss of speech  History is obtained from:   HPI: Ebony [name] is a 82 y.o. right-handed woman with a past medical history significant for hypertension, hyperlipidemia, prediabetes, remote stroke (10/08/2021 with residual mild left hand weakness/numbness), CKD stage IIIa, right knee osteoarthritis, L4/L5 spinal stenosis s/p laminectomy  She is having dinner with her family when she had acute onset difficulty understanding what her family was saying to her, responding strangely to questions using incorrect words, and starting to eat her beings with her hands instead of her fork.  No focal weakness appreciated, almost nonverbal on initial EMS arrival and just laughing inappropriately.  Speech slightly improving by the time of arrival to ED, but remained clearly aphasic.  Family notes that she did have a steroid injection for pain in the lumbar back today, but this was not epidural or lumbar puncture.  They do note she has been having some dyspnea on exertion that is increased in the last few weeks.    She notes her Repatha was recently stopped it due to itching complications and potentially muscleaches and she has started atorvastatin 10 mg 3 times weekly instead  LKW: 21:07 Thrombolytic given?: Yes at 22:02  Checklist of contraindications was reviewed and negative. Risks, benefits and alternatives were discussed  IA performed?: No,  Premorbid modified rankin scale:      1 - No significant disability. Able to carry out all usual activities, despite some symptoms.   ROS: All other review of systems was negative except as noted in the HPI, within limits of aphasia  Past Medical History:  Diagnosis Date   Arthritis    Cancer ([facility])    BASAL CELL CA   Depression    GERD (gastroesophageal reflux disease)    TAKES TUMS PRN   Hypertension    Osteopenia    Stroke ([facility])    TIA 2004- PT IS ON PLAVIX   Past Surgical History:  Procedure Laterality Date   BUNIONECTOMY Right     CARDIAC CATHETERIZATION     CATARACT EXTRACTION Bilateral    KNEE ARTHROPLASTY Right 11/29/2015   Procedure: RIGHT TOTAL KNEE ARTHROPLASTY ;  Surgeon: Samson Frederic, MD;  Location: [facility];  Service: Orthopedics;  Laterality: Right;   KNEE ARTHROSCOPY Right 10/05/2013   Procedure: RIGHT KNEE ARTHROSCOPY WITH medial and lateral DEBRIDEMENT chrondroplasty;  Surgeon: Loanne Drilling, MD;  Location: [facility];  Service: Orthopedics;  Laterality: Right;   LOOP RECORDER INSERTION N/A 10/09/2021   Procedure: LOOP RECORDER INSERTION;  Surgeon: Marinus Maw, MD;  Location: [facility];  Service: Cardiovascular;  Laterality: N/A;   LUMBAR LAMINECTOMY/DECOMPRESSION MICRODISCECTOMY N/A 09/12/2021   Procedure: Lumbar decompression Lumbar three - Lumbar four and lumbar four-five;  Surgeon: Jene Every, MD;  Location: [facility];  Service: Orthopedics;  Laterality: N/A;   MOHS SURGERY ON NOSE 01/2007, MOHS ON FACE 08/2012     TONSILLECTOMY     Family History  Problem Relation Age of Onset   Breast cancer Sister     Social History:  reports that she quit smoking about 51 years ago. Her smoking use included cigarettes. She has a 10.00 pack-year smoking history. She has never used smokeless tobacco. She reports current alcohol use. She reports that she does not use drugs.   Exam: Current vital signs: BP (!) 166/100   Pulse (!) 55   Temp 98 F (36.7 C) (Oral)   Resp (!) 24   Ht 5\' 6"  (1.676 m)  Wt 92.6 kg   SpO2 96%   BMI 32.95 kg/m  Vital signs in last 24 hours: Temp:  [98 F (36.7 C)-98.2 F (36.8 C)] 98 F (36.7 C) (04/30 0000) Pulse Rate:  [55-95] 55 (04/30 0030) Resp:  [15-28] 24 (04/30 0030) BP: (138-166)/(66-105) 166/100 (04/30 0030) SpO2:  [92 %-96 %] 96 % (04/30 0030) Weight:  [92.6 kg] 92.6 kg (04/29 Dec 15, 2235)   Physical Exam  Constitutional: Appears well-developed and well-nourished.  Psych: Affect appropriate to situation, pleasant and cooperative Eyes: No scleral  injection HENT: No oropharyngeal obstruction.  MSK: no joint deformities.  Cardiovascular: Normal rate and regular rhythm. Perfusing extremities well Respiratory: Effort normal, non-labored breathing GI: Soft.  No distension. There is no tenderness.  Skin: Warm dry and intact visible skin  Neuro: Mental Status: Patient is awake, alert, oriented to person, place, but incorrectly states year and age, cannot provide significant history, makes frequent errors/word substitutions and often states "I do not understand what you are saying" Cranial Nerves: II: Visual Fields are full.  III,IV, VI: EOMI without ptosis or diploplia.  V: Facial sensation is symmetric to light eyelash brush VII: Facial movement is symmetric.  VIII: hearing is intact to voice X: Uvula elevates symmetrically XI: Shoulder shrug is symmetric. XII: tongue is midline without atrophy or fasciculations.  Motor: Tone is normal. Bulk is normal.  No pronator drift.  Slight symmetric bilateral drift of the lower extremities.  As her language improved was able to complain of some cramping in the left thigh with elevation Sensory: Sensation is symmetric to light touch throughout Deep Tendon Reflexes: 2+ and symmetric in the patella Plantars: Toes are downgoing bilaterally.  Cerebellar: FNF and HKS are intact bilaterally Gait:  Deferred in acute setting   NIHSS total 4 Score breakdown: 1 point for not being able to answer month correctly (initially most reported age but self corrected) 1 point for left leg weakness, 1 point for right leg weakness, 1 point for mild to moderate aphasia Performed at  time of patient arrival to ED    I have reviewed labs in epic and the results pertinent to this consultation are:  Basic Metabolic Panel: Recent Labs  Lab 11/17/22 12/14/45 11/17/22 12-15-2147  NA 137 139  K 2.8* 2.8*  CL 99 102  CO2 21*  --   GLUCOSE 203* 201*  BUN 21 22  CREATININE 1.13* 1.00  CALCIUM 9.6  --      CBC: Recent Labs  Lab 11/17/22 12/14/45 11/17/22 2147-12-15  WBC 6.0  --   NEUTROABS 3.8  --   HGB 14.4 14.3  HCT 40.7 42.0  MCV 98.3  --   PLT 265  --     Coagulation Studies: Recent Labs    11/17/22 12/14/45  LABPROT 13.7  INR 1.1      I have reviewed the images obtained:  Head CT personally reviewed, agree with radiology no acute intracranial process, ASPECTS 10  CTA personally reviewed, agree with radiology: 1. No emergent large vessel occlusion or high-grade stenosis of the intracranial arteries. 2. No dissection, aneurysm or hemodynamically significant stenosis of the carotid or vertebral arteries.  Impression: Likely acute left MCA territory stroke based on patient's significant aphasia.  While this was improving somewhat at the time of my evaluation, she remained significantly confused which would be a very disabling deficit in this otherwise highly independent and functional patient.  In discussion with family by phone, decision was made to proceed with TNK, and she will  be admitted for full stroke workup  Recommendations:  # Left MCA stroke based on clinical assessment - Stroke labs HgbA1c, fasting lipid panel - MRI brain 24 hours post TNK - Frequent neuro checks per post TNK protocol - Echocardiogram - Hold antiplatelets for at least 24 hours post TNK - SCDs for DVT prophylaxis - Risk factor modification - Telemetry monitoring; interrogation of loop recorder for arrhythmias - Blood pressure goal   - Post tPA for 24  hours < 180/105 - PT consult, OT consult, Speech consult, unless patient is back to baseline - Admitted to stroke team  # Hypokalemia -40 meq p.o. potassium as patient has a passed swallow screen -Check magnesium -Repeat BMP in the morning  #Prediabetes -Carb controlled heart healthy diet -Very low-dose sliding scale insulin correctional scale with meals -Repeat A1c pending  #Recent lumbar steroid injection -Monitor closely for bleeding  complications, nursing notified  Confirmed with patient and family at bedside she is full CODE STATUS  Brooke Dare MD-PhD [facility] [phone] Available 7 PM to 7 AM, outside of these hours please call Neurologist on call as listed on Amion.   Total critical care time: 60 minutes   Critical care time was exclusive of separately billable procedures and treating other patients.   Critical care was necessary to treat or prevent imminent or life-threatening deterioration.   Critical care was time spent personally by me on the following activities: development of treatment plan with patient and/or surrogate as well as nursing, discussions with consultants/primary team, evaluation of patient's response to treatment, examination of patient, obtaining history from patient or surrogate, ordering and performing treatments and interventions, ordering and review of laboratory studies, ordering and review of radiographic studies, and re-evaluation of patient's condition as needed, as documented above.

## 2022-11-17 NOTE — ED Provider Notes (Signed)
[place] EMERGENCY DEPARTMENT AT [facility] Provider Note   CSN: [id number] Arrival date & time: 11/17/22  2141  An emergency department physician performed an initial assessment on this suspected stroke patient at 2148.  History  Chief Complaint  Patient presents with   Code Stroke    Code stroke @2118  pt arrived from home via ems at 2141 EDP completed taken to ct    Ebony [name] is a 82 y.o. female.  HPI   This patient is a very pleasant 82 year old female, she has a history of hypertension, she has had a prior stroke, she is on Plavix, she was at dinner with her family tonight about 45 minutes prior to arrival at 9:00 PM while while she was having dinner she was eating with her fork, the family noticed that she put the fork down and started eating with her hands which is very unusual in foods that were not meant to be eating with hands.  She then started to say strange things and answer questions inappropriately, she was excessively giddy and laughing inappropriately.  This seems to have improved and by the time of arrival has resolved.  The patient does not have a headache.  Home Medications Prior to Admission medications   Medication Sig Start Date End Date Taking? Authorizing Provider  acetaminophen (TYLENOL) 500 MG tablet Take 1,000 mg by mouth every 6 (six) hours as needed for headache (pain).    [provider]  allopurinol (ZYLOPRIM) 100 MG tablet Take 100 mg by mouth every morning. 11/23/18   [provider]  ALPRAZolam Prudy Feeler) 0.5 MG tablet Take 0.5 mg by mouth at bedtime as needed for anxiety or sleep.  07/19/14   [provider]  citalopram (CELEXA) 20 MG tablet Take 20 mg by mouth every morning. 11/07/15   [provider]  clopidogrel (PLAVIX) 75 MG tablet Take 75 mg by mouth every morning.    [provider]  Evolocumab (REPATHA) 140 MG/ML SOSY Inject 140 mg into the skin every 14 (fourteen) days.    [provider]  meclizine (ANTIVERT) 25 MG tablet Take by mouth. 11/18/21   [provider]  ondansetron (ZOFRAN) 4 MG tablet  11/05/21   [provider]  terbinafine (LAMISIL) 250 MG tablet Take 1 tablet (250 mg total) by mouth daily. 02/11/22   Hyatt, Max T, DPM  triamterene-hydrochlorothiazide (DYAZIDE) 37.5-25 MG capsule Take 1 capsule by mouth every morning. 11/07/15   [provider]      Allergies    Other, Codeine, Statins, and Sulfa antibiotics    Review of Systems   Review of Systems  All other systems reviewed and are negative.   Physical Exam Updated Vital Signs BP 138/73   Pulse 85   Temp 98.2 F (36.8 C) (Oral)   Resp 19   Ht 1.676 m (5\' 6" )   Wt 92.6 kg   SpO2 96%   BMI 32.95 kg/m  Physical Exam Vitals and nursing note reviewed.  Constitutional:      General: She is not in acute distress.    Appearance: She is well-developed.  HENT:     Head: Normocephalic and atraumatic.     Mouth/Throat:     Pharynx: No oropharyngeal exudate.  Eyes:     General: No scleral icterus.       Right eye: No discharge.        Left eye: No discharge.     Conjunctiva/sclera: Conjunctivae normal.  Pupils: Pupils are equal, round, and reactive to light.  Neck:     Thyroid: No thyromegaly.     Vascular: No JVD.  Cardiovascular:     Rate and Rhythm: Normal rate and regular rhythm.     Heart sounds: Normal heart sounds. No murmur heard.    No friction rub. No gallop.  Pulmonary:     Effort: Pulmonary effort is normal. No respiratory distress.     Breath sounds: Normal breath sounds. No wheezing or rales.  Abdominal:     General: Bowel sounds are normal. There is no distension.     Palpations: Abdomen is soft. There is no mass.     Tenderness: There is no abdominal tenderness.  Musculoskeletal:        General: No tenderness. Normal range of motion.     Cervical back: Normal range of motion and neck supple.  Lymphadenopathy:     Cervical: No  cervical adenopathy.  Skin:    General: Skin is warm and dry.     Findings: No erythema or rash.  Neurological:     Mental Status: She is alert.     Coordination: Coordination normal.     Comments: This patient is able to move all 4 extremities, there is normal strength in all 4 extremities, there is no pronator drift, she has the ability to do finger-nose-finger, her visual fields appear normal and her extraocular movements are intact without diplopia.  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  Psychiatric:        Behavior: Behavior normal.     ED Results / Procedures / Treatments   Labs (all labs ordered are listed, but only abnormal results are displayed) Labs Reviewed  CBC - Abnormal; Notable for the following components:      Result Value   MCH 34.8 (*)    All other components within normal limits  COMPREHENSIVE METABOLIC PANEL - Abnormal; Notable for the following components:   Potassium 2.8 (*)    CO2 21 (*)    Glucose, Bld 203 (*)    Creatinine, Ser 1.13 (*)    Total Protein 6.4 (*)    GFR, Estimated 49 (*)    Anion gap 17 (*)    All other components within normal limits  I-STAT CHEM 8, ED - Abnormal; Notable for the following components:   Potassium 2.8 (*)    Glucose, Bld 201 (*)    All other components within normal limits  CBG MONITORING, ED - Abnormal; Notable for the following components:   Glucose-Capillary 201 (*)    All other components within normal limits  CBG MONITORING, ED - Abnormal; Notable for the following components:   Glucose-Capillary 164 (*)    All other components within normal limits  PROTIME-INR  APTT  DIFFERENTIAL  ETHANOL  LIPID PANEL  HEMOGLOBIN A1C    EKG None  Radiology CT ANGIO HEAD NECK W WO CM (CODE STROKE)  Result Date: 11/17/2022 CLINICAL DATA:  Acute neurologic deficits EXAM: CT ANGIOGRAPHY HEAD AND NECK  TECHNIQUE: Multidetector CT imaging of the head and neck was performed using the standard protocol during bolus administration of intravenous contrast. Multiplanar CT image reconstructions and MIPs were obtained to evaluate the vascular anatomy. Carotid stenosis measurements (when applicable) are obtained utilizing NASCET criteria, using the distal internal carotid diameter as the denominator. RADIATION DOSE REDUCTION: This exam was performed according to  the departmental dose-optimization program which includes automated exposure control, adjustment of the mA and/or kV according to patient size and/or use of iterative reconstruction technique. CONTRAST:  75mL OMNIPAQUE IOHEXOL 350 MG/ML SOLN COMPARISON:  None Available. FINDINGS: CTA NECK FINDINGS SKELETON: There is no bony spinal canal stenosis. No lytic or blastic lesion. OTHER NECK: Normal pharynx, larynx and major salivary glands. No cervical lymphadenopathy. Unremarkable thyroid gland. UPPER CHEST: Biapical septal thickening. AORTIC ARCH: There is calcific atherosclerosis of the aortic arch. There is no aneurysm, dissection or hemodynamically significant stenosis of the visualized portion of the aorta. Conventional 3 vessel aortic branching pattern. The visualized proximal subclavian arteries are widely patent. RIGHT CAROTID SYSTEM: Normal without aneurysm, dissection or stenosis. LEFT CAROTID SYSTEM: No dissection, occlusion or aneurysm. Mild atherosclerotic calcification at the carotid bifurcation without hemodynamically significant stenosis. VERTEBRAL ARTERIES: Left dominant configuration. Both origins are clearly patent. There is no dissection, occlusion or flow-limiting stenosis to the skull base (V1-V3 segments). CTA HEAD FINDINGS POSTERIOR CIRCULATION: --Vertebral arteries: Normal V4 segments. --Inferior cerebellar arteries: Normal. --Basilar artery: Normal. --Superior cerebellar arteries: Normal. --Posterior cerebral arteries (PCA): Normal. ANTERIOR  CIRCULATION: --Intracranial internal carotid arteries: Atherosclerotic calcification of the internal carotid arteries at the skull base without hemodynamically significant stenosis. --Anterior cerebral arteries (ACA): Normal. Both A1 segments are present. Patent anterior communicating artery (a-comm). --Middle cerebral arteries (MCA): Normal. VENOUS SINUSES: As permitted by contrast timing, patent. ANATOMIC VARIANTS: None Review of the MIP images confirms the above findings. IMPRESSION: 1. No emergent large vessel occlusion or hemodynamically significant stenosis of the head or neck. 2. Biapical septal thickening, which may indicate pulmonary edema. Aortic atherosclerosis (ICD10-I70.0). Electronically Signed   By: Deatra Robinson M.D.   On: 11/17/2022 22:32   CT HEAD CODE STROKE WO CONTRAST  Result Date: 11/17/2022 CLINICAL DATA:  Code stroke.  Aphasia EXAM: CT HEAD WITHOUT CONTRAST TECHNIQUE: Contiguous axial images were obtained from the base of the skull through the vertex without intravenous contrast. RADIATION DOSE REDUCTION: This exam was performed according to the departmental dose-optimization program which includes automated exposure control, adjustment of the mA and/or kV according to patient size and/or use of iterative reconstruction technique. COMPARISON:  None Available. FINDINGS: Brain: There is no mass, hemorrhage or extra-axial collection. The size and configuration of the ventricles and extra-axial CSF spaces are normal. There is hypoattenuation of the periventricular white matter, most commonly indicating chronic ischemic microangiopathy. Old left cerebellar infarct. Vascular: No abnormal hyperdensity of the major intracranial arteries or dural venous sinuses. No intracranial atherosclerosis. Skull: The visualized skull base, calvarium and extracranial soft tissues are normal. Sinuses/Orbits: No fluid levels or advanced mucosal thickening of the visualized paranasal sinuses. No mastoid or middle  ear effusion. The orbits are normal. ASPECTS [facility] Stroke Program Early CT Score) - Ganglionic level infarction (caudate, lentiform nuclei, internal capsule, insula, M1-M3 cortex): 7 - Supraganglionic infarction (M4-M6 cortex): 3 Total score (0-10 with 10 being normal): 10 IMPRESSION: 1. No acute intracranial abnormality. 2. ASPECTS is 10. These results were communicated to Dr. Brooke Dare at 9:57 pm on 11/17/2022 by text page via the [facility] messaging system. Electronically Signed   By: Deatra Robinson M.D.   On: 11/17/2022 21:58    Procedures .Critical Care  Performed by: Eber Hong, MD Authorized by: Eber Hong, MD   Critical care provider statement:    Critical care time (minutes):  45   Critical care time was exclusive of:  Separately billable procedures and treating other patients   Critical care  was time spent personally by me on the following activities:  Development of treatment plan with patient or surrogate, discussions with consultants, evaluation of patient's response to treatment, examination of patient, obtaining history from patient or surrogate, review of old charts, re-evaluation of patient's condition, pulse oximetry, ordering and review of radiographic studies, ordering and review of laboratory studies and ordering and performing treatments and interventions   I assumed direction of critical care for this patient from another provider in my specialty: no     Care discussed with: admitting provider   Comments:           Medications Ordered in ED Medications  sodium chloride flush (NS) 0.9 % injection 3 mL (has no administration in time range)   stroke: early stages of recovery book (has no administration in time range)  0.9 %  sodium chloride infusion (has no administration in time range)  acetaminophen (TYLENOL) tablet 650 mg (has no administration in time range)    Or  acetaminophen (TYLENOL) 160 MG/5ML solution 650 mg (has no administration in time range)    Or   acetaminophen (TYLENOL) suppository 650 mg (has no administration in time range)  senna-docusate (Senokot-S) tablet 1 tablet (has no administration in time range)  pantoprazole (PROTONIX) injection 40 mg (has no administration in time range)  labetalol (NORMODYNE) injection 10 mg (has no administration in time range)  hydrALAZINE (APRESOLINE) injection 5 mg (has no administration in time range)  tenecteplase (TNKASE) injection for Stroke 23 mg (23 mg Intravenous Given 11/17/22 2202)  iohexol (OMNIPAQUE) 350 MG/ML injection 75 mL (75 mLs Intravenous Contrast Given 11/17/22 2220)    ED Course/ Medical Decision Making/ A&P                             Medical Decision Making Amount and/or Complexity of Data Reviewed Labs: ordered. Radiology: ordered.  Risk Decision regarding hospitalization.     This patient presents to the ED for concern of acute onset of difficulty speaking, this involves an extensive number of treatment options, and is a complaint that carries with it a high risk of complications and morbidity.  The differential diagnosis includes stroke, seizure, hemorrhage, electrolyte abnormalities   Co morbidities that complicate the patient evaluation  History of basal cell cancer, acid reflux, hypertension and a prior stroke in 2004   Additional history obtained:  Additional history obtained from paramedics External records from outside source obtained and reviewed including medical record, no recent admissions to the hospital, multiple office visits, followed by cardiology   Lab Tests:  I Ordered, and personally interpreted labs.  The pertinent results include: Hyperglycemia is minimal at 200, she is hypokalemic at 2.8 and will need replacement.  Blood counts are normal, metabolic panel is otherwise unremarkable,   Imaging Studies ordered:  I ordered imaging studies including CT angiogram of the head, no acute hemorrhage I independently visualized and interpreted  imaging which showed no acute hemorrhage I agree with the radiologist interpretation   Cardiac Monitoring: / EKG:  The patient was maintained on a cardiac monitor.  I personally viewed and interpreted the cardiac monitored which showed an underlying rhythm of: Sinus rhythm   Consultations Obtained:  I requested consultation with the neurohospitalist Dr. Iver Nestle,  and discussed lab and imaging findings as well as pertinent plan - they recommend: TNK and admission to higher level of care overnight   Problem List / ED Course / Critical interventions / Medication  management  Patient is critically ill with what appears to be an acute ischemic stroke, given thrombolytic therapy Reevaluation of the patient after these medicines showed that the patient improved I have reviewed the patients home medicines and have made adjustments as needed   Social Determinants of Health:  Acute ischemic stroke Critically ill   Test / Admission - Considered:  Admit to high level of care          Final Clinical Impression(s) / ED Diagnoses Final diagnoses:  Cerebrovascular accident (CVA), unspecified mechanism (HCC)    Rx / DC Orders ED Discharge Orders     None         Eber Hong, MD 11/17/22 2258

## 2022-11-17 NOTE — Code Documentation (Signed)
Stroke Response Nurse Documentation Code Documentation  Ebony [name] is a 82 y.o. female arriving to [facility]  via [facility] on 4/29 with past medical hx of CVA, HTN, cancer. On clopidogrel 75 mg daily. Code stroke was activated by EMS.   Patient from home where she was LKW at 2100 and now complaining of trouble speaking.   Stroke team at the bedside on patient arrival. Labs drawn and patient cleared for CT by Dr. Hyacinth Meeker. Patient to CT with team. NIHSS 2, see documentation for details and code stroke times. Patient with disoriented and Expressive aphasia  on exam. The following imaging was completed:  CT Head and CTA. Patient is a candidate for IV Thrombolytic due to fixed neurological deficit. Patient is not a candidate for IR due to No LVO.   Care Plan: TNKase.   Bedside handoff with ED RN Aggie Cosier.    Rose Fillers  Rapid Response RN

## 2022-11-18 ENCOUNTER — Inpatient Hospital Stay (HOSPITAL_COMMUNITY): Payer: Medicare PPO

## 2022-11-18 DIAGNOSIS — Z8673 Personal history of transient ischemic attack (TIA), and cerebral infarction without residual deficits: Secondary | ICD-10-CM | POA: Diagnosis not present

## 2022-11-18 DIAGNOSIS — I351 Nonrheumatic aortic (valve) insufficiency: Secondary | ICD-10-CM

## 2022-11-18 DIAGNOSIS — I63232 Cerebral infarction due to unspecified occlusion or stenosis of left carotid arteries: Secondary | ICD-10-CM | POA: Diagnosis not present

## 2022-11-18 LAB — BASIC METABOLIC PANEL
Anion gap: 9 (ref 5–15)
BUN: 20 mg/dL (ref 8–23)
CO2: 25 mmol/L (ref 22–32)
Calcium: 8.9 mg/dL (ref 8.9–10.3)
Chloride: 103 mmol/L (ref 98–111)
Creatinine, Ser: 1.1 mg/dL — ABNORMAL HIGH (ref 0.44–1.00)
GFR, Estimated: 50 mL/min — ABNORMAL LOW (ref >60)
Glucose, Bld: 139 mg/dL — ABNORMAL HIGH (ref 70–99)
Potassium: 3.5 mmol/L (ref 3.5–5.1)
Sodium: 137 mmol/L (ref 135–145)

## 2022-11-18 LAB — LIPID PANEL
Cholesterol: 139 mg/dL (ref 0–200)
HDL: 66 mg/dL (ref >40)
LDL Cholesterol: 55 mg/dL (ref 0–99)
Total CHOL/HDL Ratio: 2.1 RATIO
Triglycerides: 91 mg/dL (ref <150)
VLDL: 18 mg/dL (ref 0–40)

## 2022-11-18 LAB — ECHOCARDIOGRAM COMPLETE
Area-P 1/2: 1.89 cm2
Height: 66 in
S' Lateral: 3.3 cm
Single Plane A4C EF: 66.4 %
Weight: 3266.34 oz

## 2022-11-18 LAB — GLUCOSE, CAPILLARY
Glucose-Capillary: 133 mg/dL — ABNORMAL HIGH (ref 70–99)
Glucose-Capillary: 176 mg/dL — ABNORMAL HIGH (ref 70–99)
Glucose-Capillary: 193 mg/dL — ABNORMAL HIGH (ref 70–99)
Glucose-Capillary: 238 mg/dL — ABNORMAL HIGH (ref 70–99)

## 2022-11-18 LAB — MAGNESIUM: Magnesium: 1.6 mg/dL — ABNORMAL LOW (ref 1.7–2.4)

## 2022-11-18 LAB — MRSA NEXT GEN BY PCR, NASAL: MRSA by PCR Next Gen: NOT DETECTED

## 2022-11-18 LAB — HEMOGLOBIN A1C
Hgb A1c MFr Bld: 6.5 % — ABNORMAL HIGH (ref 4.8–5.6)
Mean Plasma Glucose: 139.85 mg/dL

## 2022-11-18 MED ORDER — POTASSIUM CHLORIDE 20 MEQ PO PACK
40.0000 meq | PACK | Freq: Once | ORAL | Status: AC
  Administered 2022-11-18: 40 meq via ORAL
  Filled 2022-11-18: qty 2

## 2022-11-18 MED ORDER — LABETALOL HCL 5 MG/ML IV SOLN: 10.0000 mg | INTRAVENOUS | Status: DC | PRN

## 2022-11-18 MED ORDER — PREDNISONE 10 MG PO TABS: 10.0000 mg | ORAL_TABLET | Freq: Every day | ORAL | Status: DC

## 2022-11-18 MED ORDER — ATORVASTATIN CALCIUM 10 MG PO TABS: 10.0000 mg | ORAL_TABLET | ORAL | Status: DC

## 2022-11-18 MED ORDER — HYDRALAZINE HCL 20 MG/ML IJ SOLN: 20.0000 mg | INTRAMUSCULAR | Status: DC | PRN

## 2022-11-18 MED ORDER — HYDRALAZINE HCL 20 MG/ML IJ SOLN: 5.0000 mg | INTRAMUSCULAR | Status: DC | PRN

## 2022-11-18 MED ORDER — PHENYLEPHRINE 80 MCG/ML (10ML) SYRINGE FOR IV PUSH (FOR BLOOD PRESSURE SUPPORT)
PREFILLED_SYRINGE | INTRAVENOUS | Status: AC
  Filled 2022-11-18: qty 10

## 2022-11-18 MED ORDER — MAGNESIUM SULFATE 2 GM/50ML IV SOLN
2.0000 g | Freq: Once | INTRAVENOUS | Status: AC
  Administered 2022-11-18: 2 g via INTRAVENOUS
  Filled 2022-11-18: qty 50

## 2022-11-18 MED ORDER — PREDNISONE 10 MG PO TABS
10.0000 mg | ORAL_TABLET | Freq: Three times a day (TID) | ORAL | Status: DC
  Administered 2022-11-18 – 2022-11-19 (×5): 10 mg via ORAL
  Filled 2022-11-18 (×6): qty 1

## 2022-11-18 MED ORDER — INSULIN ASPART 100 UNIT/ML IJ SOLN
0.0000 [IU] | Freq: Three times a day (TID) | INTRAMUSCULAR | Status: DC
  Administered 2022-11-18 (×2): 1 [IU] via SUBCUTANEOUS

## 2022-11-18 MED ORDER — POTASSIUM CHLORIDE CRYS ER 20 MEQ PO TBCR
40.0000 meq | EXTENDED_RELEASE_TABLET | Freq: Once | ORAL | Status: AC
  Administered 2022-11-18: 40 meq via ORAL
  Filled 2022-11-18: qty 2

## 2022-11-18 MED ORDER — PREDNISONE 10 MG PO TABS: 10.0000 mg | ORAL_TABLET | Freq: Two times a day (BID) | ORAL | Status: DC

## 2022-11-18 MED ORDER — STROKE: EARLY STAGES OF RECOVERY BOOK
Status: AC
  Filled 2022-11-18: qty 1

## 2022-11-18 MED ORDER — LABETALOL HCL 5 MG/ML IV SOLN: 20.0000 mg | INTRAVENOUS | Status: DC | PRN

## 2022-11-18 NOTE — Evaluation (Signed)
Physical Therapy Evaluation Patient Details Name: Ebony [name] MRN: [id number] DOB: [birth year] Today's Date: 11/18/2022  History of Present Illness  82 yo right-handed woman presented 11/17/22 with aphasia. Given TNK. PMH: HTN, basal cell cancer, TIA, osteopenia, heart murmur, lumbar decompressive sx 09/12/21.  Clinical Impression   Pt admitted secondary to problem above with deficits below. PTA patient was living with grandson and friend in one level townhouse with no steps to enter. She furniture walks inside the home and "sometimes" takes a cane with her in the community (she prefers not to use it). Pt currently requires minguard assist for ambulation without a device with antalgic pattern due to reported left hip and low back pain. Would like to assess pt with a straight cane vs rollator (pt would prefer cane) for safety with ambulation.  Anticipate patient will benefit from PT to address problems listed below.Will continue to follow acutely to maximize functional mobility independence and safety.          Recommendations for follow up therapy are one component of a multi-disciplinary discharge planning process, led by the attending physician.  Recommendations may be updated based on patient status, additional functional criteria and insurance authorization.  Follow Up Recommendations       Assistance Recommended at Discharge None  Patient can return home with the following       Equipment Recommendations None recommended by PT  Recommendations for Other Services       Functional Status Assessment Patient has had a recent decline in their functional status and demonstrates the ability to make significant improvements in function in a reasonable and predictable amount of time.     Precautions / Restrictions Precautions Precautions: Fall      Mobility  Bed Mobility Overal bed mobility: Needs Assistance Bed Mobility: Supine to Sit     Supine to sit: Min assist, HOB elevated      General bed mobility comments: assist to raise torso and balance while scooting out to EOB    Transfers Overall transfer level: Needs assistance Equipment used: 1 person hand held assist Transfers: Sit to/from Stand Sit to Stand: Min guard           General transfer comment: no imbalance or dizziness;    Ambulation/Gait Ambulation/Gait assistance: Min guard Gait Distance (Feet): 150 Feet Assistive device: 1 person hand held assist, None Gait Pattern/deviations: Step-through pattern, Antalgic, Decreased stride length   Gait velocity interpretation: 1.31 - 2.62 ft/sec, indicative of limited community ambulator   General Gait Details: pt reports limps due to Left hip and low back pain  Stairs            Wheelchair Mobility    Modified Rankin (Stroke Patients Only) Modified Rankin (Stroke Patients Only) Pre-Morbid Rankin Score: No significant disability Modified Rankin: No significant disability     Balance Overall balance assessment: Mild deficits observed, not formally tested                                           Pertinent Vitals/Pain Pain Assessment Pain Assessment: No/denies pain    Home Living Family/patient expects to be discharged to:: Private residence Living Arrangements: Other relatives;Non-relatives/Friends (grandson; friend) Available Help at Discharge: Family;Available PRN/intermittently Type of Home: House (townhouse) Home Access: Level entry       Home Layout: One level Home Equipment: Rollator (4 wheels);Cane - single point;Shower seat  Prior Function Prior Level of Function : Independent/Modified Independent;Driving             Mobility Comments: no device indoors; cane in community "sometimes"; reports has fallen 3x, but none in the past 6 months       Hand Dominance   Dominant Hand: Right    Extremity/Trunk Assessment   Upper Extremity Assessment Upper Extremity Assessment: Defer to OT  evaluation    Lower Extremity Assessment Lower Extremity Assessment: Generalized weakness    Cervical / Trunk Assessment Cervical / Trunk Assessment: Normal  Communication   Communication: HOH  Cognition Arousal/Alertness: Awake/alert Behavior During Therapy: WFL for tasks assessed/performed Overall Cognitive Status: Within Functional Limits for tasks assessed                                          General Comments General comments (skin integrity, edema, etc.): Grandson present    Exercises     Assessment/Plan    PT Assessment Patient needs continued PT services  PT Problem List Decreased strength;Decreased balance;Decreased mobility;Pain       PT Treatment Interventions DME instruction;Gait training;Functional mobility training;Therapeutic activities;Therapeutic exercise;Balance training;Patient/family education    PT Goals (Current goals can be found in the Care Plan section)  Acute Rehab PT Goals Patient Stated Goal: be able to walk without device PT Goal Formulation: With patient Time For Goal Achievement: 12/02/22 Potential to Achieve Goals: Good    Frequency Min 3X/week     Co-evaluation               AM-PAC PT "6 Clicks" Mobility  Outcome Measure Help needed turning from your back to your side while in a flat bed without using bedrails?: A Little Help needed moving from lying on your back to sitting on the side of a flat bed without using bedrails?: A Little Help needed moving to and from a bed to a chair (including a wheelchair)?: A Little Help needed standing up from a chair using your arms (e.g., wheelchair or bedside chair)?: A Little Help needed to walk in hospital room?: A Little Help needed climbing 3-5 steps with a railing? : A Little 6 Click Score: 18    End of Session Equipment Utilized During Treatment: Gait belt Activity Tolerance: Patient tolerated treatment well Patient left: in chair;with call bell/phone within  reach;with chair alarm set;with family/visitor present Nurse Communication: Mobility status PT Visit Diagnosis: Difficulty in walking, not elsewhere classified (R26.2)    Time: 1610-9604 PT Time Calculation (min) (ACUTE ONLY): 14 min   Charges:   PT Evaluation $PT Eval Low Complexity: 1 Low           Jerolyn Center, PT Acute Rehabilitation Services  Office [phone]   Zena Amos 11/18/2022, 3:57 PM

## 2022-11-18 NOTE — Progress Notes (Signed)
  Transition of Care [facility]) Screening Note   Patient Details  Name: Ebony [name] Date of Birth: [birth year]   Transition of Care [facility]) CM/SW Contact:    Glennon Mac, RN Phone Number: 11/18/2022, 5:13 PM    Transition of Care Department [facility]) has reviewed patient and no TOC needs have been identified at this time. We will continue to monitor patient advancement through interdisciplinary progression rounds. If new patient transition needs arise, please place a TOC consult.  Quintella Baton, RN, BSN  Trauma/Neuro ICU Case Manager [phone]

## 2022-11-18 NOTE — Progress Notes (Addendum)
STROKE TEAM PROGRESS NOTE   INTERVAL HISTORY No family at the bedside. Was eating yesterday and became confused. Does remember EMS coming but does not remember events or remember what was said. Was speaking but it didn't make sense and then knew what she wanted to say but couldn't get it out.  Loop negative for afib MRI scheduled for tonight  Vitals:   11/18/22 0530 11/18/22 0600 11/18/22 0700 11/18/22 0800  BP: (!) 147/76 (!) 149/73 (!) 146/72 (!) 148/70  Pulse: (!) 58 60 60 (!) 56  Resp: 19 20 (!) 25 (!) 25  Temp:    97.9 F (36.6 C)  TempSrc:    Oral  SpO2: 95% 93% 95% 90%  Weight:      Height:       CBC:  Recent Labs  Lab 11/17/22 2147 11/17/22 2149  WBC 6.0  --   NEUTROABS 3.8  --   HGB 14.4 14.3  HCT 40.7 42.0  MCV 98.3  --   PLT 265  --    Basic Metabolic Panel:  Recent Labs  Lab 11/17/22 2147 11/17/22 2149  NA 137 139  K 2.8* 2.8*  CL 99 102  CO2 21*  --   GLUCOSE 203* 201*  BUN 21 22  CREATININE 1.13* 1.00  CALCIUM 9.6  --    Lipid Panel:  Recent Labs  Lab 11/17/22 2344  CHOL 139  TRIG 91  HDL 66  CHOLHDL 2.1  VLDL 18  LDLCALC 55   HgbA1c:  Recent Labs  Lab 11/17/22 2344  HGBA1C 6.5*   Urine Drug Screen: No results for input(s): "LABOPIA", "COCAINSCRNUR", "LABBENZ", "AMPHETMU", "THCU", "LABBARB" in the last 168 hours.  Alcohol Level  Recent Labs  Lab 11/17/22 2147  ETH <10    IMAGING past 24 hours CT ANGIO HEAD NECK W WO CM (CODE STROKE)  Result Date: 11/17/2022 CLINICAL DATA:  Acute neurologic deficits EXAM: CT ANGIOGRAPHY HEAD AND NECK TECHNIQUE: Multidetector CT imaging of the head and neck was performed using the standard protocol during bolus administration of intravenous contrast. Multiplanar CT image reconstructions and MIPs were obtained to evaluate the vascular anatomy. Carotid stenosis measurements (when applicable) are obtained utilizing NASCET criteria, using the distal internal carotid diameter as the denominator.  RADIATION DOSE REDUCTION: This exam was performed according to the departmental dose-optimization program which includes automated exposure control, adjustment of the mA and/or kV according to patient size and/or use of iterative reconstruction technique. CONTRAST:  75mL OMNIPAQUE IOHEXOL 350 MG/ML SOLN COMPARISON:  None Available. FINDINGS: CTA NECK FINDINGS SKELETON: There is no bony spinal canal stenosis. No lytic or blastic lesion. OTHER NECK: Normal pharynx, larynx and major salivary glands. No cervical lymphadenopathy. Unremarkable thyroid gland. UPPER CHEST: Biapical septal thickening. AORTIC ARCH: There is calcific atherosclerosis of the aortic arch. There is no aneurysm, dissection or hemodynamically significant stenosis of the visualized portion of the aorta. Conventional 3 vessel aortic branching pattern. The visualized proximal subclavian arteries are widely patent. RIGHT CAROTID SYSTEM: Normal without aneurysm, dissection or stenosis. LEFT CAROTID SYSTEM: No dissection, occlusion or aneurysm. Mild atherosclerotic calcification at the carotid bifurcation without hemodynamically significant stenosis. VERTEBRAL ARTERIES: Left dominant configuration. Both origins are clearly patent. There is no dissection, occlusion or flow-limiting stenosis to the skull base (V1-V3 segments). CTA HEAD FINDINGS POSTERIOR CIRCULATION: --Vertebral arteries: Normal V4 segments. --Inferior cerebellar arteries: Normal. --Basilar artery: Normal. --Superior cerebellar arteries: Normal. --Posterior cerebral arteries (PCA): Normal. ANTERIOR CIRCULATION: --Intracranial internal carotid arteries: Atherosclerotic calcification of the internal  carotid arteries at the skull base without hemodynamically significant stenosis. --Anterior cerebral arteries (ACA): Normal. Both A1 segments are present. Patent anterior communicating artery (a-comm). --Middle cerebral arteries (MCA): Normal. VENOUS SINUSES: As permitted by contrast timing,  patent. ANATOMIC VARIANTS: None Review of the MIP images confirms the above findings. IMPRESSION: 1. No emergent large vessel occlusion or hemodynamically significant stenosis of the head or neck. 2. Biapical septal thickening, which may indicate pulmonary edema. Aortic atherosclerosis (ICD10-I70.0). Electronically Signed   By: Deatra Robinson M.D.   On: 11/17/2022 22:32   CT HEAD CODE STROKE WO CONTRAST  Result Date: 11/17/2022 CLINICAL DATA:  Code stroke.  Aphasia EXAM: CT HEAD WITHOUT CONTRAST TECHNIQUE: Contiguous axial images were obtained from the base of the skull through the vertex without intravenous contrast. RADIATION DOSE REDUCTION: This exam was performed according to the departmental dose-optimization program which includes automated exposure control, adjustment of the mA and/or kV according to patient size and/or use of iterative reconstruction technique. COMPARISON:  None Available. FINDINGS: Brain: There is no mass, hemorrhage or extra-axial collection. The size and configuration of the ventricles and extra-axial CSF spaces are normal. There is hypoattenuation of the periventricular white matter, most commonly indicating chronic ischemic microangiopathy. Old left cerebellar infarct. Vascular: No abnormal hyperdensity of the major intracranial arteries or dural venous sinuses. No intracranial atherosclerosis. Skull: The visualized skull base, calvarium and extracranial soft tissues are normal. Sinuses/Orbits: No fluid levels or advanced mucosal thickening of the visualized paranasal sinuses. No mastoid or middle ear effusion. The orbits are normal. ASPECTS [facility] Stroke Program Early CT Score) - Ganglionic level infarction (caudate, lentiform nuclei, internal capsule, insula, M1-M3 cortex): 7 - Supraganglionic infarction (M4-M6 cortex): 3 Total score (0-10 with 10 being normal): 10 IMPRESSION: 1. No acute intracranial abnormality. 2. ASPECTS is 10. These results were communicated to Dr. Brooke Dare at 9:57 pm on 11/17/2022 by text page via the [facility] messaging system. Electronically Signed   By: Deatra Robinson M.D.   On: 11/17/2022 21:58    PHYSICAL EXAM Constitutional: Appears well-developed and well-nourished.  Cardiovascular: Normal rate and regular rhythm. Perfusing extremities well Respiratory: Effort normal, non-labored breathing  Neuro: Mental Status: Patient is awake, alert, oriented to person, place, states date, age. Does not remember all of dinner yesterday. Comprehension, repetition, reading intact Cranial Nerves: II: Visual Fields are full.  III,IV, VI: EOMI without ptosis or diploplia.  V: Facial sensation is symmetric to light eyelash brush VII: Facial movement is symmetric.  VIII: hearing is intact to voice X: Uvula elevates symmetrically XI: Shoulder shrug is symmetric. XII: tongue is midline without atrophy or fasciculations.  Motor: Tone is normal. Bulk is normal.  No pronator drift.   Back pain with leg elevation.  Sensory: Sensation is symmetric to light touch throughout Cerebellar: FNF and HKS are intact bilaterally Gait:  Deferred in acute setting   ASSESSMENT/PLAN Ebony [name] is a 82 y.o. 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 ASPECTS 10.    CTA head & neck No LVO MRI  Pending  2D Echo EF 60-65% Loop recorder interrogation no A-fib LDL 55 HgbA1c 6.5 VTE prophylaxis - SCDs clopidogrel 75 mg daily prior to admission, now on No antithrombotic until 24 hours post TNK.   Therapy  recommendations: None Disposition:  Pending   History of stroke 09/2021 admitted for left arm weakness, alien hand.  CT no acute abnormality.  CTA head and neck  unremarkable.  MRI showed right parietal small linear infarct.  EF 60 to 65%, negative for DVT.  LDL 35, A1c 5.9.  Loop recorder placed.  Discharged on DAPT and Repatha  Hypertension Home meds:  None BP goal less than 180/105 after TNK Long-term BP goal normotensive  Hyperlipidemia Home meds:  Atorvastatin 10mg  3 times per week, resumed in hospital LDL 55, goal < 70 Continue statin at discharge No high intensity statin as patient LDL at goal, and did not tolerate repatha or higher dose/frequency of statin  Other Stroke Risk Factors Advanced Age >/= 37  Obesity, Body mass index is 32.95 kg/m., BMI >/= 30 associated with increased stroke risk, recommend weight loss, diet and exercise as appropriate   Other Active Problems Hypokalemia, K 2.8 supplement Chronic back pain  S/p L4/L5 spinal stenosis s/p laminectomy Recent cortisone injection back and on prednisone taper pack  Hospital day # 1  Patient seen and examined by NP/APP with MD. MD to update note as needed.   Elmer Picker, DNP, FNP-BC [facility] Pager: [phone]  ATTENDING NOTE: I reviewed above note and agree with the assessment and plan. Pt was seen and examined.   No family bedside.  Patient lying in bed, stated that he is back to normal baseline.  On exam, no focal deficit, although mild dysarthria but patient said that was her normal speech.  Status post TNK, MRI pending.  Patient does have small stroke 1 year ago, put on loop recorder.  Loop recorder interrogation this admission no A-fib.  May consider clinical trials after 24 hours of TNK.  Continue home statin regimen, not able to tolerate bleeding higher dose of Lipitor or Repatha.  PT and OT no recommendation.  For detailed assessment and plan, please refer to above/below as I have made changes wherever appropriate.   Marvel Plan, MD PhD Stroke Neurology 11/18/2022 6:39 PM  This patient is critically ill due to stroke status post TNK and at  significant risk of neurological worsening, death form recurrent stroke, hemorrhagic transmission, bleeding from TNK. This patient's care requires constant monitoring of vital signs, hemodynamics, respiratory and cardiac monitoring, review of multiple databases, neurological assessment, discussion with family, other specialists and medical decision making of high complexity. I spent 35 minutes of neurocritical care time in the care of this patient.    To contact Stroke Continuity provider, please refer to [email or web address]. After hours, contact General Neurology

## 2022-11-18 NOTE — Progress Notes (Signed)
  Echocardiogram 2D Echocardiogram has been performed.  Milda Smart 11/18/2022, 11:52 AM

## 2022-11-18 NOTE — Evaluation (Signed)
Speech Language Pathology Evaluation Patient Details Name: SIGNE TACKITT MRN: [id number] DOB: 12-31-1940 Today's Date: 11/18/2022 Time:  -     Problem List:  Patient Active Problem List   Diagnosis Date Noted   Acute left ICA ischemic stroke (HCC) 11/17/2022   Encounter for counseling 12/31/2021   CVA (cerebral vascular accident) (HCC) 10/08/2021   Spinal stenosis at L4-L5 level 09/12/2021   Hyperlipidemia 04/05/2019   Tachycardia 02/04/2016   Dizziness 02/04/2016   Primary osteoarthritis of right knee 11/29/2015   Chronic kidney disease, stage 3a (HCC) 01/18/2015   Essential hypertension 11/22/2014   Gastro-esophageal reflux disease without esophagitis 11/22/2014   Acute medial meniscal tear 10/04/2013   Past Medical History:  Past Medical History:  Diagnosis Date   Arthritis    Cancer (HCC)    BASAL CELL CA   Depression    GERD (gastroesophageal reflux disease)    TAKES TUMS PRN   Hypertension    Osteopenia    Stroke (HCC)    TIA 2004- PT IS ON PLAVIX   Past Surgical History:  Past Surgical History:  Procedure Laterality Date   BUNIONECTOMY Right    CARDIAC CATHETERIZATION     CATARACT EXTRACTION Bilateral    KNEE ARTHROPLASTY Right 11/29/2015   Procedure: RIGHT TOTAL KNEE ARTHROPLASTY ;  Surgeon: Samson Frederic, MD;  Location: [facility];  Service: Orthopedics;  Laterality: Right;   KNEE ARTHROSCOPY Right 10/05/2013   Procedure: RIGHT KNEE ARTHROSCOPY WITH medial and lateral DEBRIDEMENT chrondroplasty;  Surgeon: Loanne Drilling, MD;  Location: [facility];  Service: Orthopedics;  Laterality: Right;   LOOP RECORDER INSERTION N/A 10/09/2021   Procedure: LOOP RECORDER INSERTION;  Surgeon: Marinus Maw, MD;  Location: [facility] INVASIVE CV LAB;  Service: Cardiovascular;  Laterality: N/A;   LUMBAR LAMINECTOMY/DECOMPRESSION MICRODISCECTOMY N/A 09/12/2021   Procedure: Lumbar decompression Lumbar three - Lumbar four and lumbar four-five;  Surgeon: Jene Every, MD;  Location: [facility] OR;   Service: Orthopedics;  Laterality: N/A;   MOHS SURGERY ON NOSE 01/2007, MOHS ON FACE 08/2012     TONSILLECTOMY     HPI:  CANDIS KABEL is a 82 y.o. right-handed woman with acute onset of aphasia. Given TNK.   Assessment / Plan / Recommendation Clinical Impression  Pt with no residual aphasia. Administered the Western Aphasia Battery -Bedside assessment. Pt scored WNL, no errors. Pt also able to complete basic functional task of brushing teeth while in bed. SLP will sign off.    SLP Assessment  SLP Recommendation/Assessment: Patient does not need any further Speech Lanaguage Pathology Services    Recommendations for follow up therapy are one component of a multi-disciplinary discharge planning process, led by the attending physician.  Recommendations may be updated based on patient status, additional functional criteria and insurance authorization.    Follow Up Recommendations  No SLP follow up    Assistance Recommended at Discharge     Functional Status Assessment    Frequency and Duration           SLP Evaluation Cognition  Overall Cognitive Status: Within Functional Limits for tasks assessed       Comprehension  Auditory Comprehension Overall Auditory Comprehension: Appears within functional limits for tasks assessed    Expression Verbal Expression Overall Verbal Expression: Appears within functional limits for tasks assessed   Oral / Motor  Oral Motor/Sensory Function Overall Oral Motor/Sensory Function: Within functional limits Motor Speech Overall Motor Speech: Appears within functional limits for tasks assessed  Vyncent Overby, Riley Nearing 11/18/2022, 11:58 AM

## 2022-11-19 ENCOUNTER — Other Ambulatory Visit: Payer: Self-pay | Admitting: Neurology

## 2022-11-19 DIAGNOSIS — I639 Cerebral infarction, unspecified: Secondary | ICD-10-CM

## 2022-11-19 DIAGNOSIS — I63232 Cerebral infarction due to unspecified occlusion or stenosis of left carotid arteries: Secondary | ICD-10-CM | POA: Diagnosis not present

## 2022-11-19 LAB — GLUCOSE, CAPILLARY
Glucose-Capillary: 145 mg/dL — ABNORMAL HIGH (ref 70–99)
Glucose-Capillary: 129 mg/dL — ABNORMAL HIGH (ref 70–99)

## 2022-11-19 MED ORDER — STUDY - LIBREXIA-STROKE - MILVEXIAN 25 MG OR PLACEBO TABLET (PI-SETHI)
1.0000 | ORAL_TABLET | Freq: Two times a day (BID) | ORAL | Status: DC
  Administered 2022-11-19: 1 via ORAL
  Filled 2022-11-19: qty 1

## 2022-11-19 MED ORDER — ASPIRIN 81 MG PO CHEW
81.0000 mg | CHEWABLE_TABLET | Freq: Every day | ORAL | Status: DC
  Administered 2022-11-19: 81 mg via ORAL
  Filled 2022-11-19: qty 1

## 2022-11-19 MED ORDER — STUDY - LIBREXIA-STROKE - MILVEXIAN 25 MG OR PLACEBO TABLET (PI-SETHI): 1.0000 | ORAL_TABLET | Freq: Two times a day (BID) | ORAL | 0 refills | Status: DC

## 2022-11-19 MED ORDER — CLOPIDOGREL BISULFATE 75 MG PO TABS
75.0000 mg | ORAL_TABLET | Freq: Every day | ORAL | Status: DC
  Administered 2022-11-19: 75 mg via ORAL
  Filled 2022-11-19: qty 1

## 2022-11-19 MED ORDER — PREDNISONE 10 MG PO TABS: 10.0000 mg | ORAL_TABLET | Freq: Three times a day (TID) | ORAL | 0 refills | Status: DC

## 2022-11-19 MED ORDER — ASPIRIN 81 MG PO CHEW: 81.0000 mg | CHEWABLE_TABLET | Freq: Every day | ORAL | 0 refills | Status: DC

## 2022-11-19 MED ORDER — PREDNISONE 10 MG PO TABS: 10.0000 mg | ORAL_TABLET | Freq: Every day | ORAL | 0 refills | Status: DC

## 2022-11-19 MED ORDER — PREDNISONE 10 MG PO TABS: 10.0000 mg | ORAL_TABLET | Freq: Two times a day (BID) | ORAL | 0 refills | Status: DC

## 2022-11-19 NOTE — Discharge Summary (Addendum)
Stroke Discharge Summary  Patient ID: Ebony [name]   MRN: [id number]      DOB: [birth year]  Date of Admission: 11/17/2022 Date of Discharge: 11/19/2022  Attending Physician:  Stroke, Md, MD, Stroke MD Consultant(s):    None  Patient's PCP:  [name], MD  DISCHARGE DIAGNOSIS:  Possible left brain stroke aborted by TNK  Active problem: History of stroke Hypertension Hyperlipidemia Obesity Anemia Chronic pain   Allergies as of 11/19/2022       Reactions   Other Anaphylaxis   Unknown muscle relaxer   Codeine Nausea And Vomiting   Repatha [evolocumab] Itching   Statins Other (See Comments)   Myalgias  Pt ok with taking atorvastatin 10mg  M-W-F   Sulfa Antibiotics Other (See Comments)   Red streaks on nose- ?  Cellulitis         Medication List     STOP taking these medications    terbinafine 250 MG tablet Commonly known as: LamISIL       TAKE these medications    acetaminophen 500 MG tablet Commonly known as: TYLENOL Take 1,000 mg by mouth every 6 (six) hours as needed for headache (pain).   allopurinol 100 MG tablet Commonly known as: ZYLOPRIM Take 100 mg by mouth every morning.   ALPRAZolam 0.5 MG tablet Commonly known as: XANAX Take 0.5 mg by mouth at bedtime as needed for anxiety or sleep.   aspirin 81 MG chewable tablet Chew 1 tablet (81 mg total) by mouth daily.   atorvastatin 10 MG tablet Commonly known as: LIPITOR Take 10 mg by mouth See admin instructions. 10 mg once daily in the evening on Monday, Wednesday, Friday.   citalopram 20 MG tablet Commonly known as: CELEXA Take 20 mg by mouth every morning.   clopidogrel 75 MG tablet Commonly known as: PLAVIX Take 75 mg by mouth every morning.   LIBREXIA-STROKE milvexian or placebo 25 mg tablet Take 1 tablet by mouth 2 (two) times daily. For Investigational Use Only. Take with or without food at approximately the same time each day. Please contact [facility] for any  questions or concerns regarding this medication.   triamterene-hydrochlorothiazide 37.5-25 MG capsule Commonly known as: DYAZIDE Take 1 capsule by mouth every morning.   Vitamin D (Ergocalciferol) 1.25 MG (50000 UNIT) Caps capsule Commonly known as: DRISDOL Take 50,000 Units by mouth every Monday.        LABORATORY STUDIES CBC    Component Value Date/Time   WBC 6.0 11/17/2022 2147   RBC 4.14 11/17/2022 2147   HGB 14.3 11/17/2022 2149   HCT 42.0 11/17/2022 2149   PLT 265 11/17/2022 2147   MCV 98.3 11/17/2022 2147   MCH 34.8 (H) 11/17/2022 2147   MCHC 35.4 11/17/2022 2147   RDW 12.5 11/17/2022 2147   LYMPHSABS 1.3 11/17/2022 2147   MONOABS 0.7 11/17/2022 2147   EOSABS 0.1 11/17/2022 2147   BASOSABS 0.0 11/17/2022 2147   CMP    Component Value Date/Time   NA 137 11/18/2022 0732   K 3.5 11/18/2022 0732   CL 103 11/18/2022 0732   CO2 25 11/18/2022 0732   GLUCOSE 139 (H) 11/18/2022 0732   BUN 20 11/18/2022 0732   CREATININE 1.10 (H) 11/18/2022 0732   CREATININE 1.03 (H) 02/11/2022 1443   CALCIUM 8.9 11/18/2022 0732   PROT 6.4 (L) 11/17/2022 2147   ALBUMIN 3.5 11/17/2022 2147   AST 24 11/17/2022 2147   ALT 18 11/17/2022 2147  ALKPHOS 51 11/17/2022 2147   BILITOT 0.4 11/17/2022 2147   GFRNONAA 50 (L) 11/18/2022 0732   GFRAA >60 11/30/2015 0402   COAGS Lab Results  Component Value Date   INR 1.1 11/17/2022   INR 0.9 10/08/2021   INR 0.9 01/29/2021   Lipid Panel    Component Value Date/Time   CHOL 139 11/17/2022 2344   TRIG 91 11/17/2022 2344   HDL 66 11/17/2022 2344   CHOLHDL 2.1 11/17/2022 2344   VLDL 18 11/17/2022 2344   LDLCALC 55 11/17/2022 2344   HgbA1C  Lab Results  Component Value Date   HGBA1C 6.5 (H) 11/17/2022   Urinalysis    Component Value Date/Time   COLORURINE YELLOW 01/29/2021 0104   APPEARANCEUR CLEAR 01/29/2021 0104   LABSPEC 1.018 01/29/2021 0104   PHURINE 5.0 01/29/2021 0104   GLUCOSEU NEGATIVE 01/29/2021 0104   HGBUR  NEGATIVE 01/29/2021 0104   BILIRUBINUR NEGATIVE 01/29/2021 0104   KETONESUR NEGATIVE 01/29/2021 0104   PROTEINUR NEGATIVE 01/29/2021 0104   NITRITE NEGATIVE 01/29/2021 0104   LEUKOCYTESUR SMALL (A) 01/29/2021 0104   Urine Drug Screen No results found for: "LABOPIA", "COCAINSCRNUR", "LABBENZ", "AMPHETMU", "THCU", "LABBARB"  Alcohol Level    Component Value Date/Time   ETH <10 11/17/2022 2147     SIGNIFICANT DIAGNOSTIC STUDIES MR BRAIN WO CONTRAST  Result Date: 11/18/2022 CLINICAL DATA:  Stroke follow-up EXAM: MRI HEAD WITHOUT CONTRAST TECHNIQUE: Multiplanar, multiecho pulse sequences of the brain and surrounding structures were obtained without intravenous contrast. COMPARISON:  10/09/2021 FINDINGS: Brain: No acute infarct, mass effect or extra-axial collection. No acute or chronic hemorrhage. There is multifocal hyperintense T2-weighted signal within the white matter. Generalized volume loss. Old right parietal infarct. Old left cerebellar infarct. The midline structures are normal. Vascular: Major flow voids are preserved. Skull and upper cervical spine: Normal calvarium and skull base. Visualized upper cervical spine and soft tissues are normal. Sinuses/Orbits:No paranasal sinus fluid levels or advanced mucosal thickening. No mastoid or middle ear effusion. Normal orbits. IMPRESSION: 1. No acute intracranial abnormality. 2. 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 Electronically Signed   By: Deatra Robinson M.D.   On: 11/18/2022 22:29   ECHOCARDIOGRAM COMPLETE  Result Date: 11/18/2022    ECHOCARDIOGRAM REPORT   Patient Name:   Ebony [name] Date of Exam: 11/18/2022 Medical Rec #:  [id number]      Height:       66.0 in Accession #:    [id number]     Weight:       204.1 lb Date of Birth:  1941/07/03       BSA:          2.017 m Patient Age:    82 years       BP:           145/68 mmHg Patient Gender: F              HR:           55 bpm. Exam Location:  Inpatient Procedure: 2D Echo, Cardiac  Doppler and Color Doppler Indications:    Stroke  History:        Patient has prior history of Echocardiogram examinations, most                 recent 10/09/2021. Stroke; Risk Factors:Hypertension. Cancer.  Sonographer:    Milda Smart Referring Phys: Gordy Councilman  Sonographer Comments: Technically difficult study due to poor echo windows. Image acquisition challenging due to patient body  habitus and Image acquisition challenging due to respiratory motion. IMPRESSIONS  1. Left ventricular ejection fraction, by estimation, is 60 to 65%. The left ventricle has normal function. The left ventricle has no regional wall motion abnormalities. Left ventricular diastolic parameters are consistent with Grade I diastolic dysfunction (impaired relaxation).  2. Right ventricular systolic function is normal. The right ventricular size is normal.  3. The mitral valve is normal in structure. No evidence of mitral valve regurgitation. No evidence of mitral stenosis.  4. The aortic valve is tricuspid. There is mild calcification of the aortic valve. Aortic valve regurgitation is trivial. Comparison(s): No significant change from prior study. Conclusion(s)/Recommendation(s): No intracardiac source of embolism detected on this transthoracic study. Consider a transesophageal echocardiogram to exclude cardiac source of embolism if clinically indicated. FINDINGS  Left Ventricle: Left ventricular ejection fraction, by estimation, is 60 to 65%. The left ventricle has normal function. The left ventricle has no regional wall motion abnormalities. The left ventricular internal cavity size was normal in size. There is  no left ventricular hypertrophy. Left ventricular diastolic parameters are consistent with Grade I diastolic dysfunction (impaired relaxation). Right Ventricle: The right ventricular size is normal. No increase in right ventricular wall thickness. Right ventricular systolic function is normal. Left Atrium: Left atrial size  was normal in size. Right Atrium: Right atrial size was normal in size. Pericardium: Trivial pericardial effusion is present. Presence of epicardial fat layer. Mitral Valve: The mitral valve is normal in structure. Mild to moderate mitral annular calcification. No evidence of mitral valve regurgitation. No evidence of mitral valve stenosis. Tricuspid Valve: The tricuspid valve is normal in structure. Tricuspid valve regurgitation is mild . No evidence of tricuspid stenosis. Aortic Valve: The aortic valve is tricuspid. There is mild calcification of the aortic valve. There is mild aortic valve annular calcification. Aortic valve regurgitation is trivial. Pulmonic Valve: The pulmonic valve was normal in structure. Pulmonic valve regurgitation is not visualized. No evidence of pulmonic stenosis. Aorta: The aortic root and ascending aorta are structurally normal, with no evidence of dilitation. IAS/Shunts: The atrial septum is grossly normal.  LEFT VENTRICLE PLAX 2D LVIDd:         5.20 cm     Diastology LVIDs:         3.30 cm     LV e' medial:    5.11 cm/s LV PW:         0.80 cm     LV E/e' medial:  10.3 LV IVS:        0.70 cm     LV e' lateral:   6.85 cm/s LVOT diam:     2.00 cm     LV E/e' lateral: 7.7 LVOT Area:     3.14 cm  LV Volumes (MOD) LV vol d, MOD A4C: 65.5 ml LV vol s, MOD A4C: 22.0 ml LV SV MOD A4C:     65.5 ml RIGHT VENTRICLE RV S prime:     13.50 cm/s TAPSE (M-mode): 1.6 cm LEFT ATRIUM             Index        RIGHT ATRIUM           Index LA diam:        3.90 cm 1.93 cm/m   RA Area:     17.00 cm LA Vol (A2C):   27.8 ml 13.78 ml/m  RA Volume:   46.60 ml  23.10 ml/m LA Vol (A4C):   24.9 ml 12.34  ml/m LA Biplane Vol: 27.3 ml 13.53 ml/m   AORTA Ao Root diam: 3.00 cm Ao Asc diam:  3.70 cm MITRAL VALVE               TRICUSPID VALVE MV Area (PHT): 1.89 cm    TR Peak grad:   14.6 mmHg MV Decel Time: 401 msec    TR Mean grad:   11.0 mmHg MV E velocity: 52.50 cm/s  TR Vmax:        191.00 cm/s MV A velocity:  65.10 cm/s  TR Vmean:       162.0 cm/s MV E/A ratio:  0.81                            SHUNTS                            Systemic Diam: 2.00 cm Riley Lam MD Electronically signed by Riley Lam MD Signature Date/Time: 11/18/2022/1:38:41 PM    Final    CT ANGIO HEAD NECK W WO CM (CODE STROKE)  Result Date: 11/17/2022 CLINICAL DATA:  Acute neurologic deficits EXAM: CT ANGIOGRAPHY HEAD AND NECK TECHNIQUE: Multidetector CT imaging of the head and neck was performed using the standard protocol during bolus administration of intravenous contrast. Multiplanar CT image reconstructions and MIPs were obtained to evaluate the vascular anatomy. Carotid stenosis measurements (when applicable) are obtained utilizing NASCET criteria, using the distal internal carotid diameter as the denominator. RADIATION DOSE REDUCTION: This exam was performed according to the departmental dose-optimization program which includes automated exposure control, adjustment of the mA and/or kV according to patient size and/or use of iterative reconstruction technique. CONTRAST:  75mL OMNIPAQUE IOHEXOL 350 MG/ML SOLN COMPARISON:  None Available. FINDINGS: CTA NECK FINDINGS SKELETON: There is no bony spinal canal stenosis. No lytic or blastic lesion. OTHER NECK: Normal pharynx, larynx and major salivary glands. No cervical lymphadenopathy. Unremarkable thyroid gland. UPPER CHEST: Biapical septal thickening. AORTIC ARCH: There is calcific atherosclerosis of the aortic arch. There is no aneurysm, dissection or hemodynamically significant stenosis of the visualized portion of the aorta. Conventional 3 vessel aortic branching pattern. The visualized proximal subclavian arteries are widely patent. RIGHT CAROTID SYSTEM: Normal without aneurysm, dissection or stenosis. LEFT CAROTID SYSTEM: No dissection, occlusion or aneurysm. Mild atherosclerotic calcification at the carotid bifurcation without hemodynamically significant stenosis.  VERTEBRAL ARTERIES: Left dominant configuration. Both origins are clearly patent. There is no dissection, occlusion or flow-limiting stenosis to the skull base (V1-V3 segments). CTA HEAD FINDINGS POSTERIOR CIRCULATION: --Vertebral arteries: Normal V4 segments. --Inferior cerebellar arteries: Normal. --Basilar artery: Normal. --Superior cerebellar arteries: Normal. --Posterior cerebral arteries (PCA): Normal. ANTERIOR CIRCULATION: --Intracranial internal carotid arteries: Atherosclerotic calcification of the internal carotid arteries at the skull base without hemodynamically significant stenosis. --Anterior cerebral arteries (ACA): Normal. Both A1 segments are present. Patent anterior communicating artery (a-comm). --Middle cerebral arteries (MCA): Normal. VENOUS SINUSES: As permitted by contrast timing, patent. ANATOMIC VARIANTS: None Review of the MIP images confirms the above findings. IMPRESSION: 1. No emergent large vessel occlusion or hemodynamically significant stenosis of the head or neck. 2. Biapical septal thickening, which may indicate pulmonary edema. Aortic atherosclerosis (ICD10-I70.0). Electronically Signed   By: Deatra Robinson M.D.   On: 11/17/2022 22:32   CT HEAD CODE STROKE WO CONTRAST  Result Date: 11/17/2022 CLINICAL DATA:  Code stroke.  Aphasia EXAM: CT HEAD WITHOUT CONTRAST TECHNIQUE: Contiguous axial images were  obtained from the base of the skull through the vertex without intravenous contrast. RADIATION DOSE REDUCTION: This exam was performed according to the departmental dose-optimization program which includes automated exposure control, adjustment of the mA and/or kV according to patient size and/or use of iterative reconstruction technique. COMPARISON:  None Available. FINDINGS: Brain: There is no mass, hemorrhage or extra-axial collection. The size and configuration of the ventricles and extra-axial CSF spaces are normal. There is hypoattenuation of the periventricular white matter,  most commonly indicating chronic ischemic microangiopathy. Old left cerebellar infarct. Vascular: No abnormal hyperdensity of the major intracranial arteries or dural venous sinuses. No intracranial atherosclerosis. Skull: The visualized skull base, calvarium and extracranial soft tissues are normal. Sinuses/Orbits: No fluid levels or advanced mucosal thickening of the visualized paranasal sinuses. No mastoid or middle ear effusion. The orbits are normal. ASPECTS [facility] Stroke Program Early CT Score) - Ganglionic level infarction (caudate, lentiform nuclei, internal capsule, insula, M1-M3 cortex): 7 - Supraganglionic infarction (M4-M6 cortex): 3 Total score (0-10 with 10 being normal): 10 IMPRESSION: 1. No acute intracranial abnormality. 2. ASPECTS is 10. These results were communicated to Dr. Brooke Dare at 9:57 pm on 11/17/2022 by text page via the [facility] messaging system. Electronically Signed   By: Deatra Robinson M.D.   On: 11/17/2022 21:58      HISTORY OF PRESENT ILLNESS SYDNY SCHNITZLER is a 82 y.o. right-handed woman with a past medical history significant for hypertension, hyperlipidemia, prediabetes, remote stroke (10/08/2021 with residual mild left hand weakness/numbness), CKD stage IIIa, right knee osteoarthritis, L4/L5 spinal stenosis s/p laminectomy   She is having dinner with her family when she had acute onset difficulty understanding what her family was saying to her, responding strangely to questions using incorrect words, and starting to eat her beings with her hands instead of her fork.  No focal weakness appreciated, almost nonverbal on initial EMS arrival and just laughing inappropriately.  Speech slightly improving by the time of arrival to ED, but remained clearly aphasic.   Family notes that she did have a steroid injection for pain in the lumbar back today, but this was not epidural or lumbar puncture.  They do note she has been having some dyspnea on exertion that is increased in the  last few weeks.     She notes her Repatha was recently stopped it due to itching complications and potentially muscleaches and she has started atorvastatin 10 mg 3 times weekly instead   LKW: 21:07 Thrombolytic given?: Yes at 22:02             Checklist of contraindications was reviewed and negative. Risks, benefits and alternatives were discussed  IA performed?: No,  Premorbid modified rankin scale:      1 - No significant disability. Able to carry out all usual activities, despite some symptoms   HOSPITAL COURSE Patient with history of hypertension, hyperlipidemia, remote stroke, CKD stage III, osteoarthritis and L4/L5 spinal stenosis status postlaminectomy presented with acute onset aphasia.  Patient symptoms were thought to be due to a stroke, and she was treated with TNK.  Symptoms resolved, and speech is now back to baseline.  MRI was negative for stroke, and it is thought that stroke was aborted by TNK.  Likely left brain stroke aborted by TNK Etiology, cryptogenic Code Stroke CT head No acute abnormality. ASPECTS 10.    CTA head & neck No LVO MRI no acute intracranial abnormality 2D Echo EF 60-65% Loop recorder interrogation no A-fib LDL 55 HgbA1c 6.5  VTE prophylaxis - SCDs clopidogrel 75 mg daily prior to admission, now on aspirin and Plavix for 3 weeks followed by Plavix alone.  Patient also enrolled into [place] stroke trial Therapy recommendations: None Disposition: Home today   History of stroke 09/2021 admitted for left arm weakness, alien hand.  CT no acute abnormality.  CTA head and neck unremarkable.  MRI showed right parietal small linear infarct.  EF 60 to 65%, negative for DVT.  LDL 35, A1c 5.9.  Loop recorder placed.  Discharged on DAPT and Repatha   Hypertension Home meds:  None BP goal less than 180/105 after TNK Long-term BP goal normotensive   Hyperlipidemia Home meds:  Atorvastatin 10mg  3 times per week, resumed in hospital LDL 55, goal <  70 Continue statin at discharge No high intensity statin as patient LDL at goal, and did not tolerate repatha or higher dose/frequency of statin   Other Stroke Risk Factors Advanced Age >/= 81  Obesity, Body mass index is 32.95 kg/m., BMI >/= 30 associated with increased stroke risk, recommend weight loss, diet and exercise as appropriate    Other Active Problems Hypokalemia, K 2.8 supplement Chronic back pain  S/p L4/L5 spinal stenosis s/p laminectomy Recent cortisone injection back and on prednisone taper pack   DISCHARGE EXAM Blood pressure (!) 150/71, pulse (!) 52, temperature (!) 96.4 F (35.8 C), temperature source Axillary, resp. rate 20, height 5\' 6"  (1.676 m), weight 92.6 kg, SpO2 95 %. General: Alert, well-nourished, well-developed elderly patient in no acute distress Respiratory: Regular, unlabored respirations on room air  NEURO:  Mental Status: AA&Ox3  Speech/Language: speech is without dysarthria or aphasia.  Naming, repetition, fluency, and comprehension intact.  Cranial Nerves:  II: PERRL. Visual fields full.  III, IV, VI: EOMI. Eyelids elevate symmetrically.  V: Sensation is intact to light touch and symmetrical to face.  VII: Smile is symmetrical.  VIII: hearing intact to voice. IX, X: Phonation is normal.  XII: tongue is midline without fasciculations. Motor: 5/5 strength to all muscle groups tested.  Tone: is normal and bulk is normal Sensation- Intact to light touch bilaterally.  Coordination: FTN intact bilaterally.No drift.  Gait- deferred   Discharge Diet       Diet   Diet heart healthy/carb modified Room service appropriate? Yes; Fluid consistency: Thin   liquids  DISCHARGE PLAN Disposition: Home aspirin 81 mg daily and clopidogrel 75 mg daily for secondary stroke prevention for 3 weeks then Plavix alone. Ongoing stroke risk factor control by Primary Care Physician at time of discharge Follow-up PCP [name], MD in 2 weeks. Follow-up  in [facility] in 4 weeks, office to schedule an appointment.   35 minutes were spent preparing discharge.  Cortney E Ernestina Columbia , MSN, AGACNP-BC [facility] See Amion for schedule and pager information 11/19/2022 4:48 PM  ATTENDING NOTE: I reviewed above note and agree with the assessment and plan. Pt was seen and examined.   Patient sitting in chair, no family at bedside.  Neuro intact, no focal deficit.  MRI no acute infarct.  Patient will be discharged on DAPT and home statin.  She also interesting Librexia stroke trial, after reviewing consent form, she agreed to participate.  She will be discharged after first dose of the investigational medication.  Follow-up at [facility] in 4 weeks  For detailed assessment and plan, please refer to above/below as I have made changes wherever appropriate.   Marvel Plan, MD PhD Stroke Neurology 11/19/2022 7:58  PM

## 2022-11-19 NOTE — Discharge Instructions (Addendum)
Ebony [name], you were admitted with acute onset aphasia (inability to speak or understand speech).  You were thought to be having a stroke and were treated with TNK.  Your MRI was negative for signs of stroke, so it is likely that the TNK stopped a stroke in progress.  You will need to take aspirin and Plavix daily for three weeks followed by Plavix alone, and you will also need to be seen in the stroke clinic in 8 weeks.

## 2022-11-19 NOTE — Progress Notes (Signed)
Study Discontinued on 26Feb2024  [place] [facility] New Study Start: LIBREXIA-STROKE   SUMMARY For more information refer to: ClinicalTrials.gov. Study Identifier: [id number] A Phase 3, Randomized, Double-Blind, Parallel-Group, Placebo-Controlled Study to Demonstrate the Efficacy and Safety of Milvexian, an Oral Factor XIa Inhibitor, for Stroke Prevention after an Acute Ischemic Stroke or High-Risk Transient Ischemic Attack  Brief Summary This study will evaluate the efficacy and safety of milvexian in participants after an acute ischemic stroke or high-risk TIA who are receiving antiplatelet therapy standard-of-care.  Design Phase 3, Randomized, Double-Blind, Interventional, Event-Driven  Intervention [id number] (Milvexian) 25 mg or Placebo     Concomitant Therapy Participants will receive SAPT or DAPT. The SAPT or DAPT may be started prior to randomization and the type of antiplatelet agent(s), and duration of treatment will be at the discretion of the investigator. If ASA is used, it will be limited to low dose (75 to 100 mg/day) NSAID (except ASA) may be used concomitantly on a temporary basis but should be avoided for chronic use more than 4 weeks of consecutive therapy)  Prohibited Therapy Chronic (>4 weeks of consecutive use) use of ASA >100 mg per day Current or planned use of isoniazid Concomitant use of omeprazole or esomeprazole with clopidogrel is prohibited. Other use of PPI is allowed and encouraged Additional anticoagulants (e.g., vitamin k antagonists, factor IIa or FXa inhibitors) Use of a combined P-gp and strong CYP3A4 inhibitor (e.g., atazanavir, clarithromycin, itraconazole, ketoconazole, ritonavir, saquinavir) within 7 days of receiving study intervention and during the study is prohibited Use of a combined P-gp and strong CYP3A4 inducer (e.g., carbamazepine, phenytoin, rifampin) within 7 days of receiving study intervention and during the study is  prohibited * Prohibited therapies may be administered on a temporary basis, and if administered, the investigator should discontinue the study intervention. Study intervention may be restarted after the prohibited therapy has been discontinued and after the completion of a suitable washout period at the investigator's discretion  Anticoagulation Prophylaxis The use of anticoagulants for post-stroke DVT prophylaxis after the 3-day window is prohibited and non-pharmacological prophylaxis (e.g., intermittent pneumatic compression) is recommended.   Potential Drug-Drug Interactions Milvexian metabolism: Substrate of CYP3A4; use caution with coadministration of strong CYP3A4 inducers and inhibitors  Administration  Take 1 tablet by mouth twice daily without regards to food intake, at approximately the same time each day. For participants unable to swallow medication, the tablet can be dispersed in water and given via NG tube or in applesauce.  Missed dose If a dose of medication is missed, the dose should be taken as soon as possible. If the missed dose cannot be taken at regular time, next dose should not be doubled. Resume at the next scheduled dose      Plan: Start [Milvexian 25 mg tablets or placebo] today. Study medication must be picked up from pharmacy, medication can not be tubed.    Please contact [facility] if any questions or concerns regarding the study medication.    Dhyana Bastone, PharmD, BCPS [facility] Pharmacist  [phone]  

## 2022-11-19 NOTE — Evaluation (Signed)
Occupational Therapy Evaluation Patient Details Name: Ebony [name] MRN: [id number] DOB: 1941-03-26 Today's Date: 11/19/2022   History of Present Illness 82 yo right-handed woman presented 11/17/22 with aphasia. Given TNK. PMH: HTN, basal cell cancer, TIA, osteopenia, heart murmur, lumbar decompressive sx 09/12/21.   Clinical Impression   Prior to this admission, patient living with grandson and friend, independent with ADLs and still driving. Patient would occasionally use a cane in the community. Patient reporting she is close to her baseline upon OT assessment, no deficits noted.  Patient set up for all ADLs, and supervision with functional ambulation, and able to complete dual task activities in the hallway. OT educating on BE FAST acronym at end of session, OT will sign off acutely. Please re-consult if further acute needs arise.      Recommendations for follow up therapy are one component of a multi-disciplinary discharge planning process, led by the attending physician.  Recommendations may be updated based on patient status, additional functional criteria and insurance authorization.   Assistance Recommended at Discharge Intermittent Supervision/Assistance (initially but will progress quickly)  Patient can return home with the following Assist for transportation    Functional Status Assessment  Patient has had a recent decline in their functional status and demonstrates the ability to make significant improvements in function in a reasonable and predictable amount of time.  Equipment Recommendations  None recommended by OT    Recommendations for Other Services       Precautions / Restrictions Precautions Precautions: Fall      Mobility Bed Mobility Overal bed mobility: Needs Assistance Bed Mobility: Supine to Sit, Sit to Supine     Supine to sit: HOB elevated, Supervision Sit to supine: Supervision   General bed mobility comments: supervision for line management     Transfers Overall transfer level: Needs assistance Equipment used: 1 person hand held assist Transfers: Sit to/from Stand Sit to Stand: Supervision           General transfer comment: on person HHA available for patient, however patient did not require, able to dual task while ambulating      Balance Overall balance assessment: Mild deficits observed, not formally tested                                         ADL either performed or assessed with clinical judgement   ADL Overall ADL's : Needs assistance/impaired Eating/Feeding: Set up;Sitting   Grooming: Set up;Sitting;Standing   Upper Body Bathing: Set up;Sitting   Lower Body Bathing: Set up;Sit to/from stand;Sitting/lateral leans   Upper Body Dressing : Set up;Sitting   Lower Body Dressing: Set up;Sitting/lateral leans;Sit to/from stand   Toilet Transfer: Set up;Ambulation   Toileting- Clothing Manipulation and Hygiene: Set up;Sit to/from stand;Sitting/lateral lean       Functional mobility during ADLs: Set up General ADL Comments: Patient reporting she is close to her baseline upon OT assessment, no deficits noted. OT educating on BE FAST acronym at end of session, OT will sign off.     Vision Baseline Vision/History: 1 Wears glasses Ability to See in Adequate Light: 0 Adequate Patient Visual Report: No change from baseline Vision Assessment?: Yes Eye Alignment: Within Functional Limits Ocular Range of Motion: Within Functional Limits Alignment/Gaze Preference: Within Defined Limits Tracking/Visual Pursuits: Able to track stimulus in all quads without difficulty Saccades: Within functional limits Convergence: Within functional limits Visual Fields:  No apparent deficits     Perception     Praxis      Pertinent Vitals/Pain Pain Assessment Pain Assessment: No/denies pain     Hand Dominance Right   Extremity/Trunk Assessment Upper Extremity Assessment Upper Extremity  Assessment: Overall WFL for tasks assessed   Lower Extremity Assessment Lower Extremity Assessment: Defer to PT evaluation   Cervical / Trunk Assessment Cervical / Trunk Assessment: Normal   Communication Communication Communication: HOH   Cognition Arousal/Alertness: Awake/alert Behavior During Therapy: WFL for tasks assessed/performed Overall Cognitive Status: Within Functional Limits for tasks assessed                                       General Comments  VSS on RA    Exercises     Shoulder Instructions      Home Living Family/patient expects to be discharged to:: Private residence Living Arrangements: Other relatives;Non-relatives/Friends (grandson; friend) Available Help at Discharge: Family;Available PRN/intermittently Type of Home: House (townhouse) Home Access: Level entry     Home Layout: One level     Bathroom Shower/Tub: Chief Strategy Officer: Handicapped height Bathroom Accessibility: Yes   Home Equipment: Rollator (4 wheels);Cane - single point;Shower seat          Prior Functioning/Environment Prior Level of Function : Independent/Modified Independent;Driving             Mobility Comments: no device indoors; cane in community "sometimes"; reports has fallen 3x, but none in the past 6 months ADLs Comments: independent and driving        OT Problem List: Decreased activity tolerance      OT Treatment/Interventions:      OT Goals(Current goals can be found in the care plan section) Acute Rehab OT Goals Patient Stated Goal: to get back home OT Goal Formulation: With patient Time For Goal Achievement: 12/03/22 Potential to Achieve Goals: Good  OT Frequency:      Co-evaluation              AM-PAC OT "6 Clicks" Daily Activity     Outcome Measure Help from another person eating meals?: None Help from another person taking care of personal grooming?: None Help from another person toileting, which  includes using toliet, bedpan, or urinal?: None Help from another person bathing (including washing, rinsing, drying)?: None Help from another person to put on and taking off regular upper body clothing?: None Help from another person to put on and taking off regular lower body clothing?: None 6 Click Score: 24   End of Session Nurse Communication: Mobility status  Activity Tolerance: Patient tolerated treatment well Patient left: in bed;with call bell/phone within reach  OT Visit Diagnosis: Unsteadiness on feet (R26.81)                Time: 9147-8295 OT Time Calculation (min): 13 min Charges:  OT General Charges $OT Visit: 1 Visit OT Evaluation $OT Eval Moderate Complexity: 1 Mod  Pollyann Glen E. Sarinity Dicicco, OTR/L Acute Rehabilitation Services [phone]   Tinley Rought 11/19/2022, 11:23 AM

## 2022-11-19 NOTE — Progress Notes (Signed)
Physical Therapy Treatment Patient Details Name: Ebony [name] MRN: [id number] DOB: 1941-01-02 Today's Date: 11/19/2022   History of Present Illness 82 yo right-handed woman presented 11/17/22 with aphasia. Given TNK. PMH: HTN, basal cell cancer, TIA, osteopenia, heart murmur, lumbar decompressive sx 09/12/21.    PT Comments    Pt progressing well towards all goals. Pt with improved gait pattern this date with steady/stable gait without AD. Trialed SPC however pt unable to sequence properly.  Acute PT to cont to follow.  Recommendations for follow up therapy are one component of a multi-disciplinary discharge planning process, led by the attending physician.  Recommendations may be updated based on patient status, additional functional criteria and insurance authorization.  Follow Up Recommendations       Assistance Recommended at Discharge None  Patient can return home with the following     Equipment Recommendations  None recommended by PT    Recommendations for Other Services       Precautions / Restrictions Precautions Precautions: Fall     Mobility  Bed Mobility Overal bed mobility: Needs Assistance Bed Mobility: Supine to Sit     Supine to sit: HOB elevated, Supervision Sit to supine: Supervision   General bed mobility comments: supervision for line management    Transfers Overall transfer level: Needs assistance Equipment used: None Transfers: Sit to/from Stand Sit to Stand: Supervision           General transfer comment: no physical assist, safety for line management    Ambulation/Gait Ambulation/Gait assistance: Min guard Gait Distance (Feet): 150 Feet Assistive device: None Gait Pattern/deviations: Step-through pattern, Antalgic, Decreased stride length   Gait velocity interpretation: 1.31 - 2.62 ft/sec, indicative of limited community ambulator   General Gait Details: pt reports limps due to Left hip and low back pain, trialed cane however pt  steady without it and reports "I think I do better without it." Focused on increasing step length for more fluid gait pattern, pt with 2/4 DOE reporting this to be normal, SPO2 >93% on RA   Stairs             Wheelchair Mobility    Modified Rankin (Stroke Patients Only) Modified Rankin (Stroke Patients Only) Pre-Morbid Rankin Score: No significant disability Modified Rankin: No significant disability     Balance Overall balance assessment: Mild deficits observed, not formally tested                                          Cognition Arousal/Alertness: Awake/alert Behavior During Therapy: WFL for tasks assessed/performed Overall Cognitive Status: Within Functional Limits for tasks assessed                                          Exercises      General Comments General comments (skin integrity, edema, etc.): VSS on RA      Pertinent Vitals/Pain Pain Assessment Pain Assessment: No/denies pain    Home Living Family/patient expects to be discharged to:: Private residence Living Arrangements: Other relatives;Non-relatives/Friends (grandson; friend) Available Help at Discharge: Family;Available PRN/intermittently Type of Home: House (townhouse) Home Access: Level entry       Home Layout: One level Home Equipment: Rollator (4 wheels);Cane - single point;Shower seat      Prior Function  PT Goals (current goals can now be found in the care plan section) Acute Rehab PT Goals Patient Stated Goal: home today PT Goal Formulation: With patient Time For Goal Achievement: 12/02/22 Potential to Achieve Goals: Good Progress towards PT goals: Progressing toward goals    Frequency    Min 3X/week      PT Plan Current plan remains appropriate    Co-evaluation              AM-PAC PT "6 Clicks" Mobility   Outcome Measure  Help needed turning from your back to your side while in a flat bed without using  bedrails?: A Little Help needed moving from lying on your back to sitting on the side of a flat bed without using bedrails?: A Little Help needed moving to and from a bed to a chair (including a wheelchair)?: A Little Help needed standing up from a chair using your arms (e.g., wheelchair or bedside chair)?: A Little Help needed to walk in hospital room?: A Little Help needed climbing 3-5 steps with a railing? : A Little 6 Click Score: 18    End of Session Equipment Utilized During Treatment: Gait belt Activity Tolerance: Patient tolerated treatment well Patient left: in chair;with call bell/phone within reach;with chair alarm set;with family/visitor present Nurse Communication: Mobility status PT Visit Diagnosis: Difficulty in walking, not elsewhere classified (R26.2)     Time: 4403-4742 PT Time Calculation (min) (ACUTE ONLY): 13 min  Charges:  $Gait Training: 8-22 mins                     Lewis Shock, PT, DPT Acute Rehabilitation Services Secure chat preferred Office #: [phone]    Iona Hansen 11/19/2022, 2:33 PM

## 2022-11-19 NOTE — Research (Signed)
PATIENT: Ebony [name] DOB: 1940/12/02  Ebony [name] is a 82 y.o. female who meets inclusion and exclusion criteria and may be a potential candidate for [place] stroke study.  A brief description of the study's purpose and duration was provided to see if the patient was interested in participating. The patient was told that study participation is voluntary, and if she choose not to participate, she will continue receiving the same treatment as any other patient.   Since the patient seemed interested in the trial, a copy of the informed consent was provided to ger at 11 AM today and I said I would come back later to discuss the study in more detail after she had time to read the informed consent.  Detailed discussion  I returned at 2:20pm today to discuss the study in detail.  I requested she ask any questions she may have as we reviewed the informed consent so I could answer them as we went along. I discussed the standard-of-care treatments, appropriate alternative treatments, procedures or devices that might be helpful to the patients, the clinical trial, and each option's relative risks and benefits and alternatives. We discussed the research participant's bill of rights. Then, we also reviewed the ICF page by page and discussed their responsibilities if they choose to participate, any compensation, and what medical treatment is available if injury occurs. Ebony [name] was encouraged to discuss study participation with family, significant others, and primary care attending or physician to help reduce the possibility of undue influence by talking to the investigator alone.  The patient was reminded that the study participation is voluntary, and if she choose not to participate, she will continue receiving the same care as any other patient. Additionally, if she choose to participate and later decide she want to withdraw, she will continue to be treated as any other patient. Ebony [name] was told that  she did not have to decide now.  She asked me to call her daughter Ebony [name] for further discussion.  I called her daughter and answered all her questions and she is in agreement for her mom to participate.  [name] answered questions appropriately to verbalize understanding of the informed consent.   The subject agreed to participate in the [place] stroke trial and signed the Research Participants Annette Stable of Rights and the informed consent at 2:55pm today.  The informed consent was obtained prior to performance of any protocol-specific procedures for the subject.  A copy of the Research Participants Annette Stable of Rights and signed informed consent was given to the subject, and a copy placed in the subject's medical record.   Marvel Plan, MD PhD Stroke Neurology 11/19/2022 7:55 PM

## 2022-11-24 ENCOUNTER — Ambulatory Visit (INDEPENDENT_AMBULATORY_CARE_PROVIDER_SITE_OTHER): Payer: Medicare PPO

## 2022-11-24 DIAGNOSIS — I639 Cerebral infarction, unspecified: Secondary | ICD-10-CM

## 2022-11-24 LAB — CUP PACEART REMOTE DEVICE CHECK
Date Time Interrogation Session: 20240503231239
Implantable Pulse Generator Implant Date: 20230322

## 2022-11-26 ENCOUNTER — Telehealth: Payer: Self-pay

## 2022-11-26 NOTE — Telephone Encounter (Signed)
This RNCM received call from patient who reports she was inpatient on 11/17/22 for a stroke, in the trauma unit and ICU. Patient reports she did not receive any of her belongings and wants to receive her items back. Patient reports the following items are lost: necklace with a cross, shirt, and earrings.  This RNCM advised patient this will place a safety zone and request a call back to patient.   No TOC needs at this time.     - 10:00pm This RNCM attempted multiple times to submit safety zone in portal however received message that unable to submit due to technical difficulties. This RNCM left voicemail message for patient experience to call patient at [phone].

## 2022-11-28 DIAGNOSIS — E663 Overweight: Secondary | ICD-10-CM | POA: Diagnosis not present

## 2022-11-28 DIAGNOSIS — I129 Hypertensive chronic kidney disease with stage 1 through stage 4 chronic kidney disease, or unspecified chronic kidney disease: Secondary | ICD-10-CM | POA: Diagnosis not present

## 2022-11-28 DIAGNOSIS — F419 Anxiety disorder, unspecified: Secondary | ICD-10-CM | POA: Diagnosis not present

## 2022-11-28 DIAGNOSIS — N1831 Chronic kidney disease, stage 3a: Secondary | ICD-10-CM | POA: Diagnosis not present

## 2022-11-28 DIAGNOSIS — E785 Hyperlipidemia, unspecified: Secondary | ICD-10-CM | POA: Diagnosis not present

## 2022-11-28 DIAGNOSIS — I6523 Occlusion and stenosis of bilateral carotid arteries: Secondary | ICD-10-CM | POA: Diagnosis not present

## 2022-11-28 DIAGNOSIS — G720 Drug-induced myopathy: Secondary | ICD-10-CM | POA: Diagnosis not present

## 2022-11-28 DIAGNOSIS — Z8673 Personal history of transient ischemic attack (TIA), and cerebral infarction without residual deficits: Secondary | ICD-10-CM | POA: Diagnosis not present

## 2022-12-01 NOTE — Progress Notes (Signed)
Carelink Summary Report / Loop Recorder 

## 2022-12-02 DIAGNOSIS — L988 Other specified disorders of the skin and subcutaneous tissue: Secondary | ICD-10-CM | POA: Diagnosis not present

## 2022-12-02 DIAGNOSIS — Z85828 Personal history of other malignant neoplasm of skin: Secondary | ICD-10-CM | POA: Diagnosis not present

## 2022-12-02 DIAGNOSIS — D0371 Melanoma in situ of right lower limb, including hip: Secondary | ICD-10-CM | POA: Diagnosis not present

## 2022-12-16 ENCOUNTER — Other Ambulatory Visit (HOSPITAL_COMMUNITY): Payer: Self-pay

## 2022-12-16 MED ORDER — STUDY - LIBREXIA-STROKE - MILVEXIAN 25 MG OR PLACEBO TABLET (PI-SETHI): 1.0000 | ORAL_TABLET | Freq: Two times a day (BID) | ORAL | 0 refills | Status: DC

## 2022-12-17 ENCOUNTER — Other Ambulatory Visit: Payer: Self-pay | Admitting: Internal Medicine

## 2022-12-17 DIAGNOSIS — Z0000 Encounter for general adult medical examination without abnormal findings: Secondary | ICD-10-CM

## 2022-12-23 DIAGNOSIS — M25562 Pain in left knee: Secondary | ICD-10-CM | POA: Diagnosis not present

## 2022-12-24 DIAGNOSIS — N1831 Chronic kidney disease, stage 3a: Secondary | ICD-10-CM | POA: Diagnosis not present

## 2022-12-24 DIAGNOSIS — I129 Hypertensive chronic kidney disease with stage 1 through stage 4 chronic kidney disease, or unspecified chronic kidney disease: Secondary | ICD-10-CM | POA: Diagnosis not present

## 2022-12-24 DIAGNOSIS — Z8673 Personal history of transient ischemic attack (TIA), and cerebral infarction without residual deficits: Secondary | ICD-10-CM | POA: Diagnosis not present

## 2022-12-24 DIAGNOSIS — G720 Drug-induced myopathy: Secondary | ICD-10-CM | POA: Diagnosis not present

## 2022-12-24 DIAGNOSIS — F419 Anxiety disorder, unspecified: Secondary | ICD-10-CM | POA: Diagnosis not present

## 2022-12-24 DIAGNOSIS — R0600 Dyspnea, unspecified: Secondary | ICD-10-CM | POA: Diagnosis not present

## 2022-12-24 NOTE — Progress Notes (Signed)
Carelink Summary Report / Loop Recorder 

## 2022-12-26 DIAGNOSIS — M1712 Unilateral primary osteoarthritis, left knee: Secondary | ICD-10-CM | POA: Diagnosis not present

## 2022-12-29 ENCOUNTER — Ambulatory Visit (INDEPENDENT_AMBULATORY_CARE_PROVIDER_SITE_OTHER): Payer: Medicare PPO

## 2022-12-29 DIAGNOSIS — I639 Cerebral infarction, unspecified: Secondary | ICD-10-CM

## 2022-12-30 LAB — CUP PACEART REMOTE DEVICE CHECK
Date Time Interrogation Session: 20240609231530
Implantable Pulse Generator Implant Date: 20230322

## 2022-12-31 ENCOUNTER — Other Ambulatory Visit: Payer: Self-pay | Admitting: Podiatry

## 2022-12-31 DIAGNOSIS — M4856XA Collapsed vertebra, not elsewhere classified, lumbar region, initial encounter for fracture: Secondary | ICD-10-CM | POA: Diagnosis not present

## 2022-12-31 DIAGNOSIS — M5451 Vertebrogenic low back pain: Secondary | ICD-10-CM | POA: Diagnosis not present

## 2023-01-01 NOTE — Telephone Encounter (Signed)
Patient will need a new appointment before refills can be submitted.  Has not been seen in 2024.

## 2023-01-07 ENCOUNTER — Ambulatory Visit
Admission: RE | Admit: 2023-01-07 | Discharge: 2023-01-07 | Disposition: A | Discharge status: Home / Self Care | Payer: Medicare PPO | Source: Ambulatory Visit | Attending: Internal Medicine | Admitting: Internal Medicine

## 2023-01-07 ENCOUNTER — Ambulatory Visit: Payer: Medicare PPO

## 2023-01-07 DIAGNOSIS — Z0000 Encounter for general adult medical examination without abnormal findings: Secondary | ICD-10-CM

## 2023-01-07 DIAGNOSIS — Z1231 Encounter for screening mammogram for malignant neoplasm of breast: Secondary | ICD-10-CM | POA: Diagnosis not present

## 2023-01-15 DIAGNOSIS — G4700 Insomnia, unspecified: Secondary | ICD-10-CM | POA: Diagnosis not present

## 2023-01-15 DIAGNOSIS — F329 Major depressive disorder, single episode, unspecified: Secondary | ICD-10-CM | POA: Diagnosis not present

## 2023-01-15 DIAGNOSIS — I129 Hypertensive chronic kidney disease with stage 1 through stage 4 chronic kidney disease, or unspecified chronic kidney disease: Secondary | ICD-10-CM | POA: Diagnosis not present

## 2023-01-15 DIAGNOSIS — F419 Anxiety disorder, unspecified: Secondary | ICD-10-CM | POA: Diagnosis not present

## 2023-01-15 DIAGNOSIS — R2681 Unsteadiness on feet: Secondary | ICD-10-CM | POA: Diagnosis not present

## 2023-01-15 DIAGNOSIS — E669 Obesity, unspecified: Secondary | ICD-10-CM | POA: Diagnosis not present

## 2023-01-15 DIAGNOSIS — M1990 Unspecified osteoarthritis, unspecified site: Secondary | ICD-10-CM | POA: Diagnosis not present

## 2023-01-15 DIAGNOSIS — M545 Low back pain, unspecified: Secondary | ICD-10-CM | POA: Diagnosis not present

## 2023-01-15 DIAGNOSIS — I739 Peripheral vascular disease, unspecified: Secondary | ICD-10-CM | POA: Diagnosis not present

## 2023-01-16 DIAGNOSIS — H04123 Dry eye syndrome of bilateral lacrimal glands: Secondary | ICD-10-CM | POA: Diagnosis not present

## 2023-01-16 DIAGNOSIS — Z961 Presence of intraocular lens: Secondary | ICD-10-CM | POA: Diagnosis not present

## 2023-01-16 DIAGNOSIS — H524 Presbyopia: Secondary | ICD-10-CM | POA: Diagnosis not present

## 2023-01-20 ENCOUNTER — Ambulatory Visit: Payer: Medicare PPO | Admitting: Podiatry

## 2023-01-20 DIAGNOSIS — L723 Sebaceous cyst: Secondary | ICD-10-CM | POA: Diagnosis not present

## 2023-01-20 DIAGNOSIS — N952 Postmenopausal atrophic vaginitis: Secondary | ICD-10-CM | POA: Diagnosis not present

## 2023-01-20 DIAGNOSIS — N644 Mastodynia: Secondary | ICD-10-CM | POA: Diagnosis not present

## 2023-01-21 NOTE — Progress Notes (Signed)
Carelink Summary Report / Loop Recorder 

## 2023-01-23 ENCOUNTER — Other Ambulatory Visit: Payer: Self-pay | Admitting: Gynecology

## 2023-01-23 DIAGNOSIS — N644 Mastodynia: Secondary | ICD-10-CM

## 2023-01-24 NOTE — Progress Notes (Unsigned)
Cardiology Office Note:  .   Date:  01/26/2023  ID:  Ebony [name], DOB September 01, 1940, MRN [id number] PCP: [name], MD  [facility] Providers Cardiologist:  Rollene Rotunda, MD {   History of Present Illness: [name]   Ebony [name] is a 82 y.o. female with a history of CVA with implantable loop recorder in place, HLD, prediabetes, HTN, anxiety, CKD stage IIIa,. Patient is followed by Dr. Antoine Poche and presents today for her yearly follow up appointment.   Patient was first seen by cardiology in 09/2021 while admitted for treatment of a cryptogenic stroke. Echocardiogram on 10/09/21 showed EF 60-65%, no regional wall motion abnormalities, grade I DD, normal RV systolic function, trivial MR. Overall, there was no intracardiac source of embolism detected. Patient had a loop recorder implanted to monitor for atrial fibrillation. There have been no episodes of afib noted on loop recorder. She was treated with plavix and lipitor.   Patient was recently admitted from 4/29/-11/19/22 for treatment of left ICA ischemic stroke. Echocardiogram 11/18/22 showed EF 60-65%, no regional wall motion abnormalities, grade I DD, normal RV systolic function. Patient was discharged on ASA and plavix for 3 weeks followed by plavix alone. Also enrolled in the librexia stroke trial   Patient reports that she has been doing well from a heart standpoint recently. She was admitted with a stroke in 10/2022, and reports that she has been doing well since being discharged. Denies chest pain, syncope/near syncope. She occasionally has shortness of breath on exertion, but she has not been active because she fractured her vertebrae. Occasionally gets palpitations when she exerts herself, but no palpitations otherwise. Has bruising on plavix, but denies significant bleeding, blood in urine, or blood in stools. She follows a low sodium/heart healthy diet.   ROS: No chest pain, syncope, near syncope. Gets ankle edema if she does not  follow a low sodium diet   Studies Reviewed: .    Cardiac Studies & Procedures       ECHOCARDIOGRAM  ECHOCARDIOGRAM COMPLETE 11/18/2022  Narrative ECHOCARDIOGRAM REPORT    Patient Name:   Ebony [name] Date of Exam: 11/18/2022 Medical Rec #:  [id number]      Height:       66.0 in Accession #:    [id number]     Weight:       204.1 lb Date of Birth:  Mar 08, 1941       BSA:          2.017 m Patient Age:    82 years       BP:           145/68 mmHg Patient Gender: F              HR:           55 bpm. Exam Location:  Inpatient  Procedure: 2D Echo, Cardiac Doppler and Color Doppler  Indications:    Stroke  History:        Patient has prior history of Echocardiogram examinations, most recent 10/09/2021. Stroke; Risk Factors:Hypertension. Cancer.  Sonographer:    Milda Smart Referring Phys: Gordy Councilman   Sonographer Comments: Technically difficult study due to poor echo windows. Image acquisition challenging due to patient body habitus and Image acquisition challenging due to respiratory motion. IMPRESSIONS   1. Left ventricular ejection fraction, by estimation, is 60 to 65%. The left ventricle has normal function. The left ventricle has no regional wall motion abnormalities. Left ventricular diastolic parameters are  consistent with Grade I diastolic dysfunction (impaired relaxation). 2. Right ventricular systolic function is normal. The right ventricular size is normal. 3. The mitral valve is normal in structure. No evidence of mitral valve regurgitation. No evidence of mitral stenosis. 4. The aortic valve is tricuspid. There is mild calcification of the aortic valve. Aortic valve regurgitation is trivial.  Comparison(s): No significant change from prior study.  Conclusion(s)/Recommendation(s): No intracardiac source of embolism detected on this transthoracic study. Consider a transesophageal echocardiogram to exclude cardiac source of embolism if clinically  indicated.  FINDINGS Left Ventricle: Left ventricular ejection fraction, by estimation, is 60 to 65%. The left ventricle has normal function. The left ventricle has no regional wall motion abnormalities. The left ventricular internal cavity size was normal in size. There is no left ventricular hypertrophy. Left ventricular diastolic parameters are consistent with Grade I diastolic dysfunction (impaired relaxation).  Right Ventricle: The right ventricular size is normal. No increase in right ventricular wall thickness. Right ventricular systolic function is normal.  Left Atrium: Left atrial size was normal in size.  Right Atrium: Right atrial size was normal in size.  Pericardium: Trivial pericardial effusion is present. Presence of epicardial fat layer.  Mitral Valve: The mitral valve is normal in structure. Mild to moderate mitral annular calcification. No evidence of mitral valve regurgitation. No evidence of mitral valve stenosis.  Tricuspid Valve: The tricuspid valve is normal in structure. Tricuspid valve regurgitation is mild . No evidence of tricuspid stenosis.  Aortic Valve: The aortic valve is tricuspid. There is mild calcification of the aortic valve. There is mild aortic valve annular calcification. Aortic valve regurgitation is trivial.  Pulmonic Valve: The pulmonic valve was normal in structure. Pulmonic valve regurgitation is not visualized. No evidence of pulmonic stenosis.  Aorta: The aortic root and ascending aorta are structurally normal, with no evidence of dilitation.  IAS/Shunts: The atrial septum is grossly normal.   LEFT VENTRICLE PLAX 2D LVIDd:         5.20 cm     Diastology LVIDs:         3.30 cm     LV e' medial:    5.11 cm/s LV PW:         0.80 cm     LV E/e' medial:  10.3 LV IVS:        0.70 cm     LV e' lateral:   6.85 cm/s LVOT diam:     2.00 cm     LV E/e' lateral: 7.7 LVOT Area:     3.14 cm  LV Volumes (MOD) LV vol d, MOD A4C: 65.5 ml LV vol s,  MOD A4C: 22.0 ml LV SV MOD A4C:     65.5 ml  RIGHT VENTRICLE RV S prime:     13.50 cm/s TAPSE (M-mode): 1.6 cm  LEFT ATRIUM             Index        RIGHT ATRIUM           Index LA diam:        3.90 cm 1.93 cm/m   RA Area:     17.00 cm LA Vol (A2C):   27.8 ml 13.78 ml/m  RA Volume:   46.60 ml  23.10 ml/m LA Vol (A4C):   24.9 ml 12.34 ml/m LA Biplane Vol: 27.3 ml 13.53 ml/m  AORTA Ao Root diam: 3.00 cm Ao Asc diam:  3.70 cm  MITRAL VALVE  TRICUSPID VALVE MV Area (PHT): 1.89 cm    TR Peak grad:   14.6 mmHg MV Decel Time: 401 msec    TR Mean grad:   11.0 mmHg MV E velocity: 52.50 cm/s  TR Vmax:        191.00 cm/s MV A velocity: 65.10 cm/s  TR Vmean:       162.0 cm/s MV E/A ratio:  0.81 SHUNTS Systemic Diam: 2.00 cm  Riley Lam MD Electronically signed by Riley Lam MD Signature Date/Time: 11/18/2022/1:38:41 PM    Final              Risk Assessment/Calculations:             Physical Exam:   VS:  BP 130/74 (BP Location: Left Arm, Patient Position: Sitting, Cuff Size: Normal)   Pulse 86   Ht 5\' 6"  (1.676 m)   Wt 195 lb 9.6 oz (88.7 kg)   SpO2 95%   BMI 31.57 kg/m    Wt Readings from Last 3 Encounters:  01/26/23 195 lb 9.6 oz (88.7 kg)  11/17/22 204 lb 2.3 oz (92.6 kg)  12/09/21 194 lb 12.8 oz (88.4 kg)    GEN: Well nourished, well developed in no acute distress NECK: No JVD; No carotid bruits CARDIAC: RRR, no murmurs, rubs, gallops. Radial pulses 2+ bilaterally  RESPIRATORY:  Clear to auscultation without rales, wheezing or rhonchi. Normal work of breathing on room air  ABDOMEN: Soft, non-tender, non-distended EXTREMITIES:  Trace edema in BLE. No deformities   ASSESSMENT AND PLAN: .    History of CVA  - Previously had CVA in 09/2021 and had a loop recorder implanted. More recently, patient was admitted from 4/29-11/19/22 with a left ICA ischemic stroke  - Echocardiogram 4/30 showed EF 60-65%, no regional wall motion  abnormalities, grade I DD, normal RV systolic function, no intracardiac source of embolism  - Loop recorder has not detected any atrial fibrillation. Continue remote device checks every 3 months per Dr. Ladona Ridgel  - Continue plavix, lipitor 10 mg three days per week. Patient decided to not participate in the librexia trial. Denies bleeding on plavix. Hemoglobin 14.4 and platelets 265 in 10/2022  HLD  - Lipid panel from 10/2022 showed LDL 55, HDL 66, triglycerides 91, total cholesterol 139 - Continue lipitor 10 mg three times per week- patient did not tolerate repatha or higher doses/frequency of statins in the past  - Managed by PCP- she has a follow up appointment next month and lipid panel will be rechecked at that time   HTN  - BP well controlled on triamterene-hydrochlorothiazide 37.5-25 mg daily, losartan 25 mg daily  - Check BMP - patient has had issues with hypokalemia in the past   Ankle Edema  - Patient normally follows a heart healthy/low sodium diet, but did not follow this diet over the 4th of July. She did have significant ankle edema on July 4th, but it has since improved. Only has trace ankle edema on exam today  - Recent echocardiogram from 10/2022 showed EF 60-65% - Discussed importance of low sodium diet - Continue triamterene-hydrochlorothiazide    Dispo: 1 year   Signed, Jonita Albee, PA-C

## 2023-01-26 ENCOUNTER — Encounter: Payer: Self-pay | Admitting: Cardiology

## 2023-01-26 ENCOUNTER — Ambulatory Visit: Payer: Medicare PPO | Attending: Cardiology | Admitting: Cardiology

## 2023-01-26 VITALS — BP 130/74 | HR 86 | Ht 66.0 in | Wt 195.6 lb

## 2023-01-26 DIAGNOSIS — E785 Hyperlipidemia, unspecified: Secondary | ICD-10-CM

## 2023-01-26 DIAGNOSIS — I639 Cerebral infarction, unspecified: Secondary | ICD-10-CM | POA: Diagnosis not present

## 2023-01-26 DIAGNOSIS — M25473 Effusion, unspecified ankle: Secondary | ICD-10-CM

## 2023-01-26 DIAGNOSIS — I10 Essential (primary) hypertension: Secondary | ICD-10-CM | POA: Diagnosis not present

## 2023-01-26 LAB — BASIC METABOLIC PANEL
BUN/Creatinine Ratio: 17 (ref 12–28)
BUN: 19 mg/dL (ref 8–27)
CO2: 27 mmol/L (ref 20–29)
Calcium: 9.8 mg/dL (ref 8.7–10.3)
Chloride: 101 mmol/L (ref 96–106)
Creatinine, Ser: 1.15 mg/dL — ABNORMAL HIGH (ref 0.57–1.00)
Glucose: 116 mg/dL — ABNORMAL HIGH (ref 70–99)
Potassium: 4 mmol/L (ref 3.5–5.2)
Sodium: 144 mmol/L (ref 134–144)
eGFR: 48 mL/min/{1.73_m2} — ABNORMAL LOW (ref >59)

## 2023-01-26 NOTE — Patient Instructions (Signed)
Medication Instructions:  Your physician recommends that you continue on your current medications as directed. Please refer to the Current Medication list given to you today.  *If you need a refill on your cardiac medications before your next appointment, please call your pharmacy*   Lab Work: Your physician recommends that you have labs drawn today: BMET  If you have labs (blood work) drawn today and your tests are completely normal, you will receive your results only by: MyChart Message (if you have MyChart) OR A paper copy in the mail If you have any lab test that is abnormal or we need to change your treatment, we will call you to review the results.   Follow-Up: At [facility], you and your health needs are our priority.  As part of our continuing mission to provide you with exceptional heart care, we have created designated Provider Care Teams.  These Care Teams include your primary Cardiologist (physician) and Advanced Practice Providers (APPs -  Physician Assistants and Nurse Practitioners) who all work together to provide you with the care you need, when you need it.  We recommend signing up for the patient portal called "MyChart".  Sign up information is provided on this After Visit Summary.  MyChart is used to connect with patients for Virtual Visits (Telemedicine).  Patients are able to view lab/test results, encounter notes, upcoming appointments, etc.  Non-urgent messages can be sent to your provider as well.   To learn more about what you can do with MyChart, go to [email or web address].    Your next appointment:   12 month(s)  Provider:   Rollene Rotunda, MD  or Robet Leu, PA-C

## 2023-02-02 ENCOUNTER — Ambulatory Visit (INDEPENDENT_AMBULATORY_CARE_PROVIDER_SITE_OTHER): Payer: Medicare PPO

## 2023-02-02 DIAGNOSIS — I639 Cerebral infarction, unspecified: Secondary | ICD-10-CM

## 2023-02-03 DIAGNOSIS — M1712 Unilateral primary osteoarthritis, left knee: Secondary | ICD-10-CM | POA: Diagnosis not present

## 2023-02-03 LAB — CUP PACEART REMOTE DEVICE CHECK
Date Time Interrogation Session: 20240712231013
Implantable Pulse Generator Implant Date: 20230322

## 2023-02-10 ENCOUNTER — Inpatient Hospital Stay: Payer: Medicare PPO | Admitting: Family Medicine

## 2023-02-10 DIAGNOSIS — M5136 Other intervertebral disc degeneration, lumbar region: Secondary | ICD-10-CM | POA: Diagnosis not present

## 2023-02-10 DIAGNOSIS — M5459 Other low back pain: Secondary | ICD-10-CM | POA: Diagnosis not present

## 2023-02-10 DIAGNOSIS — S32020A Wedge compression fracture of second lumbar vertebra, initial encounter for closed fracture: Secondary | ICD-10-CM | POA: Insufficient documentation

## 2023-02-10 DIAGNOSIS — M5451 Vertebrogenic low back pain: Secondary | ICD-10-CM | POA: Diagnosis not present

## 2023-02-10 DIAGNOSIS — M1712 Unilateral primary osteoarthritis, left knee: Secondary | ICD-10-CM | POA: Diagnosis not present

## 2023-02-10 DIAGNOSIS — M4856XA Collapsed vertebra, not elsewhere classified, lumbar region, initial encounter for fracture: Secondary | ICD-10-CM | POA: Diagnosis not present

## 2023-02-12 ENCOUNTER — Other Ambulatory Visit: Payer: Self-pay | Admitting: Specialist

## 2023-02-12 ENCOUNTER — Ambulatory Visit: Payer: Medicare PPO | Admitting: Podiatry

## 2023-02-12 DIAGNOSIS — M5459 Other low back pain: Secondary | ICD-10-CM

## 2023-02-17 DIAGNOSIS — M1712 Unilateral primary osteoarthritis, left knee: Secondary | ICD-10-CM | POA: Diagnosis not present

## 2023-02-18 NOTE — Progress Notes (Signed)
Carelink Summary Report / Loop Recorder 

## 2023-02-24 ENCOUNTER — Other Ambulatory Visit: Payer: Self-pay

## 2023-02-24 DIAGNOSIS — M5459 Other low back pain: Secondary | ICD-10-CM | POA: Diagnosis not present

## 2023-02-24 NOTE — Patient Outreach (Signed)
Patient returned telephone outreach to patient. Obtained mRS was successfully completed. MRS= 1  Vanice Sarah [facility] Care Management Assistant [phone]

## 2023-02-24 NOTE — Patient Outreach (Signed)
First telephone outreach attempt to obtain mRS. No answer. Left message for returned call.  Philmore Pali [facility] Management Assistant [phone]

## 2023-02-25 DIAGNOSIS — D1801 Hemangioma of skin and subcutaneous tissue: Secondary | ICD-10-CM | POA: Diagnosis not present

## 2023-02-25 DIAGNOSIS — Z85820 Personal history of malignant melanoma of skin: Secondary | ICD-10-CM | POA: Diagnosis not present

## 2023-02-25 DIAGNOSIS — L738 Other specified follicular disorders: Secondary | ICD-10-CM | POA: Diagnosis not present

## 2023-02-25 DIAGNOSIS — D692 Other nonthrombocytopenic purpura: Secondary | ICD-10-CM | POA: Diagnosis not present

## 2023-02-25 DIAGNOSIS — L821 Other seborrheic keratosis: Secondary | ICD-10-CM | POA: Diagnosis not present

## 2023-02-25 DIAGNOSIS — Z85828 Personal history of other malignant neoplasm of skin: Secondary | ICD-10-CM | POA: Diagnosis not present

## 2023-03-03 ENCOUNTER — Ambulatory Visit: Payer: Medicare PPO | Admitting: Podiatry

## 2023-03-03 ENCOUNTER — Encounter: Payer: Self-pay | Admitting: Podiatry

## 2023-03-03 DIAGNOSIS — L603 Nail dystrophy: Secondary | ICD-10-CM

## 2023-03-03 DIAGNOSIS — M5459 Other low back pain: Secondary | ICD-10-CM | POA: Diagnosis not present

## 2023-03-03 DIAGNOSIS — L600 Ingrowing nail: Secondary | ICD-10-CM | POA: Diagnosis not present

## 2023-03-03 DIAGNOSIS — M5451 Vertebrogenic low back pain: Secondary | ICD-10-CM | POA: Diagnosis not present

## 2023-03-03 DIAGNOSIS — M4856XA Collapsed vertebra, not elsewhere classified, lumbar region, initial encounter for fracture: Secondary | ICD-10-CM | POA: Diagnosis not present

## 2023-03-03 DIAGNOSIS — M5136 Other intervertebral disc degeneration, lumbar region: Secondary | ICD-10-CM | POA: Diagnosis not present

## 2023-03-03 MED ORDER — NEOMYCIN-POLYMYXIN-HC 1 % OT SOLN: OTIC | 1 refills | Status: DC

## 2023-03-03 NOTE — Progress Notes (Signed)
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  She is complaining of pain to the toenails the hallux bilaterally it particular with sharp incurvated nail margins.  Objective: Vital signs are stable she is alert oriented x 3.  Pulses are palpable.  Sharply rated nail margins rendering mild hip erythema no edema cellulitis drainage or odor.  Tender on palpation.  Assessment: Ingrown toenails hallux bilateral.  Plan: Chemical matricectomy was performed to the tibial-fibular border the hallux right fibular border of the hallux left after local anesthetic was administered tolerated procedure well bilaterally.  She was given both oral written home-going instructions for the care and soaking of the toe as well as a prescription for Cortisporin Otic to be applied twice daily after soaking.  She had mentioned that she had itching across the top of her feet which did not reveal any type of reason for this very well could be an early neuropathy I recommended Biofreeze to see if that would help alleviate the symptoms.  Follow-up with her in 2 to 3 weeks

## 2023-03-03 NOTE — Patient Instructions (Signed)

## 2023-03-09 ENCOUNTER — Inpatient Hospital Stay
Admission: RE | Admit: 2023-03-09 | Discharge: 2023-03-09 | Disposition: A | Discharge status: Home / Self Care | Payer: Self-pay | Source: Ambulatory Visit | Attending: Interventional Radiology | Admitting: Interventional Radiology

## 2023-03-09 ENCOUNTER — Other Ambulatory Visit: Payer: Self-pay

## 2023-03-09 ENCOUNTER — Ambulatory Visit (INDEPENDENT_AMBULATORY_CARE_PROVIDER_SITE_OTHER): Payer: Medicare PPO

## 2023-03-09 ENCOUNTER — Ambulatory Visit
Admission: RE | Admit: 2023-03-09 | Discharge: 2023-03-09 | Disposition: A | Discharge status: Home / Self Care | Payer: Medicare PPO | Source: Ambulatory Visit | Attending: Specialist | Admitting: Specialist

## 2023-03-09 ENCOUNTER — Other Ambulatory Visit: Payer: Self-pay | Admitting: Interventional Radiology

## 2023-03-09 VITALS — BP 129/75 | HR 104 | Temp 98.0°F | Resp 14 | Wt 190.0 lb

## 2023-03-09 DIAGNOSIS — I639 Cerebral infarction, unspecified: Secondary | ICD-10-CM

## 2023-03-09 DIAGNOSIS — S32020G Wedge compression fracture of second lumbar vertebra, subsequent encounter for fracture with delayed healing: Secondary | ICD-10-CM | POA: Diagnosis not present

## 2023-03-09 DIAGNOSIS — M5459 Other low back pain: Secondary | ICD-10-CM

## 2023-03-09 DIAGNOSIS — S32020A Wedge compression fracture of second lumbar vertebra, initial encounter for closed fracture: Secondary | ICD-10-CM | POA: Diagnosis not present

## 2023-03-09 LAB — CUP PACEART REMOTE DEVICE CHECK
Date Time Interrogation Session: 20240818231831
Implantable Pulse Generator Implant Date: 20230322

## 2023-03-09 NOTE — Consult Note (Addendum)
Chief Complaint: Acute low back pain s/p L2 compression fracture  Referring Physician(s): Beane,Jeffrey  Patient Status: [facility] - Out-pt  History of Present Illness: Ebony [name] is a 82 y.o. female with history of Osteoporosis who experienced a fall approximately 8 weeks ago.  She has had intractable back pain since that time.  MRI of the lumbar spine performed on 02/24/2023 showed acute fracture of the superior end plate of L2 with approximately 20% height loss and no significant retropulsion.  She has been hesitant to take narcotics due to side effects.  Her pain has not been well controlled with Tylenol and bed rest.  She denies significant lower extremity weakness but feels weak overall due to inactivity.  Past Medical History:  Diagnosis Date   Arthritis    Cancer (HCC)    BASAL CELL CA   Depression    GERD (gastroesophageal reflux disease)    TAKES TUMS PRN   Hypertension    Osteopenia    Stroke (HCC)    TIA 2004- PT IS ON PLAVIX    Past Surgical History:  Procedure Laterality Date   BUNIONECTOMY Right    CARDIAC CATHETERIZATION     CATARACT EXTRACTION Bilateral    KNEE ARTHROPLASTY Right 11/29/2015   Procedure: RIGHT TOTAL KNEE ARTHROPLASTY ;  Surgeon: Samson Frederic, MD;  Location: [facility];  Service: Orthopedics;  Laterality: Right;   KNEE ARTHROSCOPY Right 10/05/2013   Procedure: RIGHT KNEE ARTHROSCOPY WITH medial and lateral DEBRIDEMENT chrondroplasty;  Surgeon: Loanne Drilling, MD;  Location: [facility]S;  Service: Orthopedics;  Laterality: Right;   LOOP RECORDER INSERTION N/A 10/09/2021   Procedure: LOOP RECORDER INSERTION;  Surgeon: Marinus Maw, MD;  Location: [facility] INVASIVE CV LAB;  Service: Cardiovascular;  Laterality: N/A;   LUMBAR LAMINECTOMY/DECOMPRESSION MICRODISCECTOMY N/A 09/12/2021   Procedure: Lumbar decompression Lumbar three - Lumbar four and lumbar four-five;  Surgeon: Jene Every, MD;  Location: [facility] OR;  Service: Orthopedics;  Laterality: N/A;   MOHS  SURGERY ON NOSE 01/2007, MOHS ON FACE 08/2012     TONSILLECTOMY      Allergies: Other, Codeine, Repatha [evolocumab], Statins, and Sulfa antibiotics  Medications: Prior to Admission medications   Medication Sig Start Date End Date Taking? Authorizing Provider  acetaminophen (TYLENOL) 500 MG tablet Take 1,000 mg by mouth every 6 (six) hours as needed for headache (pain).   Yes [provider]  albuterol (VENTOLIN HFA) 108 (90 Base) MCG/ACT inhaler Inhale 1-2 puffs into the lungs as needed. 12/24/22  Yes [provider]  allopurinol (ZYLOPRIM) 100 MG tablet Take 100 mg by mouth every morning. 11/23/18  Yes [provider]  ALPRAZolam Prudy Feeler) 0.5 MG tablet Take 0.5 mg by mouth at bedtime as needed for anxiety or sleep.  07/19/14  Yes [provider]  atorvastatin (LIPITOR) 10 MG tablet Take 10 mg by mouth See admin instructions. 10 mg once daily in the evening on Monday, Wednesday, Friday. 10/21/22  Yes [provider]  citalopram (CELEXA) 20 MG tablet Take 20 mg by mouth every morning. 11/07/15  Yes [provider]  clopidogrel (PLAVIX) 75 MG tablet Take 75 mg by mouth every morning.   Yes [provider]  losartan (COZAAR) 25 MG tablet Take 1 tablet by mouth daily. 12/24/22  Yes [provider]  NEOMYCIN-POLYMYXIN-HYDROCORTISONE (CORTISPORIN) 1 % SOLN OTIC solution Apply 1-2 drops to toe BID after soaking 03/03/23  Yes Hyatt, Max T, DPM  triamterene-hydrochlorothiazide (DYAZIDE) 37.5-25 MG capsule Take 1 capsule by mouth  every morning. 11/07/15  Yes [provider]  Vitamin D, Ergocalciferol, (DRISDOL) 1.25 MG (50000 UNIT) CAPS capsule Take 50,000 Units by mouth every Monday.   Yes [provider]  Study - LIBREXIA-STROKE - milvexian 25 mg or placebo tablet (PI-Sethi) Take 1 tablet by mouth 2 (two) times daily. For Investigational Use Only. Take with or without food at approximately the same time each day. Please contact  [facility] for any questions or concerns regarding this medication. Patient not taking: Reported on 03/09/2023 12/16/22   Micki Riley, MD     Family History  Problem Relation Age of Onset   Breast cancer Sister     Social History   Socioeconomic History   Marital status: Single    Spouse name: Not on file   Number of children: Not on file   Years of education: Not on file   Highest education level: Not on file  Occupational History   Not on file  Tobacco Use   Smoking status: Former    Current packs/day: 0.00    Average packs/day: 1 pack/day for 10.0 years (10.0 ttl pk-yrs)    Types: Cigarettes    Start date: 10/03/1961    Quit date: 10/04/1971    Years since quitting: 51.4   Smokeless tobacco: Never  Vaping Use   Vaping status: Never Used  Substance and Sexual Activity   Alcohol use: Yes    Comment: OCCAS ALCOHOL   Drug use: No   Sexual activity: Not on file  Other Topics Concern   Not on file  Social History Narrative   Not on file   Social Determinants of Health   Financial Resource Strain: Not on file  Food Insecurity: No Food Insecurity (11/07/2021)   Hunger Vital Sign    Worried About Running Out of Food in the Last Year: Never true    Ran Out of Food in the Last Year: Never true  Transportation Needs: No Transportation Needs (11/07/2021)   PRAPARE - Administrator, Civil Service (Medical): No    Lack of Transportation (Non-Medical): No  Physical Activity: Not on file  Stress: Not on file  Social Connections: Not on file   Review of Systems: A 12 point ROS discussed and pertinent positives are indicated in the HPI above.  All other systems are negative.  Review of Systems  Vital Signs: BP 129/75 (BP Location: Left Arm, Patient Position: Sitting, Cuff Size: Normal)   Pulse (!) 104   Temp 98 F (36.7 C) (Oral)   Resp 14   Wt 86.2 kg   SpO2 92%   BMI 30.67 kg/m   Advance Care Plan: The advanced care plan/surrogate  decision maker was discussed at the time of visit and the patient did not wish to discuss or was not able to name a surrogate decision maker or provide an advance care plan.    Physical Exam Constitutional:      Appearance: Normal appearance.  Cardiovascular:     Rate and Rhythm: Normal rate and regular rhythm.  Pulmonary:     Effort: Pulmonary effort is normal.     Breath sounds: Normal breath sounds.  Abdominal:     Palpations: Abdomen is soft.  Musculoskeletal:        General: No swelling.     Comments: Focal tenderness on palpation of mid to lower back.  Neurological:     Mental Status: She is alert and oriented to person, place, and time.  Psychiatric:  Mood and Affect: Mood normal.        Behavior: Behavior normal.     Imaging: No results found.  Labs:  CBC: Recent Labs    11/17/22 2147 11/17/22 2149  WBC 6.0  --   HGB 14.4 14.3  HCT 40.7 42.0  PLT 265  --     COAGS: Recent Labs    11/17/22 2147  INR 1.1  APTT 28    BMP: Recent Labs    11/17/22 2147 11/17/22 2149 11/18/22 0732 01/26/23 1100  NA 137 139 137 144  K 2.8* 2.8* 3.5 4.0  CL 99 102 103 101  CO2 21*  --  25 27  GLUCOSE 203* 201* 139* 116*  BUN 21 22 20 19   CALCIUM 9.6  --  8.9 9.8  CREATININE 1.13* 1.00 1.10* 1.15*  GFRNONAA 49*  --  50*  --     LIVER FUNCTION TESTS: Recent Labs    11/17/22 2147  BILITOT 0.4  AST 24  ALT 18  ALKPHOS 51  PROT 6.4*  ALBUMIN 3.5    TUMOR MARKERS: No results for input(s): "AFPTM", "CEA", "CA199", "CHROMGRNA" in the last 8760 hours.  Assessment & Plan:   Ms. Berke has suffered acute osteoporotic fracture of the L2 vertebra.  She and I discussed in detail the steps of the Kyphoplasty and moderate sedation. We also discussed the risks and benefits of Kyphoplasty including, but not limited to education regarding the natural healing process of compression fractures without intervention, bleeding, infection, cement migration which may  cause spinal cord damage, paralysis, pulmonary embolism or even death.  She was comfortable with me or one of my partners performing the procedure.  All of the patient's questions were answered, patient is agreeable to proceed.  History and exam have demonstrated the following:  Acute/Subacute fracture by imaging dated 02/24/2023, Pain on exam concordant with level of fracture, Inability to tolerate narcotic pain medication due to side effects, and Significant disability on the [employer] Disability Questionnaire with 21/24 positive symptoms, reflecting significant impact/impairment of (ADLs)   ICD-10-CM Codes that Support Medical Necessity (WelshBlog.at.aspx?articleId=[id number])  M80.08XA    Age-related osteoporosis with current pathological fracture, vertebra(e), initial encounter for fracture and S32.020A    Wedge compression fracture of second lumbar vertebra, initial encounter for closed fracture   Plan:  L2 vertebral body augmentation with balloon kyphoplasty  Post-procedure disposition: outpatient [facility]   The patient has suffered a fracture of the L2 vertebral body. It is recommended that patients aged 38 years or older be evaluated for possible testing or treatment of osteoporosis. A copy of this consult report is sent to the patient's referring physician.   Total time spent on today's visit was over  40 Minutes, including both face-to-face time and non face-to-face time, personally spent on review of chart (including labs and relevant imaging), discussing further workup and treatment options, referral to specialist if needed, reviewing outside records if pertinent, answering patient questions, and coordinating care regarding L2 compression fracture as well as management strategy.    Thank you for this interesting consult.  I greatly enjoyed meeting Ebony [name] and look forward to participating in their care.  A copy of this report  was sent to the requesting provider on this date.  Electronically Signed: Al Corpus Nera Haworth, MD 03/09/2023, 2:19 PM   I spent a total of  40 Minutes  in face to face in clinical consultation, greater than 50% of which was counseling/coordinating care for L2 compression  fracture.

## 2023-03-10 ENCOUNTER — Other Ambulatory Visit: Payer: Self-pay | Admitting: Specialist

## 2023-03-10 DIAGNOSIS — M5459 Other low back pain: Secondary | ICD-10-CM

## 2023-03-10 DIAGNOSIS — S32020A Wedge compression fracture of second lumbar vertebra, initial encounter for closed fracture: Secondary | ICD-10-CM

## 2023-03-10 DIAGNOSIS — M8008XA Age-related osteoporosis with current pathological fracture, vertebra(e), initial encounter for fracture: Secondary | ICD-10-CM

## 2023-03-11 LAB — CBC
HCT: 40.2 % (ref 35.0–45.0)
Hemoglobin: 13.9 g/dL (ref 11.7–15.5)
MCH: 34.4 pg — ABNORMAL HIGH (ref 27.0–33.0)
MCHC: 34.6 g/dL (ref 32.0–36.0)
MCV: 99.5 fL (ref 80.0–100.0)
MPV: 10 fL (ref 7.5–12.5)
Platelets: 281 10*3/uL (ref 140–400)
RBC: 4.04 10*6/uL (ref 3.80–5.10)
RDW: 12.1 % (ref 11.0–15.0)
WBC: 9.6 10*3/uL (ref 3.8–10.8)

## 2023-03-11 LAB — COMPLETE METABOLIC PANEL WITH GFR
AG Ratio: 1.4 (calc) (ref 1.0–2.5)
ALT: 20 U/L (ref 6–29)
AST: 22 U/L (ref 10–35)
Albumin: 4 g/dL (ref 3.6–5.1)
Alkaline phosphatase (APISO): 45 U/L (ref 37–153)
BUN: 16 mg/dL (ref 7–25)
CO2: 27 mmol/L (ref 20–32)
Calcium: 9.6 mg/dL (ref 8.6–10.4)
Chloride: 102 mmol/L (ref 98–110)
Creat: 0.7 mg/dL (ref 0.60–0.95)
Globulin: 2.9 g/dL (ref 1.9–3.7)
Glucose, Bld: 169 mg/dL — ABNORMAL HIGH (ref 65–99)
Potassium: 4.6 mmol/L (ref 3.5–5.3)
Sodium: 137 mmol/L (ref 135–146)
Total Bilirubin: 0.5 mg/dL (ref 0.2–1.2)
Total Protein: 6.9 g/dL (ref 6.1–8.1)
eGFR: 87 mL/min/{1.73_m2} (ref >=60)

## 2023-03-11 LAB — PROTIME-INR
INR: 1
Prothrombin Time: 10.4 s (ref 9.0–11.5)

## 2023-03-11 LAB — DUPLICATE REPORT

## 2023-03-11 LAB — HOUSE ACCOUNT TRACKING

## 2023-03-11 NOTE — Patient Instructions (Signed)
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 Continue clopidogrel 75 mg daily and atorvastatin for secondary stroke prevention.  Discussed secondary stroke prevention measures and importance of close PCP follow up for aggressive stroke risk factor management. I have gone over the pathophysiology of stroke, warning signs and symptoms, risk factors and their management in some detail with instructions to go to the closest emergency room for symptoms of concern. HTN: BP goal <130/90. Elevated, today. 142/91. Patient reports normal at home. Continue monitoring at home. Continue Diazide per PCP.  HLD: LDL goal <70. Recent LDL 35. Continue atorvastatin per PCP.  DMII: A1c goal<7.0. Recent A1c 6.5. Now in diabetic range. Continue close follow up with PCP. Monitor intake of carbohydrates.  Cardiac monitoring: loop recorder interrogation to rule out afib. Follow up with cardiology.  Obesity: continue healthy weight management habits with low carb diet and regular exercise.   Goals:  1) Maintain strict control of hypertension with blood pressure goal below 130/90 2) Maintain good control of diabetes with hemoglobin A1c goal below 7%  3) Maintain good control of lipids with LDL cholesterol goal below 70 mg/dL.  4) Eat a healthy diet with plenty of whole grains, cereals, fruits and vegetables, exercise regularly and maintain ideal body weight   Resources: https://www.williams.biz/  Please make sure you are staying well hydrated. I recommend 50-60 ounces daily. Well balanced diet and regular exercise encouraged. Consistent sleep schedule with 6-8 hours recommended.   Please continue follow up with care team as directed.   Follow up with me in 6 months   You may receive a survey regarding today's visit. I encourage you to leave honest feed back as I do use this information to  improve patient care. Thank you for seeing me today!

## 2023-03-11 NOTE — Progress Notes (Signed)
[facility] [address] [place]. [place] [address] [phone]       HOSPITAL FOLLOW UP NOTE  Ms. Ebony [name] Date of Birth:  [birth year] Medical Record Number:  [id number]   Reason for Referral:  hospital stroke follow up    SUBJECTIVE:   CHIEF COMPLAINT:  Chief Complaint  Patient presents with   Room 1    Pt is here Alone. Pt has been doing good since her stroke.     HPI:   XOPHIA ANGELI is a 82 y.o. who  has a past medical history of Arthritis, Cancer (HCC), Depression, GERD (gastroesophageal reflux disease), Hypertension, Osteopenia, and Stroke (HCC).  Patient presented on 10/08/2021 with left sided arm weakness, numbness and tingling. She woke that morning and able to take a shower. While getting dressed she had difficulty using left arm. Symptoms seemed to improve until later in the day when she had trouble holding her drink and hold cards with left hand while at a friends home. 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 LDL is at goal on Repatha, HbA1c 5.9%. Aspirin 81mg  was added to patient's PTA plavix 75mg . PT recommend outpatient therapy as patient's deficit is mild. OT no recommendations for outpatient. Due to concern for cardioembolic source not seen on transthoracic echocardiogram, a loop recorder was implanted prior to discharge.". Personally reviewed hospitalization pertinent progress notes, lab work and imaging.  Evaluated by Dr Roda Shutters.   Since being home, she feels that she is doing fine. She continues to note numbness of left 4th and 5th digits. Numbness if not limiting. She did not work with PT/OT outpatient. She does not feel she needs to. She continues to do exercises at home from inpatient PT/OT. She will occasionally drop something she is holding with the left hand. She has discontinued asa and now continued Plavix only. She has appt with Dr Antoine Poche on  5/22. She was seen by PCP the beginning of April. She has a long standing history of vertigo. She has more symptoms over the past two days. Symptoms usually resolve with rest. She is walking about a mile 1-3 times a week.   UPDATE 03/16/2023 Ebony [name] returns for follow up. She was initially seen by me 05/2022 for right parietal cortex infarct and small vessel ischemia and a small remote left cerebellar infarct. She felt she was mostly back to baseline. She was admitted 11/17/2022 following an event where she started acting strangely and had difficulty understanding her family. She was laughing inappropriately and eating with her fingers. EMS called and she was mostly non verbal. Speech slowly improved but was clearly still aphasic in ER. She was treated with TNK and symptoms resolved. Speech returned to baseline. Asa added for three weeks then Plavix alone. Previously on Plavix. LDL 55 on Repatha. A1C 6.5. She was enrolled into [place] stroke trial. No recommendations from PT/OT eval. She was discharged home 11/19/2022.   Since discharge, she reports that she is doing well. She denies any residual symptoms. BP has been normal at home. Usually around 130/80 on Dyazide. She continues Plavix. No unusual bleeding. She does have some bruising but feels she is due to taking atorvastatin. Dr Link Snuffer started her on atorvastatin, recently. She is taking M,W, F. She is tolerating well so far. She discontinued Repatha. She reports itching with injections. Tolerating Plavix. No unusual bleeding or bruising. She is monitored through cardiology. No events  noted on loop recorder thus far. She discontinued [place] trial. She reports that her grandson lives with her. She has a friend that also lives with her. She continues to drive without difficulty. She was walking but it has been too hot. She plans to resume exercise at gym after her kyphoplasty 03/19/2023.    PERTINENT IMAGING/LABS  Code Stroke CT head No acute abnormality.  ASPECTS 10.    CTA head & neck No LVO MRI no acute intracranial abnormality 2D Echo EF 60-65% Loop recorder interrogation no A-fib   A1C Lab Results  Component Value Date   HGBA1C 6.5 (H) 11/17/2022    Lipid Panel     Component Value Date/Time   CHOL 139 11/17/2022 2344   TRIG 91 11/17/2022 2344   HDL 66 11/17/2022 2344   CHOLHDL 2.1 11/17/2022 2344   VLDL 18 11/17/2022 2344   LDLCALC 55 11/17/2022 2344      ROS:   14 system review of systems performed and negative with exception of those listed in HPI  PMH:  Past Medical History:  Diagnosis Date   Arthritis    Cancer (HCC)    BASAL CELL CA   Depression    GERD (gastroesophageal reflux disease)    TAKES TUMS PRN   Hypertension    Osteopenia    Stroke (HCC)    TIA 2004- PT IS ON PLAVIX    PSH:  Past Surgical History:  Procedure Laterality Date   BUNIONECTOMY Right    CARDIAC CATHETERIZATION     CATARACT EXTRACTION Bilateral    KNEE ARTHROPLASTY Right 11/29/2015   Procedure: RIGHT TOTAL KNEE ARTHROPLASTY ;  Surgeon: Samson Frederic, MD;  Location: [facility];  Service: Orthopedics;  Laterality: Right;   KNEE ARTHROSCOPY Right 10/05/2013   Procedure: RIGHT KNEE ARTHROSCOPY WITH medial and lateral DEBRIDEMENT chrondroplasty;  Surgeon: Loanne Drilling, MD;  Location: [facility];  Service: Orthopedics;  Laterality: Right;   LOOP RECORDER INSERTION N/A 10/09/2021   Procedure: LOOP RECORDER INSERTION;  Surgeon: Marinus Maw, MD;  Location: [facility] INVASIVE CV LAB;  Service: Cardiovascular;  Laterality: N/A;   LUMBAR LAMINECTOMY/DECOMPRESSION MICRODISCECTOMY N/A 09/12/2021   Procedure: Lumbar decompression Lumbar three - Lumbar four and lumbar four-five;  Surgeon: Jene Every, MD;  Location: [facility] OR;  Service: Orthopedics;  Laterality: N/A;   MOHS SURGERY ON NOSE 01/2007, MOHS ON FACE 08/2012     TONSILLECTOMY      Social History:  Social History   Socioeconomic History   Marital status: Single    Spouse name: Not on file    Number of children: Not on file   Years of education: Not on file   Highest education level: Not on file  Occupational History   Not on file  Tobacco Use   Smoking status: Former    Current packs/day: 0.00    Average packs/day: 1 pack/day for 10.0 years (10.0 ttl pk-yrs)    Types: Cigarettes    Start date: 10/03/1961    Quit date: 10/04/1971    Years since quitting: 51.4   Smokeless tobacco: Never  Vaping Use   Vaping status: Never Used  Substance and Sexual Activity   Alcohol use: Yes    Comment: OCCAS ALCOHOL   Drug use: No   Sexual activity: Not on file  Other Topics Concern   Not on file  Social History Narrative   Not on file   Social Determinants of Health   Financial Resource Strain: Not on file  Food Insecurity:  No Food Insecurity (11/07/2021)   Hunger Vital Sign    Worried About Running Out of Food in the Last Year: Never true    Ran Out of Food in the Last Year: Never true  Transportation Needs: No Transportation Needs (11/07/2021)   PRAPARE - Administrator, Civil Service (Medical): No    Lack of Transportation (Non-Medical): No  Physical Activity: Not on file  Stress: Not on file  Social Connections: Not on file  Intimate Partner Violence: Not on file    Family History:  Family History  Problem Relation Age of Onset   Breast cancer Sister     Medications:   Current Outpatient Medications on File Prior to Visit  Medication Sig Dispense Refill   acetaminophen (TYLENOL) 500 MG tablet Take 1,000 mg by mouth every 6 (six) hours as needed for headache (pain).     albuterol (VENTOLIN HFA) 108 (90 Base) MCG/ACT inhaler Inhale 1-2 puffs into the lungs as needed.     allopurinol (ZYLOPRIM) 100 MG tablet Take 100 mg by mouth every morning.     ALPRAZolam (XANAX) 0.5 MG tablet Take 0.5 mg by mouth at bedtime as needed for anxiety or sleep.   0   atorvastatin (LIPITOR) 10 MG tablet Take 10 mg by mouth See admin instructions. 10 mg once daily in the  evening on Monday, Wednesday, Friday.     citalopram (CELEXA) 20 MG tablet Take 20 mg by mouth every morning.  1   clopidogrel (PLAVIX) 75 MG tablet Take 75 mg by mouth every morning.     losartan (COZAAR) 25 MG tablet Take 1 tablet by mouth daily.     NEOMYCIN-POLYMYXIN-HYDROCORTISONE (CORTISPORIN) 1 % SOLN OTIC solution Apply 1-2 drops to toe BID after soaking 10 mL 1   triamterene-hydrochlorothiazide (DYAZIDE) 37.5-25 MG capsule Take 1 capsule by mouth every morning.  6   Vitamin D, Ergocalciferol, (DRISDOL) 1.25 MG (50000 UNIT) CAPS capsule Take 50,000 Units by mouth every Monday.     Study - LIBREXIA-STROKE - milvexian 25 mg or placebo tablet (PI-Sethi) Take 1 tablet by mouth 2 (two) times daily. For Investigational Use Only. Take with or without food at approximately the same time each day. Please contact [facility] for any questions or concerns regarding this medication. (Patient not taking: Reported on 03/09/2023) 210 tablet 0   No current facility-administered medications on file prior to visit.    Allergies:   Allergies  Allergen Reactions   Other Anaphylaxis    Unknown muscle relaxer   Codeine Nausea And Vomiting   Repatha [Evolocumab] Itching   Statins Other (See Comments)    Myalgias  Pt ok with taking atorvastatin 10mg  M-W-F   Sulfa Antibiotics Other (See Comments)    Red streaks on nose- ?  Cellulitis       OBJECTIVE:  Physical Exam  Vitals:   03/16/23 0815  BP: (!) 142/91  Pulse: 96  Weight: 192 lb 8 oz (87.3 kg)  Height: 5\' 6"  (1.676 m)    Body mass index is 31.07 kg/m. No results found.      No data to display           General: well developed, well nourished, seated, in no evident distress Head: head normocephalic and atraumatic.   Neck: supple with no carotid or supraclavicular bruits Cardiovascular: regular rate and rhythm, no murmurs Musculoskeletal: no deformity Skin:  no rash/petichiae Vascular:  Normal pulses all  extremities   Neurologic Exam Mental Status:  Awake and fully alert.  Fluent speech and language.  Oriented to place and time. Recent and remote memory intact. Attention span, concentration and fund of knowledge appropriate. Mood and affect appropriate.  Cranial Nerves: Fundoscopic exam reveals sharp disc margins. Pupils equal, briskly reactive to light. Extraocular movements full without nystagmus. Visual fields full to confrontation. Hearing intact. Facial sensation intact. Face, tongue, palate moves normally and symmetrically.  Motor: Normal bulk and tone. Normal strength in all tested extremity muscles with exception of left hip flexion 4+/5 (post back surgery)  Sensory.: intact to touch , pinprick , position and vibratory sensation. No abnormal sensory changes reported with exam  Coordination: Rapid alternating movements normal in all extremities. Finger-to-nose and heel-to-shin performed accurately bilaterally. Gait and Station: Arises from chair without difficulty. Stance is normal. Gait demonstrates normal stride length and balance with no assistive device. Tandem walk and heel toe unsteady.  Reflexes: 1+ and symmetric with exception of left patellar which was absent (post back surgery)    NIHSS  0 Modified Rankin  0    ASSESSMENT: Ebony [name] is a 82 y.o. year old female presented to the ER 10/08/2021 with left arm weakness . Vascular risk factors include HTN, HLD, CHD 3A.    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 Continue clopidogrel 75 mg daily and atorvastatin for secondary stroke prevention.  Discussed secondary stroke prevention measures and importance of close PCP follow up for aggressive stroke risk factor management. I have gone over the pathophysiology of stroke, warning signs and symptoms, risk factors and their management in some detail with instructions to go to the closest emergency room for symptoms of concern. HTN:  BP goal <130/90. Elevated, today. 142/91. Patient reports normal at home. Continue monitoring at home. Continue Diazide per PCP.  HLD: LDL goal <70. Recent LDL 35. Continue atorvastatin per PCP.  DMII: A1c goal<7.0. Recent A1c 6.5. Now in diabetic range. Continue close follow up with PCP. Monitor intake of carbohydrates.  Cardiac monitoring: loop recorder interrogation to rule out afib. Follow up with cardiology.  Obesity: continue healthy weight management habits with low carb diet and regular exercise.    Follow up in 6 months or call earlier if needed   CC:  [facility] provider: Dr. Pearlean Brownie PCP: [name], MD    I spent 45 minutes of face-to-face and non-face-to-face time with patient.  This included previsit chart review including review of recent hospitalization, lab review, study review, order entry, electronic health record documentation, patient education regarding recent stroke including etiology, secondary stroke prevention measures and importance of managing stroke risk factors, residual deficits and typical recovery time and answered all other questions to patient satisfaction   Shawnie Dapper, [facility]  [facility] [address] [place], [place] [address]  Phone [phone] Fax [phone] Note: This document was prepared with digital dictation and possible smart phrase technology. Any transcriptional errors that result from this process are unintentional.

## 2023-03-16 ENCOUNTER — Ambulatory Visit: Payer: Medicare PPO | Admitting: Family Medicine

## 2023-03-16 ENCOUNTER — Encounter: Payer: Self-pay | Admitting: Family Medicine

## 2023-03-16 VITALS — BP 142/91 | HR 96 | Ht 66.0 in | Wt 192.5 lb

## 2023-03-16 DIAGNOSIS — I639 Cerebral infarction, unspecified: Secondary | ICD-10-CM

## 2023-03-17 ENCOUNTER — Encounter: Payer: Self-pay | Admitting: Podiatry

## 2023-03-17 ENCOUNTER — Ambulatory Visit (INDEPENDENT_AMBULATORY_CARE_PROVIDER_SITE_OTHER): Payer: Medicare PPO | Admitting: Podiatry

## 2023-03-17 DIAGNOSIS — L600 Ingrowing nail: Secondary | ICD-10-CM

## 2023-03-17 DIAGNOSIS — Z9889 Other specified postprocedural states: Secondary | ICD-10-CM

## 2023-03-17 DIAGNOSIS — N950 Postmenopausal bleeding: Secondary | ICD-10-CM | POA: Insufficient documentation

## 2023-03-17 NOTE — Discharge Instructions (Addendum)
Kyphoplasty Post Procedure Discharge Instructions  May resume a regular diet and any medications that you routinely take (including pain medications). However, if you are taking Aspirin or an anticoagulant/blood thinner you will be told when you can resume taking these by the healthcare provider. No driving day of procedure. The day of your procedure take it easy. You may use an ice pack as needed to injection sites on back.  Ice to back 30 minutes on and 30 minutes off, as needed. May remove bandaids tomorrow after taking a shower. Replace daily with a clean bandaid until healed.  Do not lift anything heavier than a milk jug for 1-2 weeks or determined by your physician.  Follow up with your physician in 2 weeks.    Please contact our office at [phone] for the following symptoms or if you have any questions:  Fever greater than 100 degrees Increased swelling, pain, or redness at injection site. Increased back and/or leg pain New numbness or change in symptoms from before the procedure.    Thank you for visiting [facility].  May resume plavix immediately after procedure.

## 2023-03-17 NOTE — Progress Notes (Signed)
She presents today for follow-up of her matrixectomies.  She states that she tends soap exactly like she was supposed to the right 1 is still little bit sore.  She has been soaking in Betadine when she did soak.  She is also concerned about the second digit of her right foot becoming a hammertoe.  Objective: Vital signs stable oriented x 3.  Wounds appear to be healing well the fibular border of the hallux right does demonstrate some maceration and some fibrin deposition that should go on and resolve.  I see no signs of infection minimal erythema no cellulitis drainage or odor to either foot around the hallux.  Second digit however does demonstrate a flexible hammertoe deformity right she states that it is painful to walk on the tip of the toe.  Assessment: Well-healing surgical foot bilateral hallux.  Hammertoe deformity second right.  Plan: Discussed etiology pathology and surgical therapies at this point I recommended that we continue to soak but in Epsom salts and warm water very strong that will help dry those toes out cover during the day but leave open to bedtime.  Also when she returns from the beach in late September or October November she will notify us at which time we can get her flexor tenotomy performed to the second digit.

## 2023-03-19 ENCOUNTER — Inpatient Hospital Stay: Admission: RE | Admit: 2023-03-19 | Payer: Medicare PPO | Source: Ambulatory Visit

## 2023-03-19 DIAGNOSIS — M8008XA Age-related osteoporosis with current pathological fracture, vertebra(e), initial encounter for fracture: Secondary | ICD-10-CM | POA: Diagnosis not present

## 2023-03-19 DIAGNOSIS — M5459 Other low back pain: Secondary | ICD-10-CM

## 2023-03-19 DIAGNOSIS — S32020A Wedge compression fracture of second lumbar vertebra, initial encounter for closed fracture: Secondary | ICD-10-CM

## 2023-03-19 HISTORY — PX: IR KYPHO LUMBAR INC FX REDUCE BONE BX UNI/BIL CANNULATION INC/IMAGING: IMG5519

## 2023-03-19 MED ORDER — FENTANYL CITRATE PF 50 MCG/ML IJ SOSY: 25.0000 ug | PREFILLED_SYRINGE | INTRAMUSCULAR | Status: DC | PRN

## 2023-03-19 MED ORDER — FENTANYL CITRATE (PF) 100 MCG/2ML IJ SOLN
INTRAMUSCULAR | Status: DC | PRN
Start: 2023-03-19 — End: 2023-03-20
  Administered 2023-03-19: 25 ug via INTRAVENOUS
  Administered 2023-03-19: 50 ug via INTRAVENOUS
  Administered 2023-03-19: 25 ug via INTRAVENOUS

## 2023-03-19 MED ORDER — ACETAMINOPHEN 10 MG/ML IV SOLN
1000.0000 mg | Freq: Once | INTRAVENOUS | Status: AC
  Administered 2023-03-19: 1000 mg via INTRAVENOUS

## 2023-03-19 MED ORDER — MIDAZOLAM HCL 2 MG/2ML IJ SOLN: 1.0000 mg | INTRAMUSCULAR | Status: DC | PRN

## 2023-03-19 MED ORDER — SODIUM CHLORIDE 0.9 % IV SOLN: INTRAVENOUS | Status: DC

## 2023-03-19 MED ORDER — CEFAZOLIN SODIUM-DEXTROSE 2-4 GM/100ML-% IV SOLN
2.0000 g | INTRAVENOUS | Status: AC
  Administered 2023-03-19: 2 g via INTRAVENOUS

## 2023-03-19 MED ORDER — MIDAZOLAM HCL 2 MG/2ML IJ SOLN
INTRAMUSCULAR | Status: DC | PRN
Start: 2023-03-19 — End: 2023-03-20
  Administered 2023-03-19 (×3): .5 mg via INTRAVENOUS

## 2023-03-19 NOTE — Progress Notes (Signed)
Pt back in nursing recovery area. Pt remains drowsy from sedation from procedure but will respond when spoken to. Pt follows commands, talks in complete sentences and has no s/s of distress. Pt denies complaints at this time. Pt will remain in nursing station until discharge.Will continue to monitor and tx pt according to MD orders.

## 2023-03-20 NOTE — Progress Notes (Signed)
Carelink Summary Report / Loop Recorder 

## 2023-03-26 ENCOUNTER — Telehealth: Payer: Self-pay

## 2023-03-26 ENCOUNTER — Other Ambulatory Visit: Payer: Self-pay | Admitting: Interventional Radiology

## 2023-03-26 DIAGNOSIS — M5459 Other low back pain: Secondary | ICD-10-CM

## 2023-03-26 NOTE — Telephone Encounter (Signed)
Phone call to pt to follow up from her kyphoplasty on 03/19/23. Pt reports her pain is completely gone post procedure but is still having "a little soreness and muscle weakness". Pt reports she is able to move around a little better. Pt denies any signs of infection, redness at the site, draining or fever. Pt has no complaints at this time and will be scheduled for a telephone follow up with Dr. Archer Asa next week. Pt advised to call back if anything were to change or any concerns arise and we will arrange an in person appointment. Pt verbalized understanding.

## 2023-04-02 ENCOUNTER — Ambulatory Visit
Admission: RE | Admit: 2023-04-02 | Discharge: 2023-04-02 | Disposition: A | Discharge status: Home / Self Care | Payer: Medicare PPO | Source: Ambulatory Visit | Attending: Interventional Radiology | Admitting: Interventional Radiology

## 2023-04-02 DIAGNOSIS — M5459 Other low back pain: Secondary | ICD-10-CM

## 2023-04-02 DIAGNOSIS — M545 Low back pain, unspecified: Secondary | ICD-10-CM | POA: Diagnosis not present

## 2023-04-02 HISTORY — PX: IR RADIOLOGIST EVAL & MGMT: IMG5224

## 2023-04-02 NOTE — Progress Notes (Signed)
Chief Complaint: Patient was consulted remotely today (TeleHealth) for low back pain, L2 fracture at the request of Loxley Schmale K.    Referring Physician(s): Beane,Jeffrey   History of Present Illness: Ebony [name] is a 82 y.o. female with a history of osteoporotic fracture of L2 with severe back pain.  She was initially seen by my partner, Dr. Bryn Gulling.  I performed her L2 kyphoplasty on 03/24/23.   We spoke over the phone today for her 2 week follow up.  She is pleased to say her back and fracture pain are feeling much better.  Unfortunately, she continues to struggle with activities like shopping.  She feels her back muscles are so weak that they tire out and begin to spasm.   Past Medical History:  Diagnosis Date   Arthritis    Cancer (HCC)    BASAL CELL CA   Depression    GERD (gastroesophageal reflux disease)    TAKES TUMS PRN   Hypertension    Osteopenia    Stroke (HCC)    TIA 2004- PT IS ON PLAVIX    Past Surgical History:  Procedure Laterality Date   BUNIONECTOMY Right    CARDIAC CATHETERIZATION     CATARACT EXTRACTION Bilateral    IR KYPHO LUMBAR INC FX REDUCE BONE BX UNI/BIL CANNULATION INC/IMAGING  03/19/2023   IR RADIOLOGIST EVAL & MGMT  04/02/2023   KNEE ARTHROPLASTY Right 11/29/2015   Procedure: RIGHT TOTAL KNEE ARTHROPLASTY ;  Surgeon: Samson Frederic, MD;  Location: [facility];  Service: Orthopedics;  Laterality: Right;   KNEE ARTHROSCOPY Right 10/05/2013   Procedure: RIGHT KNEE ARTHROSCOPY WITH medial and lateral DEBRIDEMENT chrondroplasty;  Surgeon: Loanne Drilling, MD;  Location: [facility];  Service: Orthopedics;  Laterality: Right;   LOOP RECORDER INSERTION N/A 10/09/2021   Procedure: LOOP RECORDER INSERTION;  Surgeon: Marinus Maw, MD;  Location: [facility] INV[facility]IVE CV [facility];  Service: Cardiovascular;  Laterality: N/A;   LUMBAR LAMINECTOMY/DECOMPRESSION MICRODISCECTOMY N/A 09/12/2021   Procedure: Lumbar decompression Lumbar three - Lumbar four and lumbar four-five;   Surgeon: Jene Every, MD;  Location: [facility];  Service: Orthopedics;  Laterality: N/A;   MOHS SURGERY ON NOSE 01/2007, MOHS ON FACE 08/2012     TONSILLECTOMY      Allergies: Other, Codeine, Repatha [evolocumab], Statins, and Sulfa antibiotics  Medications: Prior to Admission medications   Medication Sig Start Date End Date Taking? Authorizing Provider  acetaminophen (TYLENOL) 500 MG tablet Take 1,000 mg by mouth every 6 (six) hours as needed for headache (pain).    [provider]  albuterol (VENTOLIN HFA) 108 (90 Base) MCG/ACT inhaler Inhale 1-2 puffs into the lungs as needed. 12/24/22   [provider]  allopurinol (ZYLOPRIM) 100 MG tablet Take 100 mg by mouth every morning. 11/23/18   [provider]  ALPRAZolam Prudy Feeler) 0.5 MG tablet Take 0.5 mg by mouth at bedtime as needed for anxiety or sleep.  07/19/14   [provider]  atorvastatin (LIPITOR) 10 MG tablet Take 10 mg by mouth See admin instructions. 10 mg once daily in the evening on Monday, Wednesday, Friday. 10/21/22   [provider]  citalopram (CELEXA) 20 MG tablet Take 20 mg by mouth every morning. 11/07/15   [provider]  clopidogrel (PLAVIX) 75 MG tablet Take 75 mg by mouth every morning.    [provider]  losartan (COZAAR) 25 MG tablet Take 1 tablet by mouth daily. 12/24/22   [provider]  NEOMYCIN-POLYMYXIN-HYDROCORTISONE (CORTISPORIN) 1 %  SOLN OTIC solution Apply 1-2 drops to toe BID after soaking 03/03/23   Hyatt, Max T, DPM  Study - LIBREXIA-STROKE - milvexian 25 mg or placebo tablet (PI-Sethi) Take 1 tablet by mouth 2 (two) times daily. For Investigational Use Only. Take with or without food at approximately the same time each day. Please contact [facility] for any questions or concerns regarding this medication. Patient not taking: Reported on 03/09/2023 12/16/22   Micki Riley, MD  triamterene-hydrochlorothiazide (DYAZIDE) 37.5-25 MG  capsule Take 1 capsule by mouth every morning. 11/07/15   [provider]  Vitamin D, Ergocalciferol, (DRISDOL) 1.25 MG (50000 UNIT) CAPS capsule Take 50,000 Units by mouth every Monday.    [provider]     Family History  Problem Relation Age of Onset   Breast cancer Sister     Social History   Socioeconomic History   Marital status: Single    Spouse name: Not on file   Number of children: Not on file   Years of education: Not on file   Highest education level: Not on file  Occupational History   Not on file  Tobacco Use   Smoking status: Former    Current packs/day: 0.00    Average packs/day: 1 pack/day for 10.0 years (10.0 ttl pk-yrs)    Types: Cigarettes    Start date: 10/03/1961    Quit date: 10/04/1971    Years since quitting: 51.5   Smokeless tobacco: Never  Vaping Use   Vaping status: Never Used  Substance and Sexual Activity   Alcohol use: Yes    Comment: OCCAS ALCOHOL   Drug use: No   Sexual activity: Not on file  Other Topics Concern   Not on file  Social History Narrative   Not on file   Social Determinants of Health   Financial Resource Strain: Not on file  Food Insecurity: No Food Insecurity (11/07/2021)   Hunger Vital Sign    Worried About Running Out of Food in the Last Year: Never true    Ran Out of Food in the Last Year: Never true  Transportation Needs: No Transportation Needs (11/07/2021)   PRAPARE - Administrator, Civil Service (Medical): No    Lack of Transportation (Non-Medical): No  Physical Activity: Not on file  Stress: Not on file  Social Connections: Not on file    Review of Systems  Review of Systems: A 12 point ROS discussed and pertinent positives are indicated in the HPI above.  All other systems are negative.  Advance Care Plan: The advanced care plan/surrogate decision maker was discussed at the time of visit and the patient did not wish to discuss or was not able to name a surrogate decision  maker or provide an advance care plan.    Physical Exam No direct physical exam was performed (except for noted visual exam findings with Video Visits).    Vital Signs: There were no vitals taken for this visit.  Imaging: IR Radiologist Eval & Mgmt  Result Date: 04/02/2023 EXAM: ESTABLISHED PATIENT OFFICE VISIT CHIEF COMPLAINT: SEE EPIC NOTE HISTORY OF PRESENT ILLNESS: SEE EPIC NOTE REVIEW OF SYSTEMS: SEE EPIC NOTE PHYSICAL EXAMINATION: SEE EPIC NOTE ASSESSMENT AND PLAN: SEE EPIC NOTE Electronically Signed   By: Malachy Moan M.D.   On: 04/02/2023 11:39   IR KYPHO LUMBAR INC FX REDUCE BONE BX UNI/BIL CANNULATION INC/IMAGING  Result Date: 03/19/2023 CLINICAL DATA:  82 year old female with highly symptomatic osteoporotic compression fracture of  L2. she presents for cement augmentation with balloon kyphoplasty. EXAM: FLUOROSCOPIC GUIDED KYPHOPLASTY OF THE L2 VERTEBRAL BODY COMPARISON:  None Available. MEDICATIONS: As antibiotic prophylaxis, 2 g Ancef was ordered pre-procedure and administered intravenously within 1 hour of incision. ANESTHESIA/SEDATION: Moderate (conscious) sedation was employed during this procedure. A total of Versed 2 mg and Fentanyl 100 mcg was administered intravenously. Moderate Sedation Time: 23 minutes. The patient's level of consciousness and vital signs were monitored continuously by radiology nursing throughout the procedure under my direct supervision. FLUOROSCOPY TIME:  Radiation exposure index: 72.9 mGy air kerma COMPLICATIONS: None immediate. PROCEDURE: The procedure, risks (including but not limited to bleeding, infection, organ damage), benefits, and alternatives were explained to the patient. Questions regarding the procedure were encouraged and answered. The patient understands and consents to the procedure. The patient has suffered a fracture of the L2. It is recommended that patients aged 24 years or older be evaluated for possible testing or treatment of  osteoporosis. A copy of this procedure report is sent to the patient's referring physician The patient was placed prone on the fluoroscopic table. The skin overlying the upper thoracic region was then prepped and draped in the usual sterile fashion. Maximal barrier sterile technique was utilized including caps, mask, sterile gowns, sterile gloves, sterile drape, hand hygiene and skin antiseptic. Intravenous Fentanyl and Versed were administered as conscious sedation during continuous cardiorespiratory monitoring by the radiology RN. The left pedicle at L2 was then infiltrated with 1% lidocaine followed by the advancement of a Kyphon trocar needle through the left pedicle into the posterior one-third of the vertebral body. Subsequently, the osteo drill was advanced to the anterior third of the vertebral body. The osteo drill was retracted. Through the working cannula, a Kyphon inflatable bone tamp 15 x 2.5 was advanced and positioned with the distal marker approximately 5 mm from the anterior aspect of the cortex. Appropriate positioning was confirmed on the AP projection. At this time, the balloon was expanded using contrast via a Kyphon inflation syringe device via micro tubing. In similar fashion, the right L2 pedicle was infiltrated with 1% lidocaine followed by the advancement of a second Kyphon trocar needle through the right pedicle into the posterior third of the vertebral body. Subsequently, the osteo drill was coaxially advanced to the anterior right third. The osteo drill was exchanged for a Kyphon inflatable bone tamp 15 x 2.5, advanced to the 5 mm of the anterior aspect of the cortex. The balloon was then expanded using contrast as above. Inflations were continued until there was near apposition with the superior end plate. At this time, methylmethacrylate mixture was reconstituted in the Kyphon bone mixing device system. This was then loaded into the delivery mechanism, attached to the cement delivery  system. The balloons were deflated and removed followed by the instillation of methylmethacrylate mixture with excellent filling in the AP and lateral projections. No extravasation was noted in the disk spaces or posteriorly into the spinal canal. No epidural venous contamination was seen. The working cannulae and the bone filler were then retrieved and removed. Hemostasis was achieved with manual compression. The patient tolerated the procedure well without immediate postprocedural complication. IMPRESSION: 1. Technically successful L2 vertebral body augmentation using balloon kyphoplasty. 2. Per CMS PQRS reporting requirements (PQRS Measure 24): Given the patient's age of greater than 50 and the fracture site (hip, distal radius, or spine), the patient should be tested for osteoporosis using DXA, and the appropriate treatment considered based on the DXA results. Electronically  Signed   By: Malachy Moan M.D.   On: 03/19/2023 12:21   CUP PACEART REMOTE DEVICE CHECK  Result Date: 03/09/2023 ILR summary report received. Battery status OK. Normal device function. No new symptom, tachy, brady, or pause episodes. No new AF episodes. Monthly summary reports and ROV/PRN. [facility], [facility].   Labs:  CBC: Recent Labs    11/17/22 2147 11/17/22 2149 03/09/23 1616  WBC 6.0  --  9.6  HGB 14.4 14.3 13.9  HCT 40.7 42.0 40.2  PLT 265  --  281    COAGS: Recent Labs    11/17/22 2147 03/09/23 1616  INR 1.1 1.0  APTT 28  --     BMP: Recent Labs    11/17/22 2147 11/17/22 2149 11/18/22 0732 01/26/23 1100 03/09/23 1616  NA 137 139 137 144 137  K 2.8* 2.8* 3.5 4.0 4.6  CL 99 102 103 101 102  CO2 21*  --  25 27 27   GLUCOSE 203* 201* 139* 116* 169*  BUN 21 22 20 19 16   CALCIUM 9.6  --  8.9 9.8 9.6  CREATININE 1.13* 1.00 1.10* 1.15* 0.70  GFRNONAA 49*  --  50*  --   --     LIVER FUNCTION TESTS: Recent Labs    11/17/22 2147 03/09/23 1616  BILITOT 0.4 0.5  AST 24 22  ALT 18 20  ALKPHOS 51  --    PROT 6.4* 6.9  ALBUMIN 3.5  --     TUMOR MARKERS: No results for input(s): "AFPTM", "CEA", "CA199", "CHROMGRNA" in the last 8760 hours.  Assessment and Plan:  Very pleasant 81 year-old female doing well 2 weeks post L2 cement augmentation with balloon kyphoplasty.  While her fracture pain is improved/resolved, she continued to have weakness and muscle spasms in her back which are limiting her activities.   She is interested in undergoing physical therapy.   1.) I will have my office make a referral for 6 weeks of physical therapy. Indication: Persistent weakness and spasms in low back after successful L2 kyphoplasty.     Electronically Signed: Sterling Big 04/02/2023, 11:41 AM   I spent a total of 10 Minutes in remote  clinical consultation, greater than 50% of which was counseling/coordinating care for L2 kyphoplasty, low back pain.    Visit type: Audio only (telephone). Audio (no video) only due to patient's lack of internet/smartphone capability. Alternative for in-person consultation at [facility], [address], [place], [place]. This visit type was conducted due to national recommendations for restrictions regarding the COVID-19 Pandemic (e.g. social distancing).  This format is felt to be most appropriate for this patient at this time.  All issues noted in this document were discussed and addressed.

## 2023-04-13 ENCOUNTER — Ambulatory Visit (INDEPENDENT_AMBULATORY_CARE_PROVIDER_SITE_OTHER): Payer: Medicare PPO

## 2023-04-13 DIAGNOSIS — I639 Cerebral infarction, unspecified: Secondary | ICD-10-CM | POA: Diagnosis not present

## 2023-04-15 LAB — CUP PACEART REMOTE DEVICE CHECK
Date Time Interrogation Session: 20240920230746
Implantable Pulse Generator Implant Date: 20230322

## 2023-04-18 ENCOUNTER — Other Ambulatory Visit: Payer: Self-pay | Admitting: Internal Medicine

## 2023-04-18 DIAGNOSIS — S32020A Wedge compression fracture of second lumbar vertebra, initial encounter for closed fracture: Secondary | ICD-10-CM

## 2023-04-28 NOTE — Progress Notes (Signed)
Carelink Summary Report / Loop Recorder 

## 2023-05-04 DIAGNOSIS — N183 Chronic kidney disease, stage 3 unspecified: Secondary | ICD-10-CM | POA: Diagnosis not present

## 2023-05-04 DIAGNOSIS — E785 Hyperlipidemia, unspecified: Secondary | ICD-10-CM | POA: Diagnosis not present

## 2023-05-04 DIAGNOSIS — M109 Gout, unspecified: Secondary | ICD-10-CM | POA: Diagnosis not present

## 2023-05-04 DIAGNOSIS — I10 Essential (primary) hypertension: Secondary | ICD-10-CM | POA: Diagnosis not present

## 2023-05-04 DIAGNOSIS — E559 Vitamin D deficiency, unspecified: Secondary | ICD-10-CM | POA: Diagnosis not present

## 2023-05-11 DIAGNOSIS — R7301 Impaired fasting glucose: Secondary | ICD-10-CM | POA: Diagnosis not present

## 2023-05-11 DIAGNOSIS — N1831 Chronic kidney disease, stage 3a: Secondary | ICD-10-CM | POA: Diagnosis not present

## 2023-05-11 DIAGNOSIS — I6523 Occlusion and stenosis of bilateral carotid arteries: Secondary | ICD-10-CM | POA: Diagnosis not present

## 2023-05-11 DIAGNOSIS — Z0000 Encounter for general adult medical examination without abnormal findings: Secondary | ICD-10-CM | POA: Diagnosis not present

## 2023-05-11 DIAGNOSIS — G609 Hereditary and idiopathic neuropathy, unspecified: Secondary | ICD-10-CM | POA: Diagnosis not present

## 2023-05-11 DIAGNOSIS — Z23 Encounter for immunization: Secondary | ICD-10-CM | POA: Diagnosis not present

## 2023-05-11 DIAGNOSIS — D692 Other nonthrombocytopenic purpura: Secondary | ICD-10-CM | POA: Diagnosis not present

## 2023-05-11 DIAGNOSIS — L570 Actinic keratosis: Secondary | ICD-10-CM | POA: Diagnosis not present

## 2023-05-11 DIAGNOSIS — S32020S Wedge compression fracture of second lumbar vertebra, sequela: Secondary | ICD-10-CM | POA: Diagnosis not present

## 2023-05-11 DIAGNOSIS — Z1339 Encounter for screening examination for other mental health and behavioral disorders: Secondary | ICD-10-CM | POA: Diagnosis not present

## 2023-05-11 DIAGNOSIS — G720 Drug-induced myopathy: Secondary | ICD-10-CM | POA: Diagnosis not present

## 2023-05-11 DIAGNOSIS — Z85828 Personal history of other malignant neoplasm of skin: Secondary | ICD-10-CM | POA: Diagnosis not present

## 2023-05-11 DIAGNOSIS — F330 Major depressive disorder, recurrent, mild: Secondary | ICD-10-CM | POA: Diagnosis not present

## 2023-05-11 DIAGNOSIS — I10 Essential (primary) hypertension: Secondary | ICD-10-CM | POA: Diagnosis not present

## 2023-05-11 DIAGNOSIS — Z85820 Personal history of malignant melanoma of skin: Secondary | ICD-10-CM | POA: Diagnosis not present

## 2023-05-11 DIAGNOSIS — E785 Hyperlipidemia, unspecified: Secondary | ICD-10-CM | POA: Diagnosis not present

## 2023-05-11 DIAGNOSIS — Z1331 Encounter for screening for depression: Secondary | ICD-10-CM | POA: Diagnosis not present

## 2023-05-18 ENCOUNTER — Ambulatory Visit (INDEPENDENT_AMBULATORY_CARE_PROVIDER_SITE_OTHER): Payer: Medicare PPO

## 2023-05-18 DIAGNOSIS — I639 Cerebral infarction, unspecified: Secondary | ICD-10-CM | POA: Diagnosis not present

## 2023-05-19 LAB — CUP PACEART REMOTE DEVICE CHECK
Date Time Interrogation Session: 20241027231304
Implantable Pulse Generator Implant Date: 20230322

## 2023-06-08 NOTE — Progress Notes (Signed)
Carelink Summary Report / Loop Recorder 

## 2023-06-21 LAB — CUP PACEART REMOTE DEVICE CHECK
Date Time Interrogation Session: 20241129230254
Implantable Pulse Generator Implant Date: 20230322

## 2023-06-22 ENCOUNTER — Ambulatory Visit: Payer: Medicare PPO

## 2023-06-22 DIAGNOSIS — I639 Cerebral infarction, unspecified: Secondary | ICD-10-CM | POA: Diagnosis not present

## 2023-06-24 DIAGNOSIS — E6609 Other obesity due to excess calories: Secondary | ICD-10-CM | POA: Diagnosis not present

## 2023-06-24 DIAGNOSIS — H900 Conductive hearing loss, bilateral: Secondary | ICD-10-CM | POA: Diagnosis not present

## 2023-06-24 DIAGNOSIS — H6123 Impacted cerumen, bilateral: Secondary | ICD-10-CM | POA: Diagnosis not present

## 2023-07-06 DIAGNOSIS — Z85828 Personal history of other malignant neoplasm of skin: Secondary | ICD-10-CM | POA: Diagnosis not present

## 2023-07-06 DIAGNOSIS — L570 Actinic keratosis: Secondary | ICD-10-CM | POA: Diagnosis not present

## 2023-07-06 DIAGNOSIS — L249 Irritant contact dermatitis, unspecified cause: Secondary | ICD-10-CM | POA: Diagnosis not present

## 2023-07-21 DIAGNOSIS — M545 Low back pain, unspecified: Secondary | ICD-10-CM | POA: Diagnosis not present

## 2023-07-27 ENCOUNTER — Ambulatory Visit (INDEPENDENT_AMBULATORY_CARE_PROVIDER_SITE_OTHER): Payer: Medicare PPO

## 2023-07-27 DIAGNOSIS — I639 Cerebral infarction, unspecified: Secondary | ICD-10-CM | POA: Diagnosis not present

## 2023-07-28 DIAGNOSIS — M545 Low back pain, unspecified: Secondary | ICD-10-CM | POA: Diagnosis not present

## 2023-07-29 LAB — CUP PACEART REMOTE DEVICE CHECK
Date Time Interrogation Session: 20250105231157
Implantable Pulse Generator Implant Date: 20230322

## 2023-08-03 DIAGNOSIS — M545 Low back pain, unspecified: Secondary | ICD-10-CM | POA: Diagnosis not present

## 2023-08-12 DIAGNOSIS — M545 Low back pain, unspecified: Secondary | ICD-10-CM | POA: Diagnosis not present

## 2023-08-18 DIAGNOSIS — M1712 Unilateral primary osteoarthritis, left knee: Secondary | ICD-10-CM | POA: Diagnosis not present

## 2023-08-19 DIAGNOSIS — M545 Low back pain, unspecified: Secondary | ICD-10-CM | POA: Diagnosis not present

## 2023-08-24 DIAGNOSIS — L821 Other seborrheic keratosis: Secondary | ICD-10-CM | POA: Diagnosis not present

## 2023-08-24 DIAGNOSIS — L570 Actinic keratosis: Secondary | ICD-10-CM | POA: Diagnosis not present

## 2023-08-24 DIAGNOSIS — L245 Irritant contact dermatitis due to other chemical products: Secondary | ICD-10-CM | POA: Diagnosis not present

## 2023-08-24 DIAGNOSIS — Z85828 Personal history of other malignant neoplasm of skin: Secondary | ICD-10-CM | POA: Diagnosis not present

## 2023-08-24 DIAGNOSIS — D1801 Hemangioma of skin and subcutaneous tissue: Secondary | ICD-10-CM | POA: Diagnosis not present

## 2023-08-24 DIAGNOSIS — C4441 Basal cell carcinoma of skin of scalp and neck: Secondary | ICD-10-CM | POA: Diagnosis not present

## 2023-08-24 DIAGNOSIS — L309 Dermatitis, unspecified: Secondary | ICD-10-CM | POA: Diagnosis not present

## 2023-08-25 ENCOUNTER — Telehealth: Payer: Self-pay | Admitting: Family Medicine

## 2023-08-25 DIAGNOSIS — M1712 Unilateral primary osteoarthritis, left knee: Secondary | ICD-10-CM | POA: Diagnosis not present

## 2023-08-25 NOTE — Telephone Encounter (Signed)
 Noted

## 2023-08-25 NOTE — Telephone Encounter (Signed)
FYI: Pt said, anus is very itchy., when urinate have a little soft bowel movement with it  Anus is itchy even when not urinating. Pt said discuss March 12 appt.  Advised patient to see PCP.

## 2023-08-31 ENCOUNTER — Ambulatory Visit (INDEPENDENT_AMBULATORY_CARE_PROVIDER_SITE_OTHER): Payer: Medicare PPO

## 2023-08-31 DIAGNOSIS — I639 Cerebral infarction, unspecified: Secondary | ICD-10-CM | POA: Diagnosis not present

## 2023-08-31 DIAGNOSIS — M1712 Unilateral primary osteoarthritis, left knee: Secondary | ICD-10-CM | POA: Diagnosis not present

## 2023-08-31 LAB — CUP PACEART REMOTE DEVICE CHECK
Date Time Interrogation Session: 20250209231408
Implantable Pulse Generator Implant Date: 20230322

## 2023-09-07 ENCOUNTER — Encounter: Payer: Self-pay | Admitting: Internal Medicine

## 2023-09-07 NOTE — Progress Notes (Signed)
 [facility] Summary Report / Loop Recorder

## 2023-09-07 NOTE — Addendum Note (Signed)
Addended by: Geralyn Flash D on: 09/07/2023 04:56 PM   Modules accepted: Orders

## 2023-09-15 DIAGNOSIS — M542 Cervicalgia: Secondary | ICD-10-CM | POA: Diagnosis not present

## 2023-09-15 DIAGNOSIS — M25511 Pain in right shoulder: Secondary | ICD-10-CM | POA: Diagnosis not present

## 2023-09-28 ENCOUNTER — Telehealth: Payer: Self-pay

## 2023-09-28 NOTE — Telephone Encounter (Signed)
 ILR @ RRT   ILR reached RRT:  Patient called, discussed options to leave device in or explanted.  Patient would like to **  Marked "I" in Paceart: Yes Enter note in Paceart: Yes Canceled future remotes: Yes Discontinued from website: Yes Entered in Speciality Comments: Yes  Advised if further questions arise to please call the device clinic at [phone].  LMTCB

## 2023-09-29 NOTE — Telephone Encounter (Signed)
 ILR @ RRT   ILR reached RRT: 09/19/2023 Patient called, discussed options to leave device in or explanted.  Patient would like to leave in place.   Marked "I" in Paceart: YES Enter note in Paceart: YES Canceled future remotes: YES  Discontinued from website: YES Entered in [place] Comments: YES  Advised if further questions arise to please call the device clinic at [phone].

## 2023-09-29 NOTE — Progress Notes (Unsigned)
 [facility] [address] [place]. [place] [address] [phone]       HOSPITAL FOLLOW UP NOTE  Ms. WETONA [name] Date of Birth:  Oct 06, 1940 Medical Record Number:  [id number]   Reason for Referral:  hospital stroke follow up    SUBJECTIVE:   CHIEF COMPLAINT:  No chief complaint on file.   HPI:   Ebony [name] is a 83 y.o. who  has a past medical history of Arthritis, Cancer (HCC), Depression, GERD (gastroesophageal reflux disease), Hypertension, Osteopenia, and Stroke (HCC).  Patient presented on 10/08/2021 with left sided arm weakness, numbness and tingling. She woke that morning and able to take a shower. While getting dressed she had difficulty using left arm. Symptoms seemed to improve until later in the day when she had trouble holding her drink and hold cards with left hand while at a friends home. 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 LDL is at goal on Repatha, HbA1c 5.9%. Aspirin 81mg  was added to patient's PTA plavix 75mg . PT recommend outpatient therapy as patient's deficit is mild. OT no recommendations for outpatient. Due to concern for cardioembolic source not seen on transthoracic echocardiogram, a loop recorder was implanted prior to discharge.". Personally reviewed hospitalization pertinent progress notes, lab work and imaging.  Evaluated by Dr Roda Shutters.   Since being home, she feels that she is doing fine. She continues to note numbness of left 4th and 5th digits. Numbness if not limiting. She did not work with PT/OT outpatient. She does not feel she needs to. She continues to do exercises at home from inpatient PT/OT. She will occasionally drop something she is holding with the left hand. She has discontinued asa and now continued Plavix only. She has appt with Dr Antoine Poche on 5/22. She was seen by PCP the beginning of April. She has a long standing history of  vertigo. She has more symptoms over the past two days. Symptoms usually resolve with rest. She is walking about a mile 1-3 times a week.   UPDATE 03/16/2023 Chiquetta returns for follow up. She was initially seen by me 05/2022 for right parietal cortex infarct and small vessel ischemia and a small remote left cerebellar infarct. She felt she was mostly back to baseline. She was admitted 11/17/2022 following an event where she started acting strangely and had difficulty understanding her family. She was laughing inappropriately and eating with her fingers. EMS called and she was mostly non verbal. Speech slowly improved but was clearly still aphasic in ER. She was treated with TNK and symptoms resolved. Speech returned to baseline. Asa added for three weeks then Plavix alone. Previously on Plavix. LDL 55 on Repatha. A1C 6.5. She was enrolled into [place] stroke trial. No recommendations from PT/OT eval. She was discharged home 11/19/2022.   Since discharge, she reports that she is doing well. She denies any residual symptoms. BP has been normal at home. Usually around 130/80 on Dyazide. She continues Plavix. No unusual bleeding. She does have some bruising but feels she is due to taking atorvastatin. Dr Link Snuffer started her on atorvastatin, recently. She is taking M,W, F. She is tolerating well so far. She discontinued Repatha. She reports itching with injections. Tolerating Plavix. No unusual bleeding or bruising. She is monitored through cardiology. No events noted on loop recorder thus far. She discontinued [place] trial. She reports that her grandson lives with her. She has a friend  that also lives with her. She continues to drive without difficulty. She was walking but it has been too hot. She plans to resume exercise at gym after her kyphoplasty 03/19/2023.   UPDATE 09/29/2023 ALL: Ebony [name] returns for follow up for CVA. She was initially seen by me 05/2022 for right parietal cortex infarct and small vessel ischemia  and a small remote left cerebellar infarct then again in follow up 02/2023 and was doing well.   She continues atorvastatin M, W and F. She is tolerating it well. BP is    PERTINENT IMAGING/LABS  Code Stroke CT head No acute abnormality. ASPECTS 10.    CTA head & neck No LVO MRI no acute intracranial abnormality 2D Echo EF 60-65% Loop recorder interrogation no A-fib   A1C Lab Results  Component Value Date   HGBA1C 6.5 (H) 11/17/2022    Lipid Panel     Component Value Date/Time   CHOL 139 11/17/2022 2344   TRIG 91 11/17/2022 2344   HDL 66 11/17/2022 2344   CHOLHDL 2.1 11/17/2022 2344   VLDL 18 11/17/2022 2344   LDLCALC 55 11/17/2022 2344      ROS:   14 system review of systems performed and negative with exception of those listed in HPI  PMH:  Past Medical History:  Diagnosis Date   Arthritis    Cancer (HCC)    BASAL CELL CA   Depression    GERD (gastroesophageal reflux disease)    TAKES TUMS PRN   Hypertension    Osteopenia    Stroke (HCC)    TIA 2004- PT IS ON PLAVIX    PSH:  Past Surgical History:  Procedure Laterality Date   BUNIONECTOMY Right    CARDIAC CATHETERIZATION     CATARACT EXTRACTION Bilateral    IR KYPHO LUMBAR INC FX REDUCE BONE BX UNI/BIL CANNULATION INC/IMAGING  03/19/2023   IR RADIOLOGIST EVAL & MGMT  04/02/2023   KNEE ARTHROPLASTY Right 11/29/2015   Procedure: RIGHT TOTAL KNEE ARTHROPLASTY ;  Surgeon: Samson Frederic, MD;  Location: [facility]S;  Service: Orthopedics;  Laterality: Right;   KNEE ARTHROSCOPY Right 10/05/2013   Procedure: RIGHT KNEE ARTHROSCOPY WITH medial and lateral DEBRIDEMENT chrondroplasty;  Surgeon: Loanne Drilling, MD;  Location: [facility];  Service: Orthopedics;  Laterality: Right;   LOOP RECORDER INSERTION N/A 10/09/2021   Procedure: LOOP RECORDER INSERTION;  Surgeon: Marinus Maw, MD;  Location: [facility] INVASIVE CV LAB;  Service: Cardiovascular;  Laterality: N/A;   LUMBAR LAMINECTOMY/DECOMPRESSION MICRODISCECTOMY N/A  09/12/2021   Procedure: Lumbar decompression Lumbar three - Lumbar four and lumbar four-five;  Surgeon: Jene Every, MD;  Location: [facility] OR;  Service: Orthopedics;  Laterality: N/A;   MOHS SURGERY ON NOSE 01/2007, MOHS ON FACE 08/2012     TONSILLECTOMY      Social History:  Social History   Socioeconomic History   Marital status: Single    Spouse name: Not on file   Number of children: Not on file   Years of education: Not on file   Highest education level: Not on file  Occupational History   Not on file  Tobacco Use   Smoking status: Former    Current packs/day: 0.00    Average packs/day: 1 pack/day for 10.0 years (10.0 ttl pk-yrs)    Types: Cigarettes    Start date: 10/03/1961    Quit date: 10/04/1971    Years since quitting: 52.0   Smokeless tobacco: Never  Vaping Use   Vaping status: Never Used  Substance and Sexual Activity   Alcohol use: Yes    Comment: OCCAS ALCOHOL   Drug use: No   Sexual activity: Not on file  Other Topics Concern   Not on file  Social History Narrative   Not on file   Social Drivers of Health   Financial Resource Strain: Not on file  Food Insecurity: No Food Insecurity (11/07/2021)   Hunger Vital Sign    Worried About Running Out of Food in the Last Year: Never true    Ran Out of Food in the Last Year: Never true  Transportation Needs: No Transportation Needs (11/07/2021)   PRAPARE - Administrator, Civil Service (Medical): No    Lack of Transportation (Non-Medical): No  Physical Activity: Not on file  Stress: Not on file  Social Connections: Not on file  Intimate Partner Violence: Not on file    Family History:  Family History  Problem Relation Age of Onset   Breast cancer Sister     Medications:   Current Outpatient Medications on File Prior to Visit  Medication Sig Dispense Refill   acetaminophen (TYLENOL) 500 MG tablet Take 1,000 mg by mouth every 6 (six) hours as needed for headache (pain).     albuterol (VENTOLIN  HFA) 108 (90 Base) MCG/ACT inhaler Inhale 1-2 puffs into the lungs as needed.     allopurinol (ZYLOPRIM) 100 MG tablet Take 100 mg by mouth every morning.     ALPRAZolam (XANAX) 0.5 MG tablet Take 0.5 mg by mouth at bedtime as needed for anxiety or sleep.   0   atorvastatin (LIPITOR) 10 MG tablet Take 10 mg by mouth See admin instructions. 10 mg once daily in the evening on Monday, Wednesday, Friday.     citalopram (CELEXA) 20 MG tablet Take 20 mg by mouth every morning.  1   clopidogrel (PLAVIX) 75 MG tablet Take 75 mg by mouth every morning.     losartan (COZAAR) 25 MG tablet Take 1 tablet by mouth daily.     NEOMYCIN-POLYMYXIN-HYDROCORTISONE (CORTISPORIN) 1 % SOLN OTIC solution Apply 1-2 drops to toe BID after soaking 10 mL 1   Study - LIBREXIA-STROKE - milvexian 25 mg or placebo tablet (PI-Sethi) Take 1 tablet by mouth 2 (two) times daily. For Investigational Use Only. Take with or without food at approximately the same time each day. Please contact [facility] for any questions or concerns regarding this medication. (Patient not taking: Reported on 03/09/2023) 210 tablet 0   triamterene-hydrochlorothiazide (DYAZIDE) 37.5-25 MG capsule Take 1 capsule by mouth every morning.  6   Vitamin D, Ergocalciferol, (DRISDOL) 1.25 MG (50000 UNIT) CAPS capsule Take 50,000 Units by mouth every Monday.     No current facility-administered medications on file prior to visit.    Allergies:   Allergies  Allergen Reactions   Other Anaphylaxis    Unknown muscle relaxer   Codeine Nausea And Vomiting   Repatha [Evolocumab] Itching   Statins Other (See Comments)    Myalgias  Pt ok with taking atorvastatin 10mg  M-W-F   Sulfa Antibiotics Other (See Comments)    Red streaks on nose- ?  Cellulitis       OBJECTIVE:  Physical Exam  There were no vitals filed for this visit.   There is no height or weight on file to calculate BMI. No results found.      No data to display            General: well developed,  well nourished, seated, in no evident distress Head: head normocephalic and atraumatic.   Neck: supple with no carotid or supraclavicular bruits Cardiovascular: regular rate and rhythm, no murmurs Musculoskeletal: no deformity Skin:  no rash/petichiae Vascular:  Normal pulses all extremities   Neurologic Exam Mental Status: Awake and fully alert.  Fluent speech and language.  Oriented to place and time. Recent and remote memory intact. Attention span, concentration and fund of knowledge appropriate. Mood and affect appropriate.  Cranial Nerves: Fundoscopic exam reveals sharp disc margins. Pupils equal, briskly reactive to light. Extraocular movements full without nystagmus. Visual fields full to confrontation. Hearing intact. Facial sensation intact. Face, tongue, palate moves normally and symmetrically.  Motor: Normal bulk and tone. Normal strength in all tested extremity muscles with exception of left hip flexion 4+/5 (post back surgery)  Sensory.: intact to touch , pinprick , position and vibratory sensation. No abnormal sensory changes reported with exam  Coordination: Rapid alternating movements normal in all extremities. Finger-to-nose and heel-to-shin performed accurately bilaterally. Gait and Station: Arises from chair without difficulty. Stance is normal. Gait demonstrates normal stride length and balance with no assistive device. Tandem walk and heel toe unsteady.  Reflexes: 1+ and symmetric with exception of left patellar which was absent (post back surgery)    NIHSS  0 Modified Rankin  0    ASSESSMENT: Ebony [name] is a 83 y.o. year old female presented to the ER 10/08/2021 with left arm weakness . Vascular risk factors include HTN, HLD, CHD 3A.    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 Continue clopidogrel 75 mg daily and atorvastatin for secondary stroke prevention.  Discussed secondary  stroke prevention measures and importance of close PCP follow up for aggressive stroke risk factor management. I have gone over the pathophysiology of stroke, warning signs and symptoms, risk factors and their management in some detail with instructions to go to the closest emergency room for symptoms of concern. HTN: BP goal <130/90. Elevated, today. 142/91. Patient reports normal at home. Continue monitoring at home. Continue Diazide per PCP.  HLD: LDL goal <70. Recent LDL 35. Continue atorvastatin per PCP.  DMII: A1c goal<7.0. Recent A1c 6.5. Now in diabetic range. Continue close follow up with PCP. Monitor intake of carbohydrates.  Cardiac monitoring: loop recorder interrogation to rule out afib. Follow up with cardiology.  Obesity: continue healthy weight management habits with low carb diet and regular exercise.    Follow up in 6 months or call earlier if needed   CC:  [facility] provider: Dr. Pearlean Brownie PCP: [name], MD    I spent 45 minutes of face-to-face and non-face-to-face time with patient.  This included previsit chart review including review of recent hospitalization, lab review, study review, order entry, electronic health record documentation, patient education regarding recent stroke including etiology, secondary stroke prevention measures and importance of managing stroke risk factors, residual deficits and typical recovery time and answered all other questions to patient satisfaction   Shawnie Dapper, [facility]  [facility] [address] [place], [place] [address]  Phone [phone] Fax [phone] Note: This document was prepared with digital dictation and possible smart phrase technology. Any transcriptional errors that result from this process are unintentional.

## 2023-09-29 NOTE — Patient Instructions (Incomplete)

## 2023-09-30 ENCOUNTER — Encounter: Payer: Self-pay | Admitting: Family Medicine

## 2023-09-30 ENCOUNTER — Ambulatory Visit: Payer: Medicare PPO | Admitting: Family Medicine

## 2023-09-30 VITALS — BP 131/80 | HR 86 | Resp 15 | Ht 66.0 in

## 2023-09-30 DIAGNOSIS — I63411 Cerebral infarction due to embolism of right middle cerebral artery: Secondary | ICD-10-CM

## 2023-10-05 ENCOUNTER — Ambulatory Visit: Payer: Medicare PPO

## 2023-10-12 NOTE — Progress Notes (Signed)
 [facility] Summary Report / Loop Recorder

## 2023-10-20 ENCOUNTER — Encounter: Payer: Self-pay | Admitting: Podiatry

## 2023-10-20 ENCOUNTER — Ambulatory Visit: Admitting: Podiatry

## 2023-10-20 DIAGNOSIS — M62471 Contracture of muscle, right ankle and foot: Secondary | ICD-10-CM

## 2023-10-20 DIAGNOSIS — M24576 Contracture, unspecified foot: Secondary | ICD-10-CM

## 2023-10-20 NOTE — Patient Instructions (Signed)
 Leave bandage in place and dry for 4 days, then remove. You may wash foot normally after removal of bandage. DO NOT SOAK FOOT! Dry completely afterwards and may use a bandaid over incision if needed. We will follow up with you in 1 weeks for recheck.

## 2023-10-20 NOTE — Progress Notes (Signed)
 She presents today for chief concern of pain to the right foot with the toe turning in.  This is her second metatarsal phalangeal joint.  Objective: Vital signs are stable she is alert and oriented x 3.  Pulses are palpable.  She has flexor contraction at the level of the PIPJ second digit right foot.  There is some residual rigidity involved that is keeping the toe from lying flat.  Pulses are palpable no risk of peripheral vascular disease.  Assessment: Hammertoe deformity second right.  Plan: After local anesthetic was administered close the dorsum of the foot and into the intermetatarsal spaces I was able to perform after sterile Betadine skin prep a flexor tendon release just distal to the PIPJ of that toe.  She tolerated the procedure well without complications's sterile water was foot utilized to flush and then we closed with Dermabond.  She also utilized a Automotive engineer.  I will follow-up with her in a couple of weeks questions or concerns and to recheck this toe.

## 2023-10-23 ENCOUNTER — Telehealth: Payer: Self-pay | Admitting: Podiatry

## 2023-10-23 NOTE — Telephone Encounter (Signed)
 Pt had a procedure done 4/1. She wants to know how do she need to be the boot and keep the bandages on. Please call back to advise. TY

## 2023-10-26 NOTE — Telephone Encounter (Signed)
 Called Pt and gave message. She thank you and was happy to be out of the boot.

## 2023-11-02 DIAGNOSIS — L309 Dermatitis, unspecified: Secondary | ICD-10-CM | POA: Diagnosis not present

## 2023-11-02 DIAGNOSIS — Z85828 Personal history of other malignant neoplasm of skin: Secondary | ICD-10-CM | POA: Diagnosis not present

## 2023-11-02 DIAGNOSIS — L245 Irritant contact dermatitis due to other chemical products: Secondary | ICD-10-CM | POA: Diagnosis not present

## 2023-11-02 DIAGNOSIS — L820 Inflamed seborrheic keratosis: Secondary | ICD-10-CM | POA: Diagnosis not present

## 2023-11-09 ENCOUNTER — Ambulatory Visit: Payer: Medicare PPO

## 2023-11-09 DIAGNOSIS — I129 Hypertensive chronic kidney disease with stage 1 through stage 4 chronic kidney disease, or unspecified chronic kidney disease: Secondary | ICD-10-CM | POA: Diagnosis not present

## 2023-11-09 DIAGNOSIS — F330 Major depressive disorder, recurrent, mild: Secondary | ICD-10-CM | POA: Diagnosis not present

## 2023-11-09 DIAGNOSIS — E559 Vitamin D deficiency, unspecified: Secondary | ICD-10-CM | POA: Diagnosis not present

## 2023-11-09 DIAGNOSIS — M6530 Trigger finger, unspecified finger: Secondary | ICD-10-CM | POA: Diagnosis not present

## 2023-11-09 DIAGNOSIS — S32020S Wedge compression fracture of second lumbar vertebra, sequela: Secondary | ICD-10-CM | POA: Diagnosis not present

## 2023-11-09 DIAGNOSIS — N1831 Chronic kidney disease, stage 3a: Secondary | ICD-10-CM | POA: Diagnosis not present

## 2023-11-09 DIAGNOSIS — R7301 Impaired fasting glucose: Secondary | ICD-10-CM | POA: Diagnosis not present

## 2023-11-09 DIAGNOSIS — F419 Anxiety disorder, unspecified: Secondary | ICD-10-CM | POA: Diagnosis not present

## 2023-11-09 DIAGNOSIS — R350 Frequency of micturition: Secondary | ICD-10-CM | POA: Diagnosis not present

## 2023-11-10 ENCOUNTER — Encounter: Payer: Self-pay | Admitting: Podiatry

## 2023-11-10 ENCOUNTER — Ambulatory Visit (INDEPENDENT_AMBULATORY_CARE_PROVIDER_SITE_OTHER): Admitting: Podiatry

## 2023-11-10 DIAGNOSIS — M24576 Contracture, unspecified foot: Secondary | ICD-10-CM

## 2023-11-11 NOTE — Progress Notes (Signed)
 She presents today for follow-up of her tenotomy.  She is very happy with the tenotomy of the right foot second toe.  Denies fever chills nausea vomiting muscle aches pains.  Objective: States that she is doing quite well with this no problems whatsoever really likes the way that it looks.  Assessment: Well-healing surgical toe.  Plan: Follow-up on as-needed basis.

## 2023-11-17 DIAGNOSIS — Z780 Asymptomatic menopausal state: Secondary | ICD-10-CM | POA: Diagnosis not present

## 2023-11-20 DIAGNOSIS — H1045 Other chronic allergic conjunctivitis: Secondary | ICD-10-CM | POA: Diagnosis not present

## 2023-11-20 DIAGNOSIS — J300 Vasomotor rhinitis: Secondary | ICD-10-CM | POA: Diagnosis not present

## 2023-11-20 DIAGNOSIS — R21 Rash and other nonspecific skin eruption: Secondary | ICD-10-CM | POA: Diagnosis not present

## 2023-11-23 ENCOUNTER — Telehealth: Payer: Self-pay | Admitting: Cardiology

## 2023-11-23 NOTE — Telephone Encounter (Signed)
 Pt walked in to drop off heart monitor for Dr. Lavonne Prairie.  Msg sent to Triage via Teams - monitor left in 5th floor [address].  JB, 11-23-23

## 2023-11-24 DIAGNOSIS — I872 Venous insufficiency (chronic) (peripheral): Secondary | ICD-10-CM | POA: Diagnosis not present

## 2023-11-24 DIAGNOSIS — N1831 Chronic kidney disease, stage 3a: Secondary | ICD-10-CM | POA: Diagnosis not present

## 2023-11-24 DIAGNOSIS — Z1211 Encounter for screening for malignant neoplasm of colon: Secondary | ICD-10-CM | POA: Diagnosis not present

## 2023-11-24 DIAGNOSIS — N3281 Overactive bladder: Secondary | ICD-10-CM | POA: Diagnosis not present

## 2023-11-24 DIAGNOSIS — I129 Hypertensive chronic kidney disease with stage 1 through stage 4 chronic kidney disease, or unspecified chronic kidney disease: Secondary | ICD-10-CM | POA: Diagnosis not present

## 2023-11-24 DIAGNOSIS — Z8673 Personal history of transient ischemic attack (TIA), and cerebral infarction without residual deficits: Secondary | ICD-10-CM | POA: Diagnosis not present

## 2023-11-24 DIAGNOSIS — R600 Localized edema: Secondary | ICD-10-CM | POA: Diagnosis not present

## 2023-11-25 ENCOUNTER — Other Ambulatory Visit: Payer: Medicare PPO

## 2023-11-25 ENCOUNTER — Telehealth: Payer: Self-pay | Admitting: Cardiology

## 2023-11-25 NOTE — Telephone Encounter (Signed)
 Pt c/o swelling: STAT is pt has developed SOB within 24 hours  How much weight have you gained and in what time span? About 15lbs   If swelling, where is the swelling located? Around ankles and feet, pt is unable to swear her shoes.   Are you currently taking a fluid pill? Yes  Are you currently SOB? No  Do you have a log of your daily weights (if so, list)? No  Have you gained 3 pounds in a day or 5 pounds in a week? Yes   Have you traveled recently? No

## 2023-11-25 NOTE — Telephone Encounter (Signed)
 Spoke with pt who is reporting a 20 lb wt gain though she is not sure in what amount of time.  She states she does not weigh daily.  Feet and ankles are swollen to where she can not wear shoes.  She denies any shortness of breath but also states she doesn't do too much because she needs a knee replacement. She reports she wears knee high compression stockings daily and keeps feet elevated during the day as much as possible.  She reports eating a heart healthy diet, avoids prepared and fast foods - uses [employer].  Grandson cooks at home mostly.   She reports seeing her PCP yesterday and her wt was up 20 lbs-having to wear flip flops.  She states her triamterene /hydrochlorothiazide  was discontinued and she was told to increase Losartan/hydrochlorothiazide  50/12.5 mg to 2 tablets a day.  Advised pt to contact her PCP reguarding current need for treatment r/t her c/o edema.  She has not been seen here by Dr Lavonne Prairie since 11/2021 and will need to come into the office for further evaluation.  She is aware she will be contacted with an appt.

## 2023-11-29 LAB — COLOGUARD: COLOGUARD: NEGATIVE

## 2023-11-30 ENCOUNTER — Emergency Department (HOSPITAL_COMMUNITY)

## 2023-11-30 ENCOUNTER — Other Ambulatory Visit: Payer: Self-pay

## 2023-11-30 ENCOUNTER — Encounter (HOSPITAL_COMMUNITY): Payer: Self-pay | Admitting: *Deleted

## 2023-11-30 ENCOUNTER — Emergency Department (HOSPITAL_COMMUNITY)
Admission: EM | Admit: 2023-11-30 | Discharge: 2023-11-30 | Disposition: A | Discharge status: Home / Self Care | Attending: Student | Admitting: Student

## 2023-11-30 DIAGNOSIS — R0602 Shortness of breath: Secondary | ICD-10-CM | POA: Insufficient documentation

## 2023-11-30 DIAGNOSIS — R55 Syncope and collapse: Secondary | ICD-10-CM

## 2023-11-30 DIAGNOSIS — Z79899 Other long term (current) drug therapy: Secondary | ICD-10-CM | POA: Diagnosis not present

## 2023-11-30 DIAGNOSIS — I129 Hypertensive chronic kidney disease with stage 1 through stage 4 chronic kidney disease, or unspecified chronic kidney disease: Secondary | ICD-10-CM | POA: Diagnosis not present

## 2023-11-30 DIAGNOSIS — Z7901 Long term (current) use of anticoagulants: Secondary | ICD-10-CM | POA: Insufficient documentation

## 2023-11-30 DIAGNOSIS — N1831 Chronic kidney disease, stage 3a: Secondary | ICD-10-CM | POA: Insufficient documentation

## 2023-11-30 DIAGNOSIS — R0600 Dyspnea, unspecified: Secondary | ICD-10-CM | POA: Diagnosis not present

## 2023-11-30 DIAGNOSIS — E860 Dehydration: Secondary | ICD-10-CM | POA: Insufficient documentation

## 2023-11-30 DIAGNOSIS — I959 Hypotension, unspecified: Secondary | ICD-10-CM | POA: Diagnosis not present

## 2023-11-30 DIAGNOSIS — Z85828 Personal history of other malignant neoplasm of skin: Secondary | ICD-10-CM | POA: Insufficient documentation

## 2023-11-30 DIAGNOSIS — R0989 Other specified symptoms and signs involving the circulatory and respiratory systems: Secondary | ICD-10-CM | POA: Diagnosis not present

## 2023-11-30 LAB — COMPREHENSIVE METABOLIC PANEL WITH GFR
ALT: 21 U/L (ref 0–44)
AST: 28 U/L (ref 15–41)
Albumin: 4.2 g/dL (ref 3.5–5.0)
Alkaline Phosphatase: 54 U/L (ref 38–126)
Anion gap: 11 (ref 5–15)
BUN: 35 mg/dL — ABNORMAL HIGH (ref 8–23)
CO2: 25 mmol/L (ref 22–32)
Calcium: 9.5 mg/dL (ref 8.9–10.3)
Chloride: 103 mmol/L (ref 98–111)
Creatinine, Ser: 1.35 mg/dL — ABNORMAL HIGH (ref 0.44–1.00)
GFR, Estimated: 39 mL/min — ABNORMAL LOW (ref >60)
Glucose, Bld: 130 mg/dL — ABNORMAL HIGH (ref 70–99)
Potassium: 3.6 mmol/L (ref 3.5–5.1)
Sodium: 139 mmol/L (ref 135–145)
Total Bilirubin: 0.5 mg/dL (ref 0.0–1.2)
Total Protein: 6.9 g/dL (ref 6.5–8.1)

## 2023-11-30 LAB — CBC
HCT: 38.2 % (ref 36.0–46.0)
Hemoglobin: 12.4 g/dL (ref 12.0–15.0)
MCH: 33.9 pg (ref 26.0–34.0)
MCHC: 32.5 g/dL (ref 30.0–36.0)
MCV: 104.4 fL — ABNORMAL HIGH (ref 80.0–100.0)
Platelets: 250 10*3/uL (ref 150–400)
RBC: 3.66 MIL/uL — ABNORMAL LOW (ref 3.87–5.11)
RDW: 13.3 % (ref 11.5–15.5)
WBC: 7.8 10*3/uL (ref 4.0–10.5)
nRBC: 0 % (ref 0.0–0.2)

## 2023-11-30 LAB — TROPONIN I (HIGH SENSITIVITY): Troponin I (High Sensitivity): 4 ng/L (ref <18)

## 2023-11-30 LAB — CBG MONITORING, ED: Glucose-Capillary: 139 mg/dL — ABNORMAL HIGH (ref 70–99)

## 2023-11-30 LAB — BRAIN NATRIURETIC PEPTIDE: B Natriuretic Peptide: 23.7 pg/mL (ref 0.0–100.0)

## 2023-11-30 MED ORDER — LACTATED RINGERS IV BOLUS
1000.0000 mL | Freq: Once | INTRAVENOUS | Status: AC
  Administered 2023-11-30: 1000 mL via INTRAVENOUS

## 2023-11-30 NOTE — ED Provider Triage Note (Signed)
 Emergency Medicine Provider Triage Evaluation Note  Ebony [name] , a 83 y.o. female  was evaluated in triage.  Pt complains of syncope.  Occurred about an hour and a half ago.  Patient was eating lunch and then passed out.  Did not have chest pain.  Witnesses states she was unconscious for about a minute.  Vomit once after regaining consciousness.  Does not believe that she hit her head.  Takes Plavix .  Review of Systems  Positive: See above Negative: See above  Physical Exam  BP (!) 72/61   Pulse 79   Temp 98 F (36.7 C) (Oral)   Resp 16   SpO2 94%  Gen:   Awake, no distress   Resp:  Normal effort  MSK:   Moves extremities without difficulty  Other:    Medical Decision Making  Medically screening exam initiated at 2:48 PM.  Appropriate orders placed.  Ebony [name] was informed that the remainder of the evaluation will be completed by another provider, this initial triage assessment does not replace that evaluation, and the importance of remaining in the ED until their evaluation is complete.  Work up started   Ebony Mcmurray, PA-C 11/30/23 1451

## 2023-11-30 NOTE — ED Triage Notes (Signed)
 Pt arrives by ems as she had a syncopal episode today, she was unconscious for about a minute and had one episode of vomiting once she came to.  VSS for ems.  No CP

## 2023-11-30 NOTE — ED Provider Notes (Signed)
 [place] EMERGENCY DEPARTMENT AT [facility] Provider Note  CSN: [id number] Arrival date & time: 11/30/23 1409  Chief Complaint(s) Loss of Consciousness and Hypotension  HPI Ebony [name] is a 83 y.o. female with PMH basal cell carcinoma, HTN, CVA who presents emergency room for evaluation of a syncopal episode.  Patient states that approximately an hour and a half prior to arrival she was eating lunch when she had a syncopal episode.  She states that she felt lightheaded prior to the event but had no associated chest pain or shortness of breath.  Single episode of vomiting upon regaining consciousness.  Patient is currently on diuretic therapy for leg swelling.  Arrives hypotensive with systolics in the 70s.   Past Medical History Past Medical History:  Diagnosis Date   Arthritis    Cancer (HCC)    BASAL CELL CA   Depression    GERD (gastroesophageal reflux disease)    TAKES TUMS PRN   Hypertension    Osteopenia    Stroke (HCC)    TIA 2004- PT IS ON PLAVIX    Patient Active Problem List   Diagnosis Date Noted   Postmenopausal bleeding 03/17/2023   Compression fracture of L2 (HCC) 02/10/2023   Acute left ICA ischemic stroke (HCC) 11/17/2022   Muscle pain 11/17/2022   Pain of left hip joint 07/10/2022   Mechanical low back pain 03/10/2022   Encounter for counseling 12/31/2021   CVA (cerebral vascular accident) (HCC) 10/08/2021   Spinal stenosis at L4-L5 level 09/12/2021   Hyperlipidemia 04/05/2019   Neck pain 10/01/2017   Acquired trigger finger of left middle finger 09/03/2017   Degenerative joint disease of hand 09/03/2017   Tachycardia 02/04/2016   Dizziness 02/04/2016   Primary osteoarthritis of right knee 11/29/2015   Chronic kidney disease, stage 3a (HCC) 01/18/2015   Essential hypertension 11/22/2014   Gastro-esophageal reflux disease without esophagitis 11/22/2014   Acute medial meniscal tear 10/04/2013   Home Medication(s) Prior to Admission  medications   Medication Sig Start Date End Date Taking? Authorizing Provider  acetaminophen  (TYLENOL ) 500 MG tablet Take 1,000 mg by mouth every 6 (six) hours as needed for headache (pain).    [provider]  albuterol (VENTOLIN HFA) 108 (90 Base) MCG/ACT inhaler Inhale 1-2 puffs into the lungs as needed. 12/24/22   [provider]  allopurinol  (ZYLOPRIM ) 100 MG tablet Take 100 mg by mouth every morning. 11/23/18   [provider]  ALPRAZolam  (XANAX ) 0.5 MG tablet Take 0.5 mg by mouth at bedtime as needed for anxiety or sleep.  07/19/14   [provider]  atorvastatin  (LIPITOR) 10 MG tablet Take 10 mg by mouth See admin instructions. 10 mg once daily in the evening on Monday, Wednesday, Friday. 10/21/22   [provider]  citalopram  (CELEXA ) 20 MG tablet Take 20 mg by mouth every morning. 11/07/15   [provider]  clopidogrel  (PLAVIX ) 75 MG tablet Take 75 mg by mouth every morning.    [provider]  losartan (COZAAR) 25 MG tablet Take 1 tablet by mouth daily. 12/24/22   [provider]  triamterene -hydrochlorothiazide  (DYAZIDE ) 37.5-25 MG capsule Take 1 capsule by mouth daily.    [provider]  Vitamin D, Ergocalciferol, (DRISDOL) 1.25 MG (50000 UNIT) CAPS capsule Take 50,000 Units by mouth every Monday.    [provider]  Past Surgical History Past Surgical History:  Procedure Laterality Date   BUNIONECTOMY Right    CARDIAC CATHETERIZATION     CATARACT EXTRACTION Bilateral    IR KYPHO LUMBAR INC FX REDUCE BONE BX UNI/BIL CANNULATION INC/IMAGING  03/19/2023   IR RADIOLOGIST EVAL & MGMT  04/02/2023   KNEE ARTHROPLASTY Right 11/29/2015   Procedure: RIGHT TOTAL KNEE ARTHROPLASTY ;  Surgeon: Adonica Hoose, MD;  Location: [facility];  Service: Orthopedics;  Laterality: Right;   KNEE  ARTHROSCOPY Right 10/05/2013   Procedure: RIGHT KNEE ARTHROSCOPY WITH medial and lateral DEBRIDEMENT chrondroplasty;  Surgeon: Aurther Blue, MD;  Location: [facility];  Service: Orthopedics;  Laterality: Right;   LOOP RECORDER INSERTION N/A 10/09/2021   Procedure: LOOP RECORDER INSERTION;  Surgeon: Tammie Fall, MD;  Location: [facility] INVASIVE CV LAB;  Service: Cardiovascular;  Laterality: N/A;   LUMBAR LAMINECTOMY/DECOMPRESSION MICRODISCECTOMY N/A 09/12/2021   Procedure: Lumbar decompression Lumbar three - Lumbar four and lumbar four-five;  Surgeon: Orvan Blanch, MD;  Location: [facility] OR;  Service: Orthopedics;  Laterality: N/A;   MOHS SURGERY ON NOSE 01/2007, MOHS ON FACE 08/2012     TONSILLECTOMY     Family History Family History  Problem Relation Age of Onset   Breast cancer Sister     Social History Social History   Tobacco Use   Smoking status: Former    Current packs/day: 0.00    Average packs/day: 1 pack/day for 10.0 years (10.0 ttl pk-yrs)    Types: Cigarettes    Start date: 10/03/1961    Quit date: 10/04/1971    Years since quitting: 52.1   Smokeless tobacco: Never  Vaping Use   Vaping status: Never Used  Substance Use Topics   Alcohol  use: Yes    Comment: OCCAS ALCOHOL    Drug use: No   Allergies Other, Cat hair extract, Codeine, Repatha [evolocumab], Statins, and Sulfa antibiotics  Review of Systems Review of Systems  Neurological:  Positive for syncope.    Physical Exam Vital Signs  I have reviewed the triage vital signs BP (!) 144/72   Pulse (!) 105   Temp 98 F (36.7 C) (Oral)   Resp 16   SpO2 93%   Physical Exam Vitals and nursing note reviewed.  Constitutional:      General: She is not in acute distress.    Appearance: She is well-developed.  HENT:     Head: Normocephalic and atraumatic.  Eyes:     Conjunctiva/sclera: Conjunctivae normal.  Cardiovascular:     Rate and Rhythm: Normal rate and regular rhythm.     Heart sounds: No murmur  heard. Pulmonary:     Effort: Pulmonary effort is normal. No respiratory distress.     Breath sounds: Normal breath sounds.  Abdominal:     Palpations: Abdomen is soft.     Tenderness: There is no abdominal tenderness.  Musculoskeletal:        General: No swelling.     Cervical back: Neck supple.  Skin:    General: Skin is warm and dry.     Capillary Refill: Capillary refill takes less than 2 seconds.  Neurological:     Mental Status: She is alert.     Cranial Nerves: No cranial nerve deficit.     Sensory: No sensory deficit.     Motor: No weakness.  Psychiatric:        Mood and Affect: Mood normal.     ED Results and Treatments Labs (all labs ordered are listed, but  only abnormal results are displayed) Labs Reviewed  CBC - Abnormal; Notable for the following components:      Result Value   RBC 3.66 (*)    MCV 104.4 (*)    All other components within normal limits  COMPREHENSIVE METABOLIC PANEL WITH GFR - Abnormal; Notable for the following components:   Glucose, Bld 130 (*)    BUN 35 (*)    Creatinine, Ser 1.35 (*)    GFR, Estimated 39 (*)    All other components within normal limits  CBG MONITORING, ED - Abnormal; Notable for the following components:   Glucose-Capillary 139 (*)    All other components within normal limits  BRAIN NATRIURETIC PEPTIDE  URINALYSIS, ROUTINE W REFLEX MICROSCOPIC  TROPONIN I (HIGH SENSITIVITY)  TROPONIN I (HIGH SENSITIVITY)                                                                                                                          Radiology CT HEAD WO CONTRAST Result Date: 11/30/2023 CLINICAL DATA:  Syncope/presyncope, cerebrovascular cause suspected EXAM: CT HEAD WITHOUT CONTRAST TECHNIQUE: Contiguous axial images were obtained from the base of the skull through the vertex without intravenous contrast. RADIATION DOSE REDUCTION: This exam was performed according to the departmental dose-optimization program which includes  automated exposure control, adjustment of the mA and/or kV according to patient size and/or use of iterative reconstruction technique. COMPARISON:  November 18, 2022, November 17, 2022 FINDINGS: Brain: Proportional prominence of the ventricles and sulci, consistent with diffuse cerebral parenchymal volume loss. The ventricles otherwise maintained midline position without midline shift. Gray-white differentiation is preserved.Periventricular and subcortical white matter hypoattenuation, most consistent with changes of moderate chronic ischemic microvascular disease.No evidence of acute territorial infarction, extra-axial fluid collection, hemorrhage, or mass lesion. The basilar cisterns are patent without downward herniation. The cerebellar hemispheres and vermis are well formed without mass lesion or focal attenuation abnormality. Small chronic left cerebellar infarct. Vascular: No hyperdense vessel. Calcified atherosclerotic plaque within the cavernous/supraclinoid ICA and intradural vertebral arteries. Skull: Normal. Negative for fracture or focal lesion. Sinuses/Orbits: The paranasal sinuses and mastoids are clear. The globes appear intact. No retrobulbar hematoma. Other: None. IMPRESSION: No acute intracranial abnormality, specifically, no acute hemorrhage, territorial infarction, or intracranial mass. Electronically Signed   By: Rance Burrows M.D.   On: 11/30/2023 17:36   DG Chest Portable 1 View Result Date: 11/30/2023 CLINICAL DATA:  Dyspnea. EXAM: PORTABLE CHEST 1 VIEW COMPARISON:  10/03/2013. FINDINGS: Low lung volume. The bilateral lung fields are clear. Bilateral costophrenic angles are clear. Mildly enlarged cardio-mediastinal silhouette, which is likely accentuated by low lung volume and AP technique. Presumed loop recorder device noted overlying the left lower chest. No acute osseous abnormalities. The soft tissues are within normal limits. IMPRESSION: No active disease. Electronically Signed   By:  Beula Brunswick M.D.   On: 11/30/2023 17:08    Pertinent labs & imaging results that were available during my care of the patient were  reviewed by me and considered in my medical decision making (see MDM for details).  Medications Ordered in ED Medications  lactated ringers  bolus 1,000 mL (1,000 mLs Intravenous New Bag/Given 11/30/23 1452)                                                                                                                                     Procedures .Critical Care  Performed by: Karlyn Overman, MD Authorized by: Karlyn Overman, MD   Critical care provider statement:    Critical care time (minutes):  30   Critical care was necessary to treat or prevent imminent or life-threatening deterioration of the following conditions:  Dehydration   Critical care was time spent personally by me on the following activities:  Development of treatment plan with patient or surrogate, discussions with consultants, evaluation of patient's response to treatment, examination of patient, ordering and review of laboratory studies, ordering and review of radiographic studies, ordering and performing treatments and interventions, pulse oximetry, re-evaluation of patient's condition and review of old charts   (including critical care time)  Medical Decision Making / ED Course   This patient presents to the ED for concern of syncope, this involves an extensive number of treatment options, and is a complaint that carries with it a high risk of complications and morbidity.  The differential diagnosis includes orthostatic syncope, cardiogenic syncope, vasovagal syncope, electrolyte abnormality, dehydration, dysrhythmia, vasovagal, Hypoglycemia, Seizure, Autonomic Insufficiency  MDM: Patient seen emerged part for evaluation of syncopal episode.  Physical exam is largely unremarkable with no focal motor or sensory deficits, no cranial nerve deficits.  Mild pitting edema.  Laboratory  evaluation with a BUN of 35, creatinine 1.35 which is an elevation for this patient but is otherwise unremarkable including negative high-sensitivity troponin and BNP.  CT head and chest x-ray unremarkable.  ECG without evidence of ischemia or dysrhythmia.  Patient fluid resuscitated with significant improvement of her blood pressures.  Suspect patient likely suffering from overdiuresis from her blood pressure medication which caused hypotension and a syncopal episode today.  Orthostatics prior to discharge are reassuring with no hypotension or symptomatic orthostasis.  I encouraged her to hold her hydrochlorothiazide  until she can follow-up with her cardiologist.  At this time she does not meet inpatient criteria for admission and will be discharged with outpatient follow-up.   Additional history obtained: -Additional history obtained from daughter -External records from outside source obtained and reviewed including: Chart review including previous notes, labs, imaging, consultation notes   Lab Tests: -I ordered, reviewed, and interpreted labs.   The pertinent results include:   Labs Reviewed  CBC - Abnormal; Notable for the following components:      Result Value   RBC 3.66 (*)    MCV 104.4 (*)    All other components within normal limits  COMPREHENSIVE METABOLIC PANEL WITH GFR - Abnormal; Notable for the following components:   Glucose, Bld  130 (*)    BUN 35 (*)    Creatinine, Ser 1.35 (*)    GFR, Estimated 39 (*)    All other components within normal limits  CBG MONITORING, ED - Abnormal; Notable for the following components:   Glucose-Capillary 139 (*)    All other components within normal limits  BRAIN NATRIURETIC PEPTIDE  URINALYSIS, ROUTINE W REFLEX MICROSCOPIC  TROPONIN I (HIGH SENSITIVITY)  TROPONIN I (HIGH SENSITIVITY)      EKG   EKG Interpretation Date/Time:  Monday Nov 30 2023 14:49:06 EDT Ventricular Rate:  79 PR Interval:  174 QRS Duration:  96 QT  Interval:  472 QTC Calculation: 542 R Axis:   -55  Text Interpretation: Sinus rhythm Prolonged QT interval Confirmed by Avalynne Diver ([id number]) on 11/30/2023 3:19:40 PM         Imaging Studies ordered: I ordered imaging studies including CT head, chest x-ray I independently visualized and interpreted imaging. I agree with the radiologist interpretation   Medicines ordered and prescription drug management: Meds ordered this encounter  Medications   lactated ringers  bolus 1,000 mL    -I have reviewed the patients home medicines and have made adjustments as needed  Critical interventions Fluid resuscitation   Cardiac Monitoring: The patient was maintained on a cardiac monitor.  I personally viewed and interpreted the cardiac monitored which showed an underlying rhythm of: NSR  Social Determinants of Health:  Factors impacting patients care include: none   Reevaluation: After the interventions noted above, I reevaluated the patient and found that they have :improved  Co morbidities that complicate the patient evaluation  Past Medical History:  Diagnosis Date   Arthritis    Cancer (HCC)    BASAL CELL CA   Depression    GERD (gastroesophageal reflux disease)    TAKES TUMS PRN   Hypertension    Osteopenia    Stroke (HCC)    TIA 2004- PT IS ON PLAVIX       Dispostion: I considered admission for this patient, but at this time she does not meet inpatient criteria for admission and will be discharged with outpatient follow-up     Final Clinical Impression(s) / ED Diagnoses Final diagnoses:  Dehydration  Syncope, unspecified syncope type     @PCDICTATION @    Karlyn Overman, MD 11/30/23 2114

## 2023-12-01 DIAGNOSIS — M1712 Unilateral primary osteoarthritis, left knee: Secondary | ICD-10-CM | POA: Diagnosis not present

## 2023-12-01 NOTE — Progress Notes (Signed)
 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  History of Present Illness Ebony [name] is a 83 y.o. female who returns for post ED follow up. She was last seen in clinic by Rollo Louder, PA-C in 01/2023. She was seen in the [facility] ED 11/30/23 with syncope. Syncope occurred after eating. She felt lightheaded prior to the episode. BP was 70 systolic upon arrival in the ED. EKG done 11/30/23 was personally reviewed by me 12/02/2023 and demonstrated NSR, inf and ant Q waves, prolonged QTc. BNP was normal at 23.7. Hgb was normal at 12.4. Head CT and CXR did not show any acute findings. K+ was normal at 3.6. BUN, Creatinine and BUN/Creatinine ratio were elevated (35, 1.35 and 25.9 respectively). The hsTroponin was neg at 4. She was treated with IV fluids and her diuretic was stopped. She was asked to follow up with Cardiology.   She is here alone. She notes that her PCP recently stopped her losartan  and triamterene /HCTZ and placed on losartan  HCT 50/12.5 mg daily.  She was ultimately told to increase this to 2 tablets daily.  Her syncopal episode occurred at a restaurant after eating.  She felt fine when she went to the restaurant that day.  Shortly after eating, she became lightheaded and does not remember anything else.  She notes that she was told that she vomited after passing out.  She did not require CPR or rescue breathing she has felt fine since.   She has not had chest pain, shortness of breath, orthopnea.  She has chronic ankle pedal edema.  This improves somewhat with elevation and compression.   ROS-See HPI       Studies Reviewed:        Results    Risk Assessment/Calculations:             Physical Exam:   VS:  BP 129/78   Pulse 72   Ht 5' 6 (1.676 m)   Wt 192 lb 3.2 oz (87.2 kg)   SpO2 95%   BMI 31.02 kg/m    Wt Readings from Last 3 Encounters:  12/02/23 192 lb 3.2 oz (87.2 kg)  03/16/23 192 lb 8 oz (87.3 kg)  03/09/23 190 lb (86.2 kg)    Constitutional:      Appearance: Healthy appearance. Not in distress.  Neck:     Vascular: JVD normal.  Pulmonary:     Breath sounds: Normal breath sounds. No wheezing. No rales.  Cardiovascular:     Normal rate. Regular rhythm.     Murmurs: There is a grade 2/6 systolic murmur at the URSB.  Edema:    Peripheral edema present.    Ankle: bilateral trace edema of the ankle.    Feet: bilateral trace edema of the feet. Abdominal:     Palpations: Abdomen is  soft.        Assessment and Plan:   Assessment & Plan Syncope, unspecified syncope type Based upon her blood pressure and lab findings, I suspect her syncope was due to hypovolemia from diuretic use. However, she had no symptoms of fatigue or dizziness prior to going to the restaurant that day. She stopped her Losartan /hydrochlorothiazide  after going to the ED and has not had any dizziness or near syncope since. We looked at her ILR. The battery is almost dead and it is no longer tracking data. However, we were able to pull histograms that do show HRs in the 30-40s at times. She does not take any AV nodal blockers. Overall, I think it would be beneficial to get an echocardiogram to r/o structural heart disease and a 30 day monitor to r/o significant arrhythmias. I would avoid daily diuretics with her her given the scenario she had with her syncope. I do not think she needs to stop driving as there is a likely explanation  for her episode. I have asked her to try to avoid driving until test results are completed.  -Order Echocardiogram  -Order 30 day event monitor -BMET today to follow up on pre-renal azotemia Essential hypertension BP is above target. ARB would be beneficial given chronic kidney disease.  -BMET today -If renal function stable, start back on Losartan  50 mg once daily (no hydrochlorothiazide ) Ankle edema Likely due to venous insufficiency. Given recent syncopal episode, would avoid daily diuretics.  -Wear compression hose -Elevate legs when seated -Consider prn Lasix if edema hard to control -Would avoid Amlodipine for BP given leg edema.          Dispo:  Return in about 3 months (around 03/03/2024) for Routine Follow Up with Dr. Lavona, or Glendia Ferrier, PA-C.  Signed, Glendia Ferrier, PA-C   "

## 2023-12-02 ENCOUNTER — Encounter: Payer: Self-pay | Admitting: Physician Assistant

## 2023-12-02 ENCOUNTER — Ambulatory Visit: Attending: Physician Assistant | Admitting: Physician Assistant

## 2023-12-02 ENCOUNTER — Encounter: Payer: Self-pay | Admitting: *Deleted

## 2023-12-02 VITALS — BP 129/78 | HR 72 | Ht 66.0 in | Wt 192.2 lb

## 2023-12-02 DIAGNOSIS — M25473 Effusion, unspecified ankle: Secondary | ICD-10-CM | POA: Diagnosis not present

## 2023-12-02 DIAGNOSIS — I10 Essential (primary) hypertension: Secondary | ICD-10-CM | POA: Diagnosis not present

## 2023-12-02 DIAGNOSIS — R55 Syncope and collapse: Secondary | ICD-10-CM | POA: Diagnosis not present

## 2023-12-02 NOTE — Patient Instructions (Signed)
 Medication Instructions:  Your physician recommends that you continue on your current medications as directed. Please refer to the Current Medication list given to you today.  *If you need a refill on your cardiac medications before your next appointment, please call your pharmacy*  Lab Work: TODAY:  BMET   If you have labs (blood work) drawn today and your tests are completely normal, you will receive your results only by: MyChart Message (if you have MyChart) OR A paper copy in the mail If you have any lab test that is abnormal or we need to change your treatment, we will call you to review the results.  Testing/Procedures: Your physician has requested that you have an echocardiogram. Echocardiography is a painless test that uses sound waves to create images of your heart. It provides your doctor with information about the size and shape of your heart and how well your heart's chambers and valves are working. This procedure takes approximately one hour. There are no restrictions for this procedure. Please do NOT wear cologne, perfume, aftershave, or lotions (deodorant is allowed). Please arrive 15 minutes prior to your appointment time.  Please note: We ask at that you not bring children with you during ultrasound (echo/ vascular) testing. Due to room size and safety concerns, children are not allowed in the ultrasound rooms during exams. Our front office staff cannot provide observation of children in our lobby area while testing is being conducted. An adult accompanying a patient to their appointment will only be allowed in the ultrasound room at the discretion of the ultrasound technician under special circumstances. We apologize for any inconvenience.   [employer] Cardiac Event Monitor Instructions  Your physician has requested you wear your cardiac event monitor for 30 days, (1-30). [employer] may call or text to confirm a shipping address. The monitor will be sent to a land address  via UPS. [employer] will not ship a monitor to a PO BOX. It typically takes 3-5 days to receive your monitor after it has been enrolled. [employer] will assist with USPS tracking if your package is delayed. The telephone number for [employer] is [phone]. Once you have received your monitor, please review the enclosed instructions. Instruction tutorials can also be viewed under help and settings on the enclosed cell phone. Your monitor has already been registered assigning a specific monitor serial # to you.  Billing and Self Pay Discount Information  [employer] has been provided the insurance information we had on file for you.  If your insurance has been updated, please call [employer] at [phone] to provide them with your updated insurance information.   [employer] offers a discounted Self Pay option for patients who have insurance that does not cover their cardiac event monitor or patients without insurance.  The discounted cost of a Self Pay Cardiac Event Monitor would be $225.00 , if the patient contacts [employer] at [phone] within 7 days of applying the monitor to make payment arrangements.  If the patient does not contact [employer] within 7 days of applying the monitor, the cost of the cardiac event monitor will be $350.00.  Applying the monitor  Remove cell phone from case and turn it on. The cell phone works as IT consultant and needs to be within [employer] of you at all times. The cell phone will need to be charged on a daily basis. We recommend you plug the cell phone into the enclosed charger at your bedside table every night.  Monitor batteries: You will receive two monitor batteries  labelled #1 and #2. These are your recorders. Plug battery #2 onto the second connection on the enclosed charger. Keep one battery on the charger at all times. This will keep the monitor battery deactivated. It will also keep it fully charged for when you need to switch your monitor  batteries. A small light will be blinking on the battery emblem when it is charging. The light on the battery emblem will remain on when the battery is fully charged.  Open package of a Monitor strip. Insert battery #1 into black hood on strip and gently squeeze monitor battery onto connection as indicated in instruction booklet. Set aside while preparing skin.  Choose location for your strip, vertical or horizontal, as indicated in the instruction booklet. Shave to remove all hair from location. There cannot be any lotions, oils, powders, or colognes on skin where monitor is to be applied. Wipe skin clean with enclosed Saline wipe. Dry skin completely.  Peel paper labeled #1 off the back of the Monitor strip exposing the adhesive. Place the monitor on the chest in the vertical or horizontal position shown in the instruction booklet. One arrow on the monitor strip must be pointing upward. Carefully remove paper labeled #2, attaching remainder of strip to your skin. Try not to create any folds or wrinkles in the strip as you apply it.  Firmly press and release the circle in the center of the monitor battery. You will hear a small beep. This is turning the monitor battery on. The heart emblem on the monitor battery will light up every 5 seconds if the monitor battery in turned on and connected to the patient securely. Do not push and hold the circle down as this turns the monitor battery off. The cell phone will locate the monitor battery. A screen will appear on the cell phone checking the connection of your monitor strip. This may read poor connection initially but change to good connection within the next minute. Once your monitor accepts the connection you will hear a series of 3 beeps followed by a climbing crescendo of beeps. A screen will appear on the cell phone showing the two monitor strip placement options. Touch the picture that demonstrates where you applied the monitor  strip.  Your monitor strip and battery are waterproof. You are able to shower, bathe, or swim with the monitor on. They just ask you do not submerge deeper than 3 feet underwater. We recommend removing the monitor if you are swimming in a lake, river, or ocean.  Your monitor battery will need to be switched to a fully charged monitor battery approximately once a week. The cell phone will alert you of an action which needs to be made.  On the cell phone, tap for details to reveal connection status, monitor battery status, and cell phone battery status. The green dots indicates your monitor is in good status. A red dot indicates there is something that needs your attention.  To record a symptom, click the circle on the monitor battery. In 30-60 seconds a list of symptoms will appear on the cell phone. Select your symptom and tap save. Your monitor will record a sustained or significant arrhythmia regardless of you clicking the button. Some patients do not feel the heart rhythm irregularities. [employer] will notify us  of any serious or critical events.  Refer to instruction booklet for instructions on switching batteries, changing strips, the Do not disturb or Pause features, or any additional questions.  Call [employer] at [phone], to  confirm your monitor is transmitting and record your baseline. They will answer any questions you may have regarding the monitor instructions at that time.  Returning the monitor to [employer]  Place all equipment back into blue box. Peel off strip of paper to expose adhesive and close box securely. There is a prepaid UPS shipping label on this box. Drop in a UPS drop box, or at a UPS facility like [employer]. You may also contact [employer] to arrange UPS to pick up monitor package at your home.   Follow-Up: At [facility], you and your health needs are our priority.  As part of our continuing mission to provide you with exceptional heart  care, our providers are all part of one team.  This team includes your primary Cardiologist (physician) and Advanced Practice Providers or APPs (Physician Assistants and Nurse Practitioners) who all work together to provide you with the care you need, when you need it.  Your next appointment:   3 month(s)  Provider:   Eilleen Grates, MD    We recommend signing up for the patient portal called "MyChart".  Sign up information is provided on this After Visit Summary.  MyChart is used to connect with patients for Virtual Visits (Telemedicine).  Patients are able to view lab/test results, encounter notes, upcoming appointments, etc.  Non-urgent messages can be sent to your provider as well.   To learn more about what you can do with MyChart, go to [email or web address].   Other Instructions

## 2023-12-02 NOTE — Assessment & Plan Note (Signed)
 BP is above target. ARB would be beneficial given chronic kidney disease.  -BMET today -If renal function stable, start back on Losartan 50 mg once daily (no hydrochlorothiazide )

## 2023-12-02 NOTE — Progress Notes (Unsigned)
 Patient enrolled for [employer] / [employer] to ship a 30 day cardiac event monitor to her address on file.  Dr. Antoine Poche to read.

## 2023-12-03 ENCOUNTER — Ambulatory Visit: Payer: Self-pay | Admitting: Physician Assistant

## 2023-12-03 DIAGNOSIS — I358 Other nonrheumatic aortic valve disorders: Secondary | ICD-10-CM

## 2023-12-03 DIAGNOSIS — I10 Essential (primary) hypertension: Secondary | ICD-10-CM

## 2023-12-03 DIAGNOSIS — N1831 Chronic kidney disease, stage 3a: Secondary | ICD-10-CM

## 2023-12-03 LAB — BASIC METABOLIC PANEL WITH GFR
BUN/Creatinine Ratio: 25 (ref 12–28)
BUN: 28 mg/dL — ABNORMAL HIGH (ref 8–27)
CO2: 22 mmol/L (ref 20–29)
Calcium: 10.1 mg/dL (ref 8.7–10.3)
Chloride: 100 mmol/L (ref 96–106)
Creatinine, Ser: 1.1 mg/dL — ABNORMAL HIGH (ref 0.57–1.00)
Glucose: 107 mg/dL — ABNORMAL HIGH (ref 70–99)
Potassium: 4.2 mmol/L (ref 3.5–5.2)
Sodium: 142 mmol/L (ref 134–144)
eGFR: 50 mL/min/{1.73_m2} — ABNORMAL LOW (ref >59)

## 2023-12-03 MED ORDER — LOSARTAN POTASSIUM 50 MG PO TABS: 50.0000 mg | ORAL_TABLET | Freq: Every day | ORAL | 3 refills | Status: AC

## 2023-12-04 DIAGNOSIS — M65341 Trigger finger, right ring finger: Secondary | ICD-10-CM | POA: Diagnosis not present

## 2023-12-08 DIAGNOSIS — N3281 Overactive bladder: Secondary | ICD-10-CM | POA: Diagnosis not present

## 2023-12-08 DIAGNOSIS — Z8673 Personal history of transient ischemic attack (TIA), and cerebral infarction without residual deficits: Secondary | ICD-10-CM | POA: Diagnosis not present

## 2023-12-08 DIAGNOSIS — I129 Hypertensive chronic kidney disease with stage 1 through stage 4 chronic kidney disease, or unspecified chronic kidney disease: Secondary | ICD-10-CM | POA: Diagnosis not present

## 2023-12-08 DIAGNOSIS — R55 Syncope and collapse: Secondary | ICD-10-CM | POA: Diagnosis not present

## 2023-12-08 DIAGNOSIS — R600 Localized edema: Secondary | ICD-10-CM | POA: Diagnosis not present

## 2023-12-08 DIAGNOSIS — I872 Venous insufficiency (chronic) (peripheral): Secondary | ICD-10-CM | POA: Diagnosis not present

## 2023-12-08 DIAGNOSIS — N1831 Chronic kidney disease, stage 3a: Secondary | ICD-10-CM | POA: Diagnosis not present

## 2023-12-13 DIAGNOSIS — R55 Syncope and collapse: Secondary | ICD-10-CM | POA: Diagnosis not present

## 2023-12-15 ENCOUNTER — Ambulatory Visit: Payer: Medicare PPO

## 2023-12-18 DIAGNOSIS — N1831 Chronic kidney disease, stage 3a: Secondary | ICD-10-CM | POA: Diagnosis not present

## 2023-12-18 DIAGNOSIS — I10 Essential (primary) hypertension: Secondary | ICD-10-CM | POA: Diagnosis not present

## 2023-12-19 LAB — BASIC METABOLIC PANEL WITH GFR
BUN/Creatinine Ratio: 25 (ref 12–28)
BUN: 24 mg/dL (ref 8–27)
CO2: 22 mmol/L (ref 20–29)
Calcium: 9.3 mg/dL (ref 8.7–10.3)
Chloride: 103 mmol/L (ref 96–106)
Creatinine, Ser: 0.97 mg/dL (ref 0.57–1.00)
Glucose: 142 mg/dL — ABNORMAL HIGH (ref 70–99)
Potassium: 3.8 mmol/L (ref 3.5–5.2)
Sodium: 140 mmol/L (ref 134–144)
eGFR: 58 mL/min/{1.73_m2} — ABNORMAL LOW (ref >59)

## 2023-12-23 ENCOUNTER — Other Ambulatory Visit: Payer: Self-pay | Admitting: Internal Medicine

## 2023-12-23 DIAGNOSIS — Z1231 Encounter for screening mammogram for malignant neoplasm of breast: Secondary | ICD-10-CM

## 2024-01-04 DIAGNOSIS — L309 Dermatitis, unspecified: Secondary | ICD-10-CM | POA: Diagnosis not present

## 2024-01-04 DIAGNOSIS — Z85828 Personal history of other malignant neoplasm of skin: Secondary | ICD-10-CM | POA: Diagnosis not present

## 2024-01-04 DIAGNOSIS — L245 Irritant contact dermatitis due to other chemical products: Secondary | ICD-10-CM | POA: Diagnosis not present

## 2024-01-08 ENCOUNTER — Ambulatory Visit
Admission: RE | Admit: 2024-01-08 | Discharge: 2024-01-08 | Disposition: A | Discharge status: Home / Self Care | Source: Ambulatory Visit | Attending: Internal Medicine | Admitting: Internal Medicine

## 2024-01-08 DIAGNOSIS — Z1231 Encounter for screening mammogram for malignant neoplasm of breast: Secondary | ICD-10-CM

## 2024-01-11 ENCOUNTER — Ambulatory Visit (HOSPITAL_COMMUNITY)
Admission: RE | Admit: 2024-01-11 | Discharge: 2024-01-11 | Disposition: A | Discharge status: Home / Self Care | Source: Ambulatory Visit | Attending: Cardiology | Admitting: Cardiology

## 2024-01-11 DIAGNOSIS — R55 Syncope and collapse: Secondary | ICD-10-CM

## 2024-01-11 LAB — ECHOCARDIOGRAM COMPLETE
Area-P 1/2: 3.95 cm2
P 1/2 time: 612 ms
S' Lateral: 3 cm

## 2024-01-13 DIAGNOSIS — I358 Other nonrheumatic aortic valve disorders: Secondary | ICD-10-CM | POA: Insufficient documentation

## 2024-01-14 ENCOUNTER — Ambulatory Visit: Attending: Physician Assistant

## 2024-01-14 DIAGNOSIS — R55 Syncope and collapse: Secondary | ICD-10-CM

## 2024-01-15 DIAGNOSIS — R55 Syncope and collapse: Secondary | ICD-10-CM | POA: Diagnosis not present

## 2024-01-18 ENCOUNTER — Ambulatory Visit: Payer: Medicare PPO

## 2024-01-18 DIAGNOSIS — M542 Cervicalgia: Secondary | ICD-10-CM | POA: Diagnosis not present

## 2024-01-18 DIAGNOSIS — M25511 Pain in right shoulder: Secondary | ICD-10-CM | POA: Diagnosis not present

## 2024-01-20 NOTE — Progress Notes (Signed)
 Pt has been made aware of normal result and verbalized understanding.  jw

## 2024-01-20 NOTE — Progress Notes (Unsigned)
  Cardiology Office Note:   Date:  01/21/2024  ID:  Ebony [name], DOB 17-Aug-1940, MRN [id number] PCP: Larnell Hamilton, MD  [facility] Providers Cardiologist:  Lynwood Schilling, MD {  History of Present Illness:   Ebony [name] is a 83 y.o. female who I last saw in 2023.  She had a CVA and was in the hospital at that time.   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  TTE did not discover an embolic source.  She had an implanted monitor.   She has had no evidence of atrial fib.    Of note looking at the echocardiogram results she had normal left ventricular function and no significant valvular abnormalities.  She was seen by Hamilton Ferrier PA after syncope earlier this year.   Echo was done and the EF was normal.  There were no significant valvular abnormalities.  There was aortic valve calcium  but no stenosis.  She wore an event monitor that demonstrated no significant or sustained dysrhythmias.  She had a 6 beat run of nonsustained VT and a 7 beat run of SVT but no symptoms related to this.  Since she was seen in the emergency room earlier this year with a syncope and seen in our clinic in follow-up she has had no further cardiovascular complaints.  I did look through the ER records and her creatinine was elevated when she presented and she responded to treatment with a liter of fluid.  She is otherwise felt well and she is hydrating with Gatorade. The patient denies any new symptoms such as chest discomfort, neck or arm discomfort. There has been no new shortness of breath, PND or orthopnea. There have been no reported palpitations, presyncope or syncope.    ROS: As stated in the HPI and negative for all other systems.  Studies Reviewed:    EKG:     NA  Risk Assessment/Calculations:              Physical Exam:   VS:  BP 132/84   Pulse 78   Ht 5' 6 (1.676 m)   Wt 187 lb 14.4 oz (85.2 kg)   SpO2 93%   BMI  30.33 kg/m    Wt Readings from Last 3 Encounters:  01/21/24 187 lb 14.4 oz (85.2 kg)  12/02/23 192 lb 3.2 oz (87.2 kg)  03/16/23 192 lb 8 oz (87.3 kg)     GEN: Well nourished, well developed in no acute distress NECK: No JVD; No carotid bruits CARDIAC: RRR, 2 out of 6 brief apical systolic murmur radiating slightly at the aortic outflow tract, no diastolic murmurs, rubs, gallops RESPIRATORY:  Clear to auscultation without rales, wheezing or rhonchi  ABDOMEN: Soft, non-tender, non-distended EXTREMITIES:  No edema; No deformity   ASSESSMENT AND PLAN:    Syncope: She has had no further episodes and this likely was related to dehydration.  No further workup.  No change in therapy.  HTN: Blood pressure is at target.  No change in therapy.   Follow up with me as needed.   Signed, Lynwood Schilling, MD

## 2024-01-21 ENCOUNTER — Ambulatory Visit: Attending: Cardiology | Admitting: Cardiology

## 2024-01-21 ENCOUNTER — Encounter: Payer: Self-pay | Admitting: Cardiology

## 2024-01-21 VITALS — BP 132/84 | HR 78 | Ht 66.0 in | Wt 187.9 lb

## 2024-01-21 DIAGNOSIS — R55 Syncope and collapse: Secondary | ICD-10-CM | POA: Diagnosis not present

## 2024-01-21 DIAGNOSIS — I10 Essential (primary) hypertension: Secondary | ICD-10-CM

## 2024-01-21 NOTE — Patient Instructions (Signed)

## 2024-02-01 DIAGNOSIS — M25562 Pain in left knee: Secondary | ICD-10-CM | POA: Diagnosis not present

## 2024-02-03 ENCOUNTER — Ambulatory Visit (INDEPENDENT_AMBULATORY_CARE_PROVIDER_SITE_OTHER): Payer: Self-pay | Admitting: Student

## 2024-02-03 DIAGNOSIS — Z719 Counseling, unspecified: Secondary | ICD-10-CM

## 2024-02-03 NOTE — Progress Notes (Signed)
 Patient is an 83 year old female who presents to the clinic today for Moxy laser.  She states that she would like some of her pigment to be addressed and some of her wrinkles addressed as well.  I discussed with her it may help with her wrinkles, but I halo might be a better option for her later down the road to better target them.  She expressed understanding.  Patient also reports that she takes Plavix  at home.  Discussed with her she may be at increased risk of bruising and swelling after the laser.  Patient expressed understanding.  I also discussed the risks of the laser including burning.  Patient expressed understanding.  Preoperative Dx: hyperpigmentation, wrinkles to the face  Postoperative Dx:  same  Procedure: laser to face   Anesthesia: lidocaine  23%, tetracaine 7%  Description of Procedure:  Risks and complications were explained to the patient. Consent was confirmed and signed. Time out was called and all information was confirmed to be correct. The area  area was prepped with alcohol  and wiped dry.   The Moxi laser was set to level 1, at an energy of 5.0 mJ, with a total coverage of 5% and 6 passes. The face was lasered. The patient tolerated the procedure well and there were no complications.  We discussed post laser care.  Recommended that we do a telephone visit next week  to check-in.  Patient expressed understanding.

## 2024-02-15 ENCOUNTER — Ambulatory Visit
Admission: RE | Admit: 2024-02-15 | Discharge: 2024-02-15 | Disposition: A | Discharge status: Home / Self Care | Source: Ambulatory Visit | Attending: Internal Medicine | Admitting: Internal Medicine

## 2024-02-15 DIAGNOSIS — Z1231 Encounter for screening mammogram for malignant neoplasm of breast: Secondary | ICD-10-CM | POA: Diagnosis not present

## 2024-02-18 ENCOUNTER — Other Ambulatory Visit: Payer: Self-pay | Admitting: Internal Medicine

## 2024-02-18 DIAGNOSIS — R928 Other abnormal and inconclusive findings on diagnostic imaging of breast: Secondary | ICD-10-CM

## 2024-02-20 ENCOUNTER — Ambulatory Visit

## 2024-02-20 ENCOUNTER — Ambulatory Visit
Admission: RE | Admit: 2024-02-20 | Discharge: 2024-02-20 | Disposition: A | Discharge status: Home / Self Care | Source: Ambulatory Visit | Attending: Internal Medicine | Admitting: Internal Medicine

## 2024-02-20 DIAGNOSIS — R92321 Mammographic fibroglandular density, right breast: Secondary | ICD-10-CM | POA: Diagnosis not present

## 2024-02-20 DIAGNOSIS — R928 Other abnormal and inconclusive findings on diagnostic imaging of breast: Secondary | ICD-10-CM | POA: Diagnosis not present

## 2024-02-22 ENCOUNTER — Ambulatory Visit: Payer: Medicare PPO

## 2024-02-29 DIAGNOSIS — Z961 Presence of intraocular lens: Secondary | ICD-10-CM | POA: Diagnosis not present

## 2024-02-29 DIAGNOSIS — H524 Presbyopia: Secondary | ICD-10-CM | POA: Diagnosis not present

## 2024-02-29 DIAGNOSIS — H04123 Dry eye syndrome of bilateral lacrimal glands: Secondary | ICD-10-CM | POA: Diagnosis not present

## 2024-03-04 DIAGNOSIS — N3281 Overactive bladder: Secondary | ICD-10-CM | POA: Diagnosis not present

## 2024-03-04 DIAGNOSIS — R202 Paresthesia of skin: Secondary | ICD-10-CM | POA: Diagnosis not present

## 2024-03-04 DIAGNOSIS — R32 Unspecified urinary incontinence: Secondary | ICD-10-CM | POA: Diagnosis not present

## 2024-03-04 DIAGNOSIS — R7302 Impaired glucose tolerance (oral): Secondary | ICD-10-CM | POA: Diagnosis not present

## 2024-03-04 DIAGNOSIS — I10 Essential (primary) hypertension: Secondary | ICD-10-CM | POA: Diagnosis not present

## 2024-03-04 DIAGNOSIS — R600 Localized edema: Secondary | ICD-10-CM | POA: Diagnosis not present

## 2024-03-04 DIAGNOSIS — I872 Venous insufficiency (chronic) (peripheral): Secondary | ICD-10-CM | POA: Diagnosis not present

## 2024-03-09 DIAGNOSIS — Z23 Encounter for immunization: Secondary | ICD-10-CM | POA: Diagnosis not present

## 2024-03-15 DIAGNOSIS — E538 Deficiency of other specified B group vitamins: Secondary | ICD-10-CM | POA: Diagnosis not present

## 2024-03-28 ENCOUNTER — Ambulatory Visit: Payer: Medicare PPO

## 2024-04-06 DIAGNOSIS — E538 Deficiency of other specified B group vitamins: Secondary | ICD-10-CM | POA: Diagnosis not present

## 2024-04-15 ENCOUNTER — Other Ambulatory Visit: Payer: Self-pay

## 2024-04-15 ENCOUNTER — Emergency Department (HOSPITAL_COMMUNITY)
Admission: EM | Admit: 2024-04-15 | Discharge: 2024-04-16 | Disposition: A | Discharge status: Home / Self Care | Attending: Emergency Medicine | Admitting: Emergency Medicine

## 2024-04-15 ENCOUNTER — Encounter (HOSPITAL_COMMUNITY): Payer: Self-pay

## 2024-04-15 DIAGNOSIS — R1110 Vomiting, unspecified: Secondary | ICD-10-CM | POA: Diagnosis not present

## 2024-04-15 DIAGNOSIS — R231 Pallor: Secondary | ICD-10-CM | POA: Diagnosis not present

## 2024-04-15 DIAGNOSIS — R112 Nausea with vomiting, unspecified: Secondary | ICD-10-CM | POA: Diagnosis not present

## 2024-04-15 DIAGNOSIS — R55 Syncope and collapse: Secondary | ICD-10-CM | POA: Diagnosis not present

## 2024-04-15 DIAGNOSIS — N183 Chronic kidney disease, stage 3 unspecified: Secondary | ICD-10-CM | POA: Diagnosis not present

## 2024-04-15 DIAGNOSIS — I129 Hypertensive chronic kidney disease with stage 1 through stage 4 chronic kidney disease, or unspecified chronic kidney disease: Secondary | ICD-10-CM | POA: Insufficient documentation

## 2024-04-15 DIAGNOSIS — R1111 Vomiting without nausea: Secondary | ICD-10-CM | POA: Diagnosis not present

## 2024-04-15 DIAGNOSIS — Z79899 Other long term (current) drug therapy: Secondary | ICD-10-CM | POA: Diagnosis not present

## 2024-04-15 DIAGNOSIS — Z87891 Personal history of nicotine dependence: Secondary | ICD-10-CM | POA: Diagnosis not present

## 2024-04-15 DIAGNOSIS — R531 Weakness: Secondary | ICD-10-CM | POA: Diagnosis not present

## 2024-04-15 DIAGNOSIS — Z7901 Long term (current) use of anticoagulants: Secondary | ICD-10-CM | POA: Insufficient documentation

## 2024-04-15 LAB — COMPREHENSIVE METABOLIC PANEL WITH GFR
ALT: 15 U/L (ref 0–44)
AST: 18 U/L (ref 15–41)
Albumin: 3.3 g/dL — ABNORMAL LOW (ref 3.5–5.0)
Alkaline Phosphatase: 47 U/L (ref 38–126)
Anion gap: 10 (ref 5–15)
BUN: 24 mg/dL — ABNORMAL HIGH (ref 8–23)
CO2: 24 mmol/L (ref 22–32)
Calcium: 8.9 mg/dL (ref 8.9–10.3)
Chloride: 104 mmol/L (ref 98–111)
Creatinine, Ser: 1.06 mg/dL — ABNORMAL HIGH (ref 0.44–1.00)
GFR, Estimated: 52 mL/min — ABNORMAL LOW (ref >60)
Glucose, Bld: 156 mg/dL — ABNORMAL HIGH (ref 70–99)
Potassium: 4.2 mmol/L (ref 3.5–5.1)
Sodium: 138 mmol/L (ref 135–145)
Total Bilirubin: 0.4 mg/dL (ref 0.0–1.2)
Total Protein: 5.8 g/dL — ABNORMAL LOW (ref 6.5–8.1)

## 2024-04-15 LAB — URINALYSIS, ROUTINE W REFLEX MICROSCOPIC
Bacteria, UA: NONE SEEN
Bilirubin Urine: NEGATIVE
Glucose, UA: NEGATIVE mg/dL
Hgb urine dipstick: NEGATIVE
Ketones, ur: NEGATIVE mg/dL
Leukocytes,Ua: NEGATIVE
Nitrite: NEGATIVE
Protein, ur: NEGATIVE mg/dL
Specific Gravity, Urine: 1.015 (ref 1.005–1.030)
pH: 5 (ref 5.0–8.0)

## 2024-04-15 LAB — CBC WITH DIFFERENTIAL/PLATELET
Abs Immature Granulocytes: 0.05 K/uL (ref 0.00–0.07)
Basophils Absolute: 0.1 K/uL (ref 0.0–0.1)
Basophils Relative: 1 %
Eosinophils Absolute: 0.2 K/uL (ref 0.0–0.5)
Eosinophils Relative: 2 %
HCT: 38.2 % (ref 36.0–46.0)
Hemoglobin: 12.5 g/dL (ref 12.0–15.0)
Immature Granulocytes: 1 %
Lymphocytes Relative: 13 %
Lymphs Abs: 1.2 K/uL (ref 0.7–4.0)
MCH: 33.9 pg (ref 26.0–34.0)
MCHC: 32.7 g/dL (ref 30.0–36.0)
MCV: 103.5 fL — ABNORMAL HIGH (ref 80.0–100.0)
Monocytes Absolute: 0.9 K/uL (ref 0.1–1.0)
Monocytes Relative: 10 %
Neutro Abs: 6.6 K/uL (ref 1.7–7.7)
Neutrophils Relative %: 73 %
Platelets: 218 K/uL (ref 150–400)
RBC: 3.69 MIL/uL — ABNORMAL LOW (ref 3.87–5.11)
RDW: 12.8 % (ref 11.5–15.5)
WBC: 9 K/uL (ref 4.0–10.5)
nRBC: 0 % (ref 0.0–0.2)

## 2024-04-15 LAB — TROPONIN I (HIGH SENSITIVITY): Troponin I (High Sensitivity): 4 ng/L (ref <18)

## 2024-04-15 LAB — CBG MONITORING, ED: Glucose-Capillary: 181 mg/dL — ABNORMAL HIGH (ref 70–99)

## 2024-04-15 MED ORDER — SODIUM CHLORIDE 0.9 % IV BOLUS
1000.0000 mL | Freq: Once | INTRAVENOUS | Status: AC
  Administered 2024-04-16: 1000 mL via INTRAVENOUS

## 2024-04-15 NOTE — Discharge Instructions (Signed)
 Thank you for letting us  evaluate you today.  Your lab work is unremarkable.  Your troponin/heart enzymes were negative for heart injury.  Your EKG does not show any acute abnormalities.  Your electrolytes are within normal limits.  Your urine does not show any signs of infection.  Please follow-up with PCP regarding symptoms  Return to Emergency Department if you experience altered mentation, dizziness at rest, numbness or weakness in one-sided body, inability to walk, head injury, another passing out spell, chest pain, shortness of breath

## 2024-04-15 NOTE — ED Provider Notes (Signed)
 Ebony [name]   CSN: [id number] Arrival date & time: 04/15/24  2109     Patient presents with: Near Syncope   Ebony [name] is a 83 y.o. female with past medical history of CVA, CKD stage III, HTN, HLD, chronic antiplatelet therapy (Plavix ) presents Emergency Department via EMS for evaluation of diaphoresis, vomiting, possible syncopal episode.  Reports that she was playing dominoes when she started feeling hot all over then became diaphoretic and vomited up her dessert.  Family reports that she possibly was out for a couple seconds.  Was in a chair, did not fall or hit her head.  No seizure like activity nor postictal confusion following incident. EMS provided to 250 mL liter IVF prior to arrival. No new medications or changes in dosages.  No complaints prior to today, chest pain, shortness of breath, dizziness, cough, NVD.  By arrival to ED, patient has no complaints.  Of [name], had similar episode 11/30/23 with complaints of syncope but was hypotensive. Normal cardiac workup and head CT.    Near Syncope      Prior to Admission medications   Medication Sig Start Date End Date Taking? Authorizing Provider  allopurinol  (ZYLOPRIM ) 100 MG tablet Take 100 mg by mouth every morning. 11/23/18   [provider]  ALPRAZolam  (XANAX ) 0.5 MG tablet Take 0.5 mg by mouth at bedtime as needed for anxiety or sleep.  07/19/14   [provider]  atorvastatin  (LIPITOR) 10 MG tablet Take 10 mg by mouth See admin instructions. 10 mg once daily in the evening on Monday, Wednesday, Friday. 10/21/22   [provider]  citalopram  (CELEXA ) 20 MG tablet Take 20 mg by mouth every morning. 11/07/15   [provider]  clopidogrel  (PLAVIX ) 75 MG tablet Take 75 mg by mouth every morning.    [provider]  losartan  (COZAAR ) 50 MG tablet Take 1 tablet (50 mg total) by mouth daily. 12/03/23   Lelon Hamilton T, PA-C   Vitamin D, Ergocalciferol, (DRISDOL) 1.25 MG (50000 UNIT) CAPS capsule Take 50,000 Units by mouth every Monday.    [provider]    Allergies: Other, Cat hair extract, Codeine, Repatha [evolocumab], Statins, and Sulfa antibiotics    Review of Systems  Cardiovascular:  Positive for near-syncope.    Updated Vital Signs BP 128/64   Pulse 63   Temp 98 F (36.7 C) (Oral)   Resp (!) 22   SpO2 100%   Physical Exam Vitals and nursing [name] reviewed.  Constitutional:      General: She is not in acute distress.    Appearance: Normal appearance.  HENT:     Head: Normocephalic and atraumatic.  Eyes:     Conjunctiva/sclera: Conjunctivae normal.  Cardiovascular:     Rate and Rhythm: Normal rate.  Pulmonary:     Effort: Pulmonary effort is normal. No respiratory distress.  Musculoskeletal:     Right lower leg: No edema.     Left lower leg: No edema.  Skin:    General: Skin is warm.     Coloration: Skin is not jaundiced or pale.  Neurological:     General: No focal deficit present.     Mental Status: She is alert and oriented to person, place, and time. Mental status is at baseline.     Cranial Nerves: No cranial nerve deficit.     Sensory: No sensory deficit.     Motor: No weakness.  Coordination: Coordination normal.     Comments: No aphasia nor slurred speech.  No dizziness.  Motor 5/5 and sensation 2/2 BUE and BLE.  Following commands appropriately.     (all labs ordered are listed, but only abnormal results are displayed) Labs Reviewed  CBC WITH DIFFERENTIAL/PLATELET - Abnormal; Notable for the following components:      Result Value   RBC 3.69 (*)    MCV 103.5 (*)    All other components within normal limits  COMPREHENSIVE METABOLIC PANEL WITH GFR - Abnormal; Notable for the following components:   Glucose, Bld 156 (*)    BUN 24 (*)    Creatinine, Ser 1.06 (*)    Total Protein 5.8 (*)    Albumin 3.3 (*)    GFR, Estimated 52 (*)    All other components  within normal limits  CBG MONITORING, ED - Abnormal; Notable for the following components:   Glucose-Capillary 181 (*)    All other components within normal limits  URINALYSIS, ROUTINE W REFLEX MICROSCOPIC  TROPONIN I (HIGH SENSITIVITY)  TROPONIN I (HIGH SENSITIVITY)    EKG: None  Radiology: No results found.   Medications Ordered in the ED - No data to display                                  Medical Decision Making Amount and/or Complexity of Data Reviewed Labs: ordered.   Patient presents to the ED for concern of presyncope, diaphoresis, 1 episode of vomiting, this involves an extensive number of treatment options, and is a complaint that carries with it a high risk of complications and morbidity.  The differential diagnosis includes electro abnormality, ACS, vasovagal, dehydration, UTI, gastroenteritis, food poisoning   Co morbidities that complicate the patient evaluation  See HPI   Additional history obtained:  Additional history obtained from EMS and Nursing   External records from outside source obtained and reviewed including triage [name], EMS [name]   Lab Tests:  I Ordered, and personally interpreted labs.  The pertinent results include:  CBG 181 Chest x-ray    Cardiac Monitoring:  The patient was maintained on a cardiac monitor.  I personally viewed and interpreted the cardiac monitored which showed an underlying rhythm of: NSR with no ST no T wave abnormalities    Problem List / ED Course:  Near syncope Prodrome prior to near syncope Was witnessed by family.  Denies head injury and patient remained in chair Family reports was vomiting then had syncopal episode last lasted a couple seconds.  No postictal confusion nor seizure-like activity following Currently neurologically intact with no complaints of dizziness, paresthesia, numbness.  No motor or sensory deficits. A&Ox3. Low suspicion for CVA/TIA Has remained hemodynamically stable with no  current complaints Plan to obtain orthostatics and ambulate patient Symptoms seem consistent with vasovagal syncope  Diaphoresis  Vomiting  No abdominal pain.  No diarrhea No episodes of vomiting here in Emergency Department.  Denies current nausea As patient is a female, will obtain cardiac workup for atypical ACS symptoms. Fortunately this is negative Pending po challenge   Reevaluation:  After the interventions noted above, I reevaluated the patient and found that they have :resolved   Social Determinants of Health:  Former tobacco abuse   Dispostion:  Dispo pending 2nd troponin, UA results, ambulation trial, PO challenge, IVF, and orthostatics. Assuming these are unremarkable and patient remains asymptomatic, patient can be discharged with PCP  follow up  Sign out to [facility] pending completion of ED workup.   Final diagnoses:  Near syncope  Vomiting, unspecified vomiting type, unspecified whether nausea present    ED Discharge Orders     None        Minnie Tinnie BRAVO, PA 04/15/24 2350    Lenor Hollering, MD 04/18/24 0700

## 2024-04-15 NOTE — ED Triage Notes (Signed)
 Patient is coming from home. Patient was playing dominos when she starting feeling hot all over then starting to become diaphoretic and had one episode of emesis. EMS 12 lead unremarkable. Patient stayed Aox4 throughout with EMS. Given of fluids. No complaints of nausea, SHOB, chest pain, or dizziness at this time.  EMS VS 176 CBG 132/84 BP

## 2024-04-15 NOTE — ED Notes (Signed)
 Patient was able to ambulate from room to restroom with one person assist. Patient had a steady gait and did not complain of shortness of breath, lightheadedness, or dizziness.

## 2024-04-15 NOTE — ED Notes (Signed)
 Unable to pull labs from IV. IV is patent and flushed. No sign of infiltration.

## 2024-04-16 LAB — TROPONIN I (HIGH SENSITIVITY): Troponin I (High Sensitivity): 6 ng/L (ref <18)

## 2024-04-16 NOTE — ED Provider Notes (Signed)
  Physical Exam  BP (!) 141/67   Pulse 82   Temp 98 F (36.7 C) (Oral)   Resp (!) 25   SpO2 98%   Physical Exam  Procedures  Procedures  ED Course / MDM    Medical Decision Making Amount and/or Complexity of Data Reviewed Labs: ordered.   Patient care assumed at shift handoff.  See previous providers note for full details.  In short, 83 year old female patient with past medical history significant for CVA, stage III CKD, hypertension presented due to a presyncopal episode.  Prior to my assumption of care patient had had a reassuring workup and was eager to discharge home.  Plan to check delta troponin and administer a liter of fluid.  Check orthostatics and ambulate patient after treatment.  If reassuring patient can discharge home.   Delta troponin was flat.  Patient administered liter of fluid.  Orthostatics relatively unremarkable.  Patient had no difficulty with ambulation and is eager to discharge home.  Patient stable for discharge home at this time.      Logan Ubaldo NOVAK, PA-C 04/16/24 0127    Theadore Ozell HERO, MD 04/16/24 [phone]

## 2024-04-19 DIAGNOSIS — E538 Deficiency of other specified B group vitamins: Secondary | ICD-10-CM | POA: Diagnosis not present

## 2024-04-27 DIAGNOSIS — Z780 Asymptomatic menopausal state: Secondary | ICD-10-CM | POA: Diagnosis not present

## 2024-04-27 DIAGNOSIS — N952 Postmenopausal atrophic vaginitis: Secondary | ICD-10-CM | POA: Diagnosis not present

## 2024-04-27 DIAGNOSIS — Z01419 Encounter for gynecological examination (general) (routine) without abnormal findings: Secondary | ICD-10-CM | POA: Diagnosis not present

## 2024-04-30 DIAGNOSIS — R42 Dizziness and giddiness: Secondary | ICD-10-CM | POA: Diagnosis not present

## 2024-04-30 DIAGNOSIS — R61 Generalized hyperhidrosis: Secondary | ICD-10-CM | POA: Diagnosis not present

## 2024-04-30 DIAGNOSIS — R000 Tachycardia, unspecified: Secondary | ICD-10-CM | POA: Diagnosis not present

## 2024-05-02 ENCOUNTER — Ambulatory Visit: Payer: Medicare PPO

## 2024-05-03 DIAGNOSIS — M25562 Pain in left knee: Secondary | ICD-10-CM | POA: Diagnosis not present

## 2024-05-04 ENCOUNTER — Other Ambulatory Visit: Payer: Self-pay | Admitting: Orthopedic Surgery

## 2024-05-04 DIAGNOSIS — H02411 Mechanical ptosis of right eyelid: Secondary | ICD-10-CM | POA: Diagnosis not present

## 2024-05-04 DIAGNOSIS — H02421 Myogenic ptosis of right eyelid: Secondary | ICD-10-CM | POA: Diagnosis not present

## 2024-05-04 DIAGNOSIS — H02422 Myogenic ptosis of left eyelid: Secondary | ICD-10-CM | POA: Diagnosis not present

## 2024-05-04 DIAGNOSIS — H0279 Other degenerative disorders of eyelid and periocular area: Secondary | ICD-10-CM | POA: Diagnosis not present

## 2024-05-04 DIAGNOSIS — H02834 Dermatochalasis of left upper eyelid: Secondary | ICD-10-CM | POA: Diagnosis not present

## 2024-05-04 DIAGNOSIS — H02831 Dermatochalasis of right upper eyelid: Secondary | ICD-10-CM | POA: Diagnosis not present

## 2024-05-04 DIAGNOSIS — M25562 Pain in left knee: Secondary | ICD-10-CM

## 2024-05-04 DIAGNOSIS — H02412 Mechanical ptosis of left eyelid: Secondary | ICD-10-CM | POA: Diagnosis not present

## 2024-05-04 DIAGNOSIS — H02413 Mechanical ptosis of bilateral eyelids: Secondary | ICD-10-CM | POA: Diagnosis not present

## 2024-05-04 DIAGNOSIS — H02423 Myogenic ptosis of bilateral eyelids: Secondary | ICD-10-CM | POA: Diagnosis not present

## 2024-05-06 DIAGNOSIS — Z1212 Encounter for screening for malignant neoplasm of rectum: Secondary | ICD-10-CM | POA: Diagnosis not present

## 2024-05-06 DIAGNOSIS — E785 Hyperlipidemia, unspecified: Secondary | ICD-10-CM | POA: Diagnosis not present

## 2024-05-06 DIAGNOSIS — E559 Vitamin D deficiency, unspecified: Secondary | ICD-10-CM | POA: Diagnosis not present

## 2024-05-06 DIAGNOSIS — Z0189 Encounter for other specified special examinations: Secondary | ICD-10-CM | POA: Diagnosis not present

## 2024-05-10 DIAGNOSIS — N1831 Chronic kidney disease, stage 3a: Secondary | ICD-10-CM | POA: Diagnosis not present

## 2024-05-10 DIAGNOSIS — E785 Hyperlipidemia, unspecified: Secondary | ICD-10-CM | POA: Diagnosis not present

## 2024-05-10 DIAGNOSIS — I129 Hypertensive chronic kidney disease with stage 1 through stage 4 chronic kidney disease, or unspecified chronic kidney disease: Secondary | ICD-10-CM | POA: Diagnosis not present

## 2024-05-10 DIAGNOSIS — M109 Gout, unspecified: Secondary | ICD-10-CM | POA: Diagnosis not present

## 2024-05-11 DIAGNOSIS — R7302 Impaired glucose tolerance (oral): Secondary | ICD-10-CM | POA: Diagnosis not present

## 2024-05-13 DIAGNOSIS — Z0000 Encounter for general adult medical examination without abnormal findings: Secondary | ICD-10-CM | POA: Diagnosis not present

## 2024-05-13 DIAGNOSIS — S32020S Wedge compression fracture of second lumbar vertebra, sequela: Secondary | ICD-10-CM | POA: Diagnosis not present

## 2024-05-13 DIAGNOSIS — E785 Hyperlipidemia, unspecified: Secondary | ICD-10-CM | POA: Diagnosis not present

## 2024-05-13 DIAGNOSIS — G720 Drug-induced myopathy: Secondary | ICD-10-CM | POA: Diagnosis not present

## 2024-05-13 DIAGNOSIS — I129 Hypertensive chronic kidney disease with stage 1 through stage 4 chronic kidney disease, or unspecified chronic kidney disease: Secondary | ICD-10-CM | POA: Diagnosis not present

## 2024-05-13 DIAGNOSIS — Z23 Encounter for immunization: Secondary | ICD-10-CM | POA: Diagnosis not present

## 2024-05-13 DIAGNOSIS — R202 Paresthesia of skin: Secondary | ICD-10-CM | POA: Diagnosis not present

## 2024-05-13 DIAGNOSIS — Z1331 Encounter for screening for depression: Secondary | ICD-10-CM | POA: Diagnosis not present

## 2024-05-13 DIAGNOSIS — Z1339 Encounter for screening examination for other mental health and behavioral disorders: Secondary | ICD-10-CM | POA: Diagnosis not present

## 2024-05-13 DIAGNOSIS — Z8673 Personal history of transient ischemic attack (TIA), and cerebral infarction without residual deficits: Secondary | ICD-10-CM | POA: Diagnosis not present

## 2024-05-13 DIAGNOSIS — E538 Deficiency of other specified B group vitamins: Secondary | ICD-10-CM | POA: Diagnosis not present

## 2024-05-13 DIAGNOSIS — N1831 Chronic kidney disease, stage 3a: Secondary | ICD-10-CM | POA: Diagnosis not present

## 2024-05-13 DIAGNOSIS — R1310 Dysphagia, unspecified: Secondary | ICD-10-CM | POA: Diagnosis not present

## 2024-05-20 DIAGNOSIS — M5451 Vertebrogenic low back pain: Secondary | ICD-10-CM | POA: Diagnosis not present

## 2024-05-20 DIAGNOSIS — M4856XA Collapsed vertebra, not elsewhere classified, lumbar region, initial encounter for fracture: Secondary | ICD-10-CM | POA: Diagnosis not present

## 2024-05-20 DIAGNOSIS — M5459 Other low back pain: Secondary | ICD-10-CM | POA: Diagnosis not present

## 2024-05-30 DIAGNOSIS — H53483 Generalized contraction of visual field, bilateral: Secondary | ICD-10-CM | POA: Diagnosis not present

## 2024-06-01 DIAGNOSIS — I129 Hypertensive chronic kidney disease with stage 1 through stage 4 chronic kidney disease, or unspecified chronic kidney disease: Secondary | ICD-10-CM | POA: Diagnosis not present

## 2024-06-01 DIAGNOSIS — R051 Acute cough: Secondary | ICD-10-CM | POA: Diagnosis not present

## 2024-06-01 DIAGNOSIS — J069 Acute upper respiratory infection, unspecified: Secondary | ICD-10-CM | POA: Diagnosis not present

## 2024-06-01 DIAGNOSIS — R000 Tachycardia, unspecified: Secondary | ICD-10-CM | POA: Diagnosis not present

## 2024-06-01 DIAGNOSIS — N1831 Chronic kidney disease, stage 3a: Secondary | ICD-10-CM | POA: Diagnosis not present

## 2024-06-03 DIAGNOSIS — Z85828 Personal history of other malignant neoplasm of skin: Secondary | ICD-10-CM | POA: Diagnosis not present

## 2024-06-03 DIAGNOSIS — D224 Melanocytic nevi of scalp and neck: Secondary | ICD-10-CM | POA: Diagnosis not present

## 2024-06-03 DIAGNOSIS — L570 Actinic keratosis: Secondary | ICD-10-CM | POA: Diagnosis not present

## 2024-06-14 DIAGNOSIS — M5416 Radiculopathy, lumbar region: Secondary | ICD-10-CM | POA: Diagnosis not present

## 2024-07-23 DIAGNOSIS — R55 Syncope and collapse: Secondary | ICD-10-CM | POA: Insufficient documentation

## 2024-07-23 NOTE — Progress Notes (Unsigned)
 " Cardiology Office Note:   Date:  07/25/2024  ID:  Ebony [name], DOB 1941-06-25, MRN [id number] PCP: Larnell Hamilton, MD  [facility] Providers Cardiologist:  Lynwood Schilling, MD {  History of Present Illness:   Ebony [name] is a 84 y.o. female who I saw in 2023.  She had a CVA and was in the hospital at that time.   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  TTE did not discover an embolic source.  She had an implanted monitor.   She has had no evidence of atrial fib.    Of note looking at the echocardiogram results she had normal left ventricular function and no significant valvular abnormalities.  She was seen by Hamilton Ferrier PA after syncope earlier this year.   Echo was done and the EF was normal.  There were no significant valvular abnormalities.  There was aortic valve calcium  but no stenosis.  She wore an event monitor that demonstrated no significant or sustained dysrhythmias.  She had a 6 beat run of nonsustained VT and a 7 beat run of SVT but no symptoms related to this.  I saw her earlier this year after an episode of syncope.  This was thought to be related to dehydration.    She returns for follow up.  She was in the ED last is Sept and was treated with IV hydration.  She had another episode she reports about a month ago.  She says they are always precipitated by severe back pain.  She was playing cards.  She gets back pain.  She says her pain usually starts within the knee and radiates up to her back.  He is hoping to get surgery on her knee.  She will get this discomfort and then she will get lightheaded.  She slumped forward while playing cards and she said they laid her down on the ground.  She did not go to the emergency room that time.  She has had similar to the other episodes where she has had IV hydration.  She does admit to not hydrating very well.  She had an implantable loop with the battery now  expired and there were never any arrhythmias identified.  She wore the monitor as above and did not have any identified arrhythmias related to the syncope.  She is not describing new orthostatic symptoms.  She does not have chest pressure, neck or arm discomfort.  She has had no weight gain or edema.   ROS: As stated in the HPI and negative for all other systems.  Studies Reviewed:    EKG:   EKG Interpretation Date/Time:  Monday July 25 2024 11:16:52 EST Ventricular Rate:  91 PR Interval:  162 QRS Duration:  86 QT Interval:  352 QTC Calculation: 432 R Axis:   -48  Text Interpretation: Normal sinus rhythm Left axis deviation Left anterior fasicular block Low voltage QRS Poor anterior R wave progression When compared with ECG of 15-Apr-2024 21:24, No significant change since last tracing Confirmed by Schilling Rattan ([id number]) on 07/25/2024 11:26:47 AM   Risk Assessment/Calculations:              Physical Exam:   VS:  BP 112/80   Pulse 91   Ht 5' 6 (1.676 m)   Wt 192 lb (87.1 kg)   SpO2 91%   BMI 30.99 kg/m    Wt Readings from Last 3 Encounters:  07/25/24 192 lb (87.1 kg)  01/21/24 187 lb 14.4 oz (85.2 kg)  12/02/23 192 lb 3.2 oz (87.2 kg)     GEN: Well nourished, well developed in no acute distress NECK: No JVD; No carotid bruits CARDIAC: RRR, 2 out of 6 brief apical systolic murmur nonradiating, no diastolic murmurs, rubs, gallops RESPIRATORY:  Clear to auscultation without rales, wheezing or rhonchi  ABDOMEN: Soft, non-tender, non-distended EXTREMITIES:  No edema; No deformity    ASSESSMENT AND PLAN:   Syncope: The etiology of this is probably vagal related to pain.  She has had an extensive workup without other clear etiology.  She has also been thought to have episodic dehydration contributing.  At this point no further cardiac workup is suggested.  We talked about looking for exit strategies to avoid frank syncope.  We talked about laying down if she starts to feel  that pain or gets lightheaded.  She will also stay adequately hydrated.  HTN: Blood pressure is at target.  No change in therapy.  Preoperative clearance: The patient has no high risk findings.  She is thinking of having knee surgery but is not scheduled.  Given the absence of high risk symptoms or physical findings and the fact that knee surgery would not be high risk from a cardiovascular standpoint she would be at acceptable risk for surgery if done in the next 6 months without further cardiovascular testing.     Follow up with us  as needed.  Signed, Lynwood Schilling, MD   "

## 2024-07-25 ENCOUNTER — Encounter: Payer: Self-pay | Admitting: Cardiology

## 2024-07-25 ENCOUNTER — Ambulatory Visit: Admitting: Cardiology

## 2024-07-25 VITALS — BP 112/80 | HR 91 | Ht 66.0 in | Wt 192.0 lb

## 2024-07-25 DIAGNOSIS — I10 Essential (primary) hypertension: Secondary | ICD-10-CM

## 2024-07-25 DIAGNOSIS — R55 Syncope and collapse: Secondary | ICD-10-CM | POA: Diagnosis not present

## 2024-07-25 NOTE — Patient Instructions (Signed)

## 2024-07-27 ENCOUNTER — Telehealth (HOSPITAL_BASED_OUTPATIENT_CLINIC_OR_DEPARTMENT_OTHER): Payer: Self-pay

## 2024-07-27 ENCOUNTER — Telehealth: Payer: Self-pay | Admitting: Cardiology

## 2024-07-27 NOTE — Telephone Encounter (Signed)
"  ° °  Patient Name: Ebony [name]  DOB: June 19, 1941 MRN: [id number]  Primary Cardiologist: Lynwood Schilling, MD  Chart reviewed as part of pre-operative protocol coverage.  Patient was last seen in the office on 07/25/2024 by Dr. Schilling and was cleared for surgery.  Therefore, given past medical history and time since last visit, based on ACC/AHA guidelines, Ebony [name] is at acceptable risk for the planned procedure without further cardiovascular testing.   Patient takes Plavix  for history of stroke.  Recommendations for holding Plavix  prior to surgery should come from managing provider (PCP versus neurology).  I will route this recommendation as well as a note from most recent office visit to the requesting party via Epic fax function and remove from pre-op pool.  Please call with questions.  Damien JAYSON Braver, NP 07/27/2024, 4:42 PM  "

## 2024-07-27 NOTE — Telephone Encounter (Signed)
"  ° °  Pre-operative Risk Assessment    Patient Name: Ebony [name]  DOB: [birth year] MRN: [id number]   Date of last office visit: 07/25/24 with Dr. Lavona  Date of next office visit: NA  Request for Surgical Clearance    Procedure:  left knee arthroplasty   Date of Surgery:  Clearance TBD                                 Surgeon:  Dr. Fidel Surgeon's Group or Practice Name:  [facility] Phone number:  [phone] Fax number:  [phone]   Type of Clearance Requested:   - Medical  - Pharmacy:  Hold Clopidogrel  (Plavix ) not indicated   Type of Anesthesia:  Spinal   Additional requests/questions:    Bonney Augustin JONETTA Delores   07/27/2024, 4:23 PM   "

## 2024-07-27 NOTE — Telephone Encounter (Signed)
 Pt dropped off pre-op paperwork Faxed to [employer]

## 2024-07-28 NOTE — Telephone Encounter (Signed)
 Pt cleared for surgery by Damien Braver, NP and paperwork sent to surgeon's office.

## 2024-08-08 ENCOUNTER — Ambulatory Visit: Admitting: Neurology

## 2024-08-08 ENCOUNTER — Encounter: Payer: Self-pay | Admitting: Neurology

## 2024-08-08 VITALS — BP 130/85 | HR 100 | Ht 66.0 in | Wt 196.0 lb

## 2024-08-08 DIAGNOSIS — Z8673 Personal history of transient ischemic attack (TIA), and cerebral infarction without residual deficits: Secondary | ICD-10-CM

## 2024-08-08 DIAGNOSIS — R269 Unspecified abnormalities of gait and mobility: Secondary | ICD-10-CM | POA: Diagnosis not present

## 2024-08-08 DIAGNOSIS — I69398 Other sequelae of cerebral infarction: Secondary | ICD-10-CM

## 2024-08-08 NOTE — Patient Instructions (Signed)
 I had a long d/w patient about her remote strokes, risk for recurrent stroke/TIAs, personally independently reviewed imaging studies and stroke evaluation results and answered questions.Continue Plavix  75 mg daily for secondary stroke prevention and maintain strict control of hypertension with blood pressure goal below 130/90, diabetes with hemoglobin A1c goal below 6.5% and lipids with LDL cholesterol goal below 70 mg/dL. I also advised the patient to eat a healthy diet with plenty of whole grains, cereals, fruits and vegetables, exercise regularly and maintain ideal body weight.  Check screening lipid profile, hemoglobin A1c, carotid ultrasound and transcranial Doppler studies.  May need to consider adding Praluent or Leqvio.  Lipid profile is not satisfactory.  Patient is neurologically cleared to undergo the replacement surgery so she will need to hold Plavix  for 5 days prior to the procedure restart after procedure 7650 small bowel acceptable for the procedure risks of stroke.  Followup in the future with Greig Forbes, NP in 6 months or call earlier if necessary

## 2024-08-08 NOTE — Progress Notes (Signed)
 " [facility] [address] [place]. [place] [address] [phone]       OFFICE FOLLOW-UP NOTE  Ms. 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  She was last seen in the office for follow-up by Greig Forbes, nurse practitioner on 09/29/2023.  States she is doing well since then.  She has had no recurrent stroke or TIA symptoms.  She remains on Plavix  which she is tolerating well but does bruise easily and has had no bleeding episodes.  Her blood pressure is under good control with systolic ranging between 120-130 range.  On Lipitor but taking only 10 mg daily and tolerating it well.  She does have significant left knee pain and plans to have surgery soon and wants an orthopedist for this.  She has already gotten clearance from her primary care physician and cardiology in the last few weeks.  She does also have chronic back pain and cannot stand for long issues.  She is currently living with her grandson but is mostly independent in activities of daily living.  She was seen in the ER on 11/30/2023 for a syncopal episode.  CT head showed no acute abnormalities.  Subsequently saw cardiologist and had a 30-day heart monitor which was negative for paroxysmal A-fib.  Echocardiogram on 01/14/2024 showed normal ejection fraction with some calcification and thickening of the aortic valve but no significant stenosis.  No new complaints today. UPDATE 03/16/2023 Ebony [name] returns for follow up. She was initially seen by me 05/2022 for right parietal cortex infarct and small vessel ischemia and a small remote left cerebellar infarct. She felt she was mostly back to baseline. She was admitted 11/17/2022 following an event where she started acting  strangely and had difficulty understanding her family. She was laughing inappropriately and eating with her fingers. EMS called and she was mostly non verbal. Speech slowly improved but was clearly still aphasic in ER. She was treated with TNK and symptoms resolved. Speech returned to baseline. Asa added for three weeks then Plavix  alone. Previously on Plavix . LDL 55 on Repatha. A1C 6.5. She was enrolled into Librexia stroke trial. No recommendations from PT/OT eval. She was discharged home 11/19/2022.    Since discharge, she reports that she is doing well. She denies any residual symptoms. BP has been normal at home. Usually around 130/80 on Dyazide . She continues Plavix . No unusual bleeding. She does have some bruising but feels she is due to taking atorvastatin . Dr Larnell started her on atorvastatin , recently. She is taking M,W, F. She is tolerating well so far. She discontinued Repatha. She reports itching with injections. Tolerating Plavix . No unusual bleeding or bruising. She is monitored through cardiology. No events noted on loop recorder thus far. She discontinued Librexia trial. She reports that her grandson lives with her. She has a friend that also lives with her. She continues to drive without difficulty. She was walking but it has been too hot. She plans to resume exercise at gym after her kyphoplasty 03/19/2023.    UPDATE 09/29/2023 ALL: Ebony [name] returns for follow up for CVA. She was initially seen by me 05/2022 for right parietal cortex infarct and small vessel ischemia and a small remote left cerebellar infarct then again in follow up 02/2023 and was doing well.  She has left sided knee pain that impairs gait but feels she is doing well. She uses cane. She continues atorvastatin  M, W and F. She is tolerating it well. BP is usually 120/80. ILR monitoring completed. No irregular rhythms found. She drives without difficulty. Lives with grandson and a friend. Able to manage meds. No concerns of  memory impairment. She tries to stay active around the home.  ROS:   14 system review of systems is positive for backache, knee pain, difficulty walking, bruising and all other systems negative  PMH:  Past Medical History:  Diagnosis Date   Arthritis    Cancer (HCC)    BASAL CELL CA   Depression    GERD (gastroesophageal reflux disease)    TAKES TUMS PRN   Hypertension    Osteopenia    Stroke (HCC)    TIA 2004- PT IS ON PLAVIX     Social History:  Social History   Socioeconomic History   Marital status: Single    Spouse name: Not on file   Number of children: Not on file   Years of education: Not on file   Highest education level: Not on file  Occupational History   Not on file  Tobacco Use   Smoking status: Former    Current packs/day: 0.00    Average packs/day: 1 pack/day for 10.0 years (10.0 ttl pk-yrs)    Types: Cigarettes    Start date: 10/03/1961    Quit date: 10/04/1971    Years since quitting: 52.8   Smokeless tobacco: Never  Vaping Use   Vaping status: Never Used  Substance and Sexual Activity   Alcohol  use: Yes    Comment: OCCAS ALCOHOL    Drug use: No   Sexual activity: Not on file  Other Topics Concern   Not on file  Social History Narrative   Not on file   Social Drivers of Health   Tobacco Use: Medium Risk (08/08/2024)   Patient History    Smoking Tobacco Use: Former    Smokeless Tobacco Use: Never    Passive Exposure: Not on Actuary Strain: Not on file  Food Insecurity: No Food Insecurity (11/07/2021)   Hunger Vital Sign    Worried About Running Out of Food in the Last Year: Never true    Ran Out of Food in the Last Year: Never true  Transportation Needs: No Transportation Needs (11/07/2021)   PRAPARE - Administrator, Civil Service (Medical): No    Lack of Transportation (Non-Medical): No  Physical Activity: Not on file  Stress: Not on file  Social Connections: Not on file  Intimate Partner Violence: Not on  file  Depression (EYV7-0): Not on file  Alcohol  Screen: Not on file  Housing: Not on file  Utilities: Not on file  Health Literacy: Not on file    Medications:  Medications Ordered Prior to Encounter[1]  Allergies:  Allergies[2]  Physical Exam General: Mildly obese elderly Caucasian lady, seated, in no evident distress Head: head normocephalic and atraumatic.  Neck: supple with no carotid or supraclavicular bruits Cardiovascular: regular rate and rhythm, no murmurs Musculoskeletal: no deformity Skin:  no rash/petichiae Vascular:  Normal pulses all extremities Vitals:   08/08/24 1459 08/08/24 1504  BP: (!) 160/87 130/85  Pulse: 100    Neurologic Exam Mental Status: Awake and fully alert. Oriented to place and time. Recent and remote memory intact. Attention span, concentration and fund of knowledge appropriate. Mood and affect appropriate.  Cranial Nerves:  Fundoscopic exam reveals sharp disc margins. Pupils equal, briskly reactive to light. Extraocular movements full without nystagmus. Visual fields full to confrontation. Hearing intact. Facial sensation intact. Face, tongue, palate moves normally and symmetrically.  Motor: Normal bulk and tone. Normal strength in all tested extremity muscles.  Mild weakness of left grip and intrinsic hand muscles.  Diminished fine finger movements on the left.  Always right over left upper extremity. Sensory.: intact to touch ,pinprick .position and vibratory sensation.  Coordination: Rapid alternating movements normal in all extremities. Finger-to-nose and heel-to-shin performed accurately bilaterally. Gait and Station: Arises from chair without difficulty. Stance is normal. Gait demonstrates favoring of the left knee due to pain.  She is using a cane..  Tandem gait not tested.   Reflexes: 1+ and symmetric. Toes downgoing.   NIHSS  0 Modified Rankin  2   ASSESSMENT: 84 year old Caucasian lady with history of right parietal cortical infarct and  November 2023 of cryptogenic etiology and TIA in 2024.  Vascular risk factors of hypertension and hyperlipidemia and prior strokes.     PLAN:I had a long d/w patient about her remote strokes, risk for recurrent stroke/TIAs, personally independently reviewed imaging studies and stroke evaluation results and answered questions.Continue Plavix  75 mg daily for secondary stroke prevention and maintain strict control of hypertension with blood pressure goal below 130/90, diabetes with hemoglobin A1c goal below 6.5% and lipids with LDL cholesterol goal below 70 mg/dL. I also advised the patient to eat a healthy diet with plenty of whole grains, cereals, fruits and vegetables, exercise regularly and maintain ideal body weight.  Check screening lipid profile, hemoglobin A1c, carotid ultrasound and transcranial Doppler studies.  May need to consider adding Praluent or Leqvio.  Lipid profile is not satisfactory.  Patient is neurologically cleared to undergo the replacement surgery so she will need to hold Plavix  for 5 days prior to the procedure restart after procedure 7650 small bowel acceptable for the procedure risks of stroke.  Followup in the future with Amy Lomax, NP in 6 months or call earlier if necessary    I personally spent a total of 35 minutes in the care of the patient today including getting/reviewing separately obtained history, performing a medically appropriate exam/evaluation, counseling and educating, placing orders, referring and communicating with other health care professionals, documenting clinical information in the EHR, independently interpreting results, and coordinating care.      Eather Popp, MD   Note: This document was prepared with digital dictation and possible smart phrase technology. Any transcriptional errors that result from this process are unintentional     [1]  Current Outpatient Medications on File Prior to Visit  Medication Sig Dispense Refill   allopurinol   (ZYLOPRIM ) 100 MG tablet Take 100 mg by mouth every morning.     ALPRAZolam  (XANAX ) 0.5 MG tablet Take 0.5 mg by mouth at bedtime as needed for anxiety or sleep.   0   atorvastatin  (LIPITOR) 10 MG tablet Take 10 mg by mouth See admin instructions. 10 mg once daily in the evening on Monday, Wednesday, Friday.     citalopram  (CELEXA ) 20 MG tablet Take 20 mg by mouth every morning.  1   clopidogrel  (PLAVIX ) 75 MG tablet Take 75 mg by mouth every morning.     losartan  (COZAAR ) 50 MG tablet Take 1 tablet (50 mg total) by mouth daily. 90 tablet 3   Vitamin D, Ergocalciferol, (DRISDOL) 1.25 MG (50000 UNIT) CAPS capsule Take 50,000 Units by mouth every Monday.  No current facility-administered medications on file prior to visit.  [2]  Allergies Allergen Reactions   Other Anaphylaxis    Unknown muscle relaxer   Cat Hair Extract    Codeine Nausea And Vomiting    Other Reaction(s): Unknown   Repatha [Evolocumab] Itching   Statins Other (See Comments)    Myalgias  Pt ok with taking atorvastatin  10mg  M-W-F   Sulfa Antibiotics Other (See Comments)    Red streaks on nose- ?  Cellulitis    "

## 2024-08-09 LAB — LIPID PANEL
Chol/HDL Ratio: 2.4 ratio (ref 0.0–4.4)
Cholesterol, Total: 189 mg/dL (ref 100–199)
HDL: 79 mg/dL
LDL Chol Calc (NIH): 88 mg/dL (ref 0–99)
Triglycerides: 131 mg/dL (ref 0–149)
VLDL Cholesterol Cal: 22 mg/dL (ref 5–40)

## 2024-08-09 LAB — HEMOGLOBIN A1C
Est. average glucose Bld gHb Est-mCnc: 128 mg/dL
Hgb A1c MFr Bld: 6.1 % — ABNORMAL HIGH (ref 4.8–5.6)

## 2024-08-13 ENCOUNTER — Ambulatory Visit: Payer: Self-pay | Admitting: Neurology

## 2024-08-13 ENCOUNTER — Other Ambulatory Visit: Payer: Self-pay | Admitting: Neurology

## 2024-08-13 MED ORDER — ATORVASTATIN CALCIUM 20 MG PO TABS: 20.0000 mg | ORAL_TABLET | Freq: Every day | ORAL | 11 refills | Status: AC

## 2024-08-15 ENCOUNTER — Ambulatory Visit: Admitting: General Practice

## 2024-08-15 ENCOUNTER — Ambulatory Visit (HOSPITAL_COMMUNITY)

## 2024-08-25 ENCOUNTER — Ambulatory Visit (HOSPITAL_COMMUNITY)
Admission: RE | Admit: 2024-08-25 | Discharge: 2024-08-25 | Disposition: A | Source: Ambulatory Visit | Attending: Neurology | Admitting: Neurology

## 2024-08-25 NOTE — Progress Notes (Signed)
 Carotid artery duplex and TCD has been completed. Preliminary results can be found in CV Proc through chart review.   08/25/24 1:57 PM Cathlyn Collet RVT
